# Patient Record
Sex: Female | Born: 1960 | Hispanic: Yes | Marital: Married | Smoking: Never smoker
Health system: Southern US, Community
[De-identification: ages and names within clinical notes are randomized; demographics above are authoritative.]

## PROBLEM LIST (undated history)

## (undated) DIAGNOSIS — K219 Gastro-esophageal reflux disease without esophagitis: Secondary | ICD-10-CM

## (undated) DIAGNOSIS — F32A Depression, unspecified: Secondary | ICD-10-CM

## (undated) DIAGNOSIS — E119 Type 2 diabetes mellitus without complications: Secondary | ICD-10-CM

## (undated) DIAGNOSIS — I10 Essential (primary) hypertension: Secondary | ICD-10-CM

## (undated) DIAGNOSIS — F329 Major depressive disorder, single episode, unspecified: Secondary | ICD-10-CM

## (undated) DIAGNOSIS — F419 Anxiety disorder, unspecified: Secondary | ICD-10-CM

## (undated) DIAGNOSIS — G8929 Other chronic pain: Secondary | ICD-10-CM

## (undated) HISTORY — PX: SHOULDER SURGERY: SHX246

## (undated) HISTORY — PX: WRIST SURGERY: SHX841

---

## 1990-02-08 HISTORY — PX: CHOLECYSTECTOMY: SHX55

## 2004-07-10 ENCOUNTER — Emergency Department: Payer: Self-pay | Admitting: Emergency Medicine

## 2005-03-25 ENCOUNTER — Emergency Department: Payer: Self-pay | Admitting: Emergency Medicine

## 2006-05-24 ENCOUNTER — Ambulatory Visit: Payer: Self-pay

## 2008-05-09 ENCOUNTER — Encounter: Admission: RE | Admit: 2008-05-09 | Discharge: 2008-05-09 | Payer: Self-pay | Admitting: Orthopedic Surgery

## 2010-01-27 ENCOUNTER — Emergency Department: Payer: Self-pay | Admitting: Emergency Medicine

## 2010-04-01 ENCOUNTER — Ambulatory Visit: Payer: Self-pay | Admitting: Family Medicine

## 2010-05-20 ENCOUNTER — Ambulatory Visit: Payer: Self-pay | Admitting: Pain Medicine

## 2010-06-02 ENCOUNTER — Ambulatory Visit: Payer: Self-pay | Admitting: Pain Medicine

## 2010-06-10 ENCOUNTER — Ambulatory Visit: Payer: Self-pay | Admitting: Pain Medicine

## 2010-06-17 ENCOUNTER — Ambulatory Visit: Payer: Self-pay | Admitting: Pain Medicine

## 2010-06-18 ENCOUNTER — Ambulatory Visit: Payer: Self-pay | Admitting: Pain Medicine

## 2010-06-23 ENCOUNTER — Ambulatory Visit: Payer: Self-pay | Admitting: Pain Medicine

## 2010-07-08 ENCOUNTER — Ambulatory Visit: Payer: Self-pay | Admitting: Pain Medicine

## 2010-07-24 ENCOUNTER — Ambulatory Visit: Payer: Self-pay | Admitting: Pain Medicine

## 2010-08-05 ENCOUNTER — Ambulatory Visit: Payer: Self-pay | Admitting: Pain Medicine

## 2010-08-17 ENCOUNTER — Ambulatory Visit: Payer: Self-pay | Admitting: Pain Medicine

## 2010-09-07 ENCOUNTER — Ambulatory Visit: Payer: Self-pay | Admitting: Pain Medicine

## 2010-09-28 ENCOUNTER — Ambulatory Visit: Payer: Self-pay | Admitting: Pain Medicine

## 2010-10-19 ENCOUNTER — Ambulatory Visit: Payer: Self-pay | Admitting: Pain Medicine

## 2010-12-07 ENCOUNTER — Ambulatory Visit: Payer: Self-pay | Admitting: Pain Medicine

## 2011-01-07 ENCOUNTER — Ambulatory Visit: Payer: Self-pay | Admitting: Pain Medicine

## 2011-01-14 ENCOUNTER — Ambulatory Visit: Payer: Self-pay | Admitting: Pain Medicine

## 2011-02-09 HISTORY — PX: SPINAL CORD STIMULATOR IMPLANT: SHX2422

## 2011-02-22 ENCOUNTER — Ambulatory Visit: Payer: Self-pay | Admitting: Pain Medicine

## 2011-02-23 ENCOUNTER — Ambulatory Visit: Payer: Self-pay | Admitting: Pain Medicine

## 2011-03-04 ENCOUNTER — Ambulatory Visit: Payer: Self-pay | Admitting: Pain Medicine

## 2011-03-24 ENCOUNTER — Ambulatory Visit: Payer: Self-pay | Admitting: Pain Medicine

## 2011-04-06 ENCOUNTER — Ambulatory Visit: Payer: Self-pay | Admitting: Family Medicine

## 2011-04-08 ENCOUNTER — Ambulatory Visit: Payer: Self-pay | Admitting: Pain Medicine

## 2011-04-15 ENCOUNTER — Ambulatory Visit: Payer: Self-pay | Admitting: Pain Medicine

## 2011-05-05 ENCOUNTER — Ambulatory Visit: Payer: Self-pay | Admitting: Pain Medicine

## 2011-06-23 ENCOUNTER — Ambulatory Visit: Payer: Self-pay | Admitting: Pain Medicine

## 2011-09-20 ENCOUNTER — Ambulatory Visit: Payer: Self-pay | Admitting: Pain Medicine

## 2011-12-01 ENCOUNTER — Ambulatory Visit: Payer: Self-pay | Admitting: Pain Medicine

## 2011-12-31 ENCOUNTER — Ambulatory Visit: Payer: Self-pay | Admitting: Pain Medicine

## 2012-01-03 ENCOUNTER — Ambulatory Visit: Payer: Self-pay | Admitting: Pain Medicine

## 2012-06-12 ENCOUNTER — Ambulatory Visit: Payer: Self-pay | Admitting: Pain Medicine

## 2012-06-27 ENCOUNTER — Other Ambulatory Visit: Payer: Self-pay | Admitting: Pain Medicine

## 2012-06-27 LAB — MAGNESIUM: Magnesium: 2.2 mg/dL

## 2012-06-27 LAB — BASIC METABOLIC PANEL
Chloride: 106 mmol/L (ref 98–107)
Creatinine: 0.54 mg/dL — ABNORMAL LOW (ref 0.60–1.30)
EGFR (Non-African Amer.): >60
Osmolality: 278 (ref 275–301)

## 2012-11-10 ENCOUNTER — Ambulatory Visit: Payer: Self-pay | Admitting: Pain Medicine

## 2012-11-13 ENCOUNTER — Other Ambulatory Visit: Payer: Self-pay | Admitting: Pain Medicine

## 2012-11-13 LAB — BASIC METABOLIC PANEL
BUN: 11 mg/dL (ref 7–18)
Chloride: 104 mmol/L (ref 98–107)
Creatinine: 0.73 mg/dL (ref 0.60–1.30)
EGFR (Non-African Amer.): >60
Potassium: 4.6 mmol/L (ref 3.5–5.1)
Sodium: 137 mmol/L (ref 136–145)

## 2012-11-13 LAB — MAGNESIUM: Magnesium: 2.2 mg/dL

## 2012-11-16 ENCOUNTER — Ambulatory Visit: Payer: Self-pay | Admitting: Pain Medicine

## 2012-11-20 ENCOUNTER — Ambulatory Visit: Payer: Self-pay

## 2012-12-11 ENCOUNTER — Ambulatory Visit: Payer: Self-pay | Admitting: Pain Medicine

## 2013-01-16 ENCOUNTER — Ambulatory Visit: Payer: Self-pay | Admitting: Pain Medicine

## 2013-03-12 ENCOUNTER — Ambulatory Visit: Payer: Self-pay | Admitting: Pain Medicine

## 2013-03-20 ENCOUNTER — Ambulatory Visit: Payer: Self-pay | Admitting: Pain Medicine

## 2013-03-22 ENCOUNTER — Ambulatory Visit: Payer: Self-pay | Admitting: Pain Medicine

## 2013-04-09 ENCOUNTER — Ambulatory Visit: Payer: Self-pay | Admitting: Pain Medicine

## 2013-04-17 ENCOUNTER — Ambulatory Visit: Payer: Self-pay | Admitting: Pain Medicine

## 2013-05-04 ENCOUNTER — Ambulatory Visit: Payer: Self-pay | Admitting: Pain Medicine

## 2013-05-22 ENCOUNTER — Ambulatory Visit: Payer: Self-pay | Admitting: Pain Medicine

## 2013-06-04 ENCOUNTER — Ambulatory Visit: Payer: Self-pay | Admitting: Pain Medicine

## 2013-06-27 ENCOUNTER — Ambulatory Visit: Payer: Self-pay | Admitting: Pain Medicine

## 2013-08-31 ENCOUNTER — Ambulatory Visit: Payer: Self-pay | Admitting: Pain Medicine

## 2013-12-21 ENCOUNTER — Ambulatory Visit: Payer: Self-pay | Admitting: Pain Medicine

## 2014-01-14 ENCOUNTER — Ambulatory Visit: Payer: Self-pay

## 2014-06-02 NOTE — Op Note (Signed)
PATIENT NAME:  Alyssa Wood, Alyssa Wood MR#:  [id number] DATE OF BIRTH:  Jul 05, 1960  ACCOUNT NUMBER: [id number]   DATE OF PROCEDURE:  02/23/2011  Location: Operating Room Referring Physician: Maia Breslow, MD  Primary Care Physician: Baltazar Apo, MD  Procedure by: Kathlen Brunswick Dossie Arbour, MD   Caseworker: Lucius Conn, RN, Fax [phone]  Note: This is the case of a 54 year old Hispanic female patient with a diagnosis of a right upper extremity complex regional pain syndrome who comes into the hospital today for placement of a permanent right-sided cervical spinal cord stimulator after having had a successful trial.    Procedure(s):  1. Permanent implantation of Cervical Epidural, Single Percutaneous Neurostimulator Lead (8 electrode array). 2. Implantation of Epidural Neurostimulator Generator. 3. Fluoroscopic Needle Guidance 4. Intraoperative Analysis and Programming. 5. Postoperative Analysis and Programming. 6. Moderate Conscious Sedation by the [facility] Anesthesia Team.  Surgeon: Kathlen Brunswick. Dossie Arbour, M.D. Side of implant: Right side Top electrode tip level: C2  Diagnostic Indications: Right upper extremity secondary to a complex regional pain syndrome  Position: Prone.  Prepping solution: DuraPrep Area prepped: The Cervical, thoracic, and lumbosacral areas, were prepped with a broad-spectrum topical antiseptic microbicide. Target area: Cervical Epidural Space, around the C2/C3 vertebral body, for the electrode tip. Insertion site is the T1-3 intervertebral space. Level entered: T2-3 Number of attempts: one  Infection Control: Standard Universal Precautions taken (Respiratory Hygiene/Cough Etiquette; Mouth, nose, eye protection; Hand Hygiene; Personal protective equipment (PPE); safe injection practices; and use of masks and disposable sterile surgical gloves) as recommended by the Department of [place] for Disease Control and Prevention (CDC).  Safety Measures:  Allergies were reviewed. Appropriate site, procedure, and patient were confirmed by following the Joint Commission's Universal Protocol (UP.01.01.01). The patient was asked to confirm marked site and procedure, before commencing. The patient was asked about blood thinners, or active infections, both of which were denied. No attempt was made at seeking any paresthesias. Aspiration looking for blood return was conducted prior to injecting. At no point did we inject any substances, as a needle was being advanced.  Pre-procedure Assessment:  A medical history and physical exam were obtained. Relevant documentation was reviewed and verified. Prior to the procedure, the patient was provided with an Audio CD, as well as written information on the procedure, including side-effects, and possible complications. Under the influence of no sedatives, a verbal, as well as a written informed consent were obtained, after having provided information on the risks and possible complications. To fulfill our ethical and legal obligations, as recommended by the American Medical Association's Code of Ethics, we have provided information to the patient about our clinical impression; the nature and purpose of an available treatment or procedure; the risks and benefits of an available treatment or procedure; alternatives; the risk and benefits of the alternative treatment or procedure; and the risks and benefits of not receiving or undergoing a treatment or procedure. The patient was provided information about the risks and possible complications associated with the procedure. These include, but not limited to, failure to achieve desired goals, infection, bleeding, organ or nerve damage, allergic reactions, paralysis, and death. In addition, the patient was informed that Medicine is not an exact science; therefore, there is also the possibility of unforeseen risks and possible complications that may result in a catastrophic outcome. The  patient indicated having understood very clearly.  We have given the patient no guarantees and we have made no promises. Ample time was given to the patient to ask  questions, all of which were answered, to the patient's satisfaction, before proceeding. The patient understands that by signing our informed consent form, they understand and accept the risks and the fact that it is impossible to predict all possible complications. Baseline vital signs were taken and the medical assessment was completed. Verification of the correct person, correct site (including marking of site), and correct procedure were performed and confirmed by the patient. Baseline vital signs were taken and the initial assessment was completed. Verification of the correct person, correct site (including marking of site), and correct procedure were performed and confirmed by the patient, in the form of a "Time Out".  Monitoring: The patient was monitored in the usual manner, using NIBPM, ECG, and pulse oximetry.  IV Access:  An IV access was obtained and secured.  Analgesia:  Moderate (Conscious) Intravenous sedation: Consent was obtained before administering any sedation. Availability of a responsible, adult driver, and NPO status confirmed. Meaningful verbal contact was maintained, with the patient at all times during the procedure. ASA Sedation Guidelines followed.  Prophylactic Antibiotics: Cefazolin (1st generation cephalosporin) 1 gm IVPB.  Local Anesthesia: Lidocaine 1%. The skin over the procedure site were infiltrated using a 3 ml Luer-Lok syringe with a 0.5 inch, 25-G needle. Deeper tissues were infiltrated using a 3.0 inch, 22-G spinal needle, under fluoroscopic guidance.  Fluoroscopy: The patient was taken to the operative suite, where the patient was placed in position for the procedure, over the fluoroscopy compatible table. Fluoroscopy was manipulated, using "Tunnel Vision Technique", to obtain the best possible view  of the target area, on the affected side. Parallax error was corrected before commencing the procedure. Gabor Racz's "Direction-Depth-Direction" technique was used to introduce the procedural needle under continuous pulsed fluoroscopic guidance. Once the target was reached, antero-posterior and lateral fluoroscopic views were taken to confirm needle placement in two planes. Fluoroscopy time: Please see the patient's chart for details.  Description of the procedure: The procedure site was prepped using a broad-spectrum topical antiseptic. The area was then draped in the usual and standard manner. "Time-out" was performed as per JC Universal Protocol (UP.01.01.01).   A midline incision was made over the spinous processes, and hemostasis attained. The procedural needle was then introduced through the incision using Gabor Racz's "Direction-Depth-Direction" technique, under pulsed fluoroscopic guidance. No attempt was made at seeking a paresthesia. The paramidline approach was used to enter the posterior Cervical epidural space at a 30 degree angle, using "Loss-of-resistance Technique" with 3 ml of PF-NaCl (0.9% NSS) + 0.5 ml of air, in a 5 ml glass syringe, using a "loss-of-bounce technique", at the desired level. Correct needle placement was confirmed in the  antero-posterior and lateral fluoroscopic views. The epidural lead was gently introduced under real-time fluoroscopy, constantly assessing for pain or paresthesias, until the tip was observed to be at the target level, on the side ipsilateral to the pain. Once the target was thought to have been reached, antero-posterior and lateral fluoroscopic views were taken to confirm electrode placement in two planes. Placement was tested until a comfortable stimulation pattern was observed over the usual painful area. Once the patient had assured [place] that the stimulation was in the correct pattern, and distribution, we proceeded to remove the 15-G "Tuohy" epidural needle.  This was done while observing the electrode tip under real-time fluoroscopy to prevent movement. The patient was sedated again, and 1% lidocaine was infiltrated into the buttocks area, in order to create the generator pocket. The extension was tunneled and connected  to the generator. An impedance check was conducted after connecting all sections of the system. Once hemostasis was confirmed, both wounds were closed with Vicryl 2-0 after cleaning them with a solution containing 50:50 hydrogen peroxide and Betadine. Surgical staples were used to close the skin. The wounds were covered with sterile transparent bio-occlusive dressings, to easily assess any evidence of infection in the future.  The patient tolerated the entire procedure well. A repeat set of vitals were taken after the procedure and the patient was kept under observation until discharge criteria was met. The patient was provided with discharge instructions, including a section on how to identify potential problems. Should any problems arise concerning this procedure, the patient was given instructions to immediately contact us, without hesitation. The neurostimulator representative and I, both provided the patient with our Business cards containing our contact telephone numbers, and instructed the patient to contact either one of [place], at any time, should there be any problems or questions. In any case, we plan to contact the patient by telephone for a follow-up status report regarding this interventional procedure.  EBL: 20 mL   Complications: No heme; no paresthesias.  Disposition: Return to clinics in 10-11 days for removal of staples and postoperative evaluation.  Additional Comments/Plan: None.  Equipment used:   1. [employer] Model [phone] (Lead Kit), Lot #[id number]  2. [employer] Accessory Kit Model [id number], Lot [id number]  3. [employer], [place] #97791, Lot #[id number]  1. [employer] Accessory Kit Model [id number], Lot  #[id number]  1. [place] [id number], Serial [id number]  6. [employer] Patient [place] [phone], Serial [id number]   Disclaimer: Medicine is not an Chief Strategy Officer. The only guarantee in medicine is that nothing is guaranteed. It is important to note that the decision to proceed with this intervention was based on the information collected from the patient. The Data and conclusions were drawn from the patient's questionnaire, the interview, and the physical examination. Because the information was provided in large part by the patient, it cannot be guaranteed that it has not been purposely or unconsciously manipulated. Every effort has been made to obtain as much relevant data as possible for this evaluation. It is important to note that the conclusions that lead to this procedure are derived in large part from the available data. Always take into account that the treatment will also be dependent on availability of resources and existing treatment guidelines, considered by other Pain Management Practitioners as being common knowledge and practice, at this time. For Medico-Legal purposes, it is also important to point out that variations in procedural techniques and pharmacological choices are the acceptable norm. The indications, contraindications, technique, and results of the above procedure should only be interpreted and judged by a Board-Certified Interventional Pain Specialist with extensive familiarity and expertise in the same exact procedure and technique, doing otherwise would be inappropriate and unethical.   ____________________________ Kathlen Brunswick. Dossie Arbour, MD fan:drc D: 02/24/2011 16:10:53 ET T: 02/24/2011 16:29:35 ET JOB#: [id number]  cc: Karren Newland A. Dossie Arbour, MD, <Dictator> Vernell Leep, MD Caseworker, Lucius Conn, RN, Fax [phone] Gaspar Cola MD ELECTRONICALLY SIGNED 02/25/2011 6:46

## 2015-01-29 ENCOUNTER — Ambulatory Visit: Payer: Self-pay | Admitting: Pain Medicine

## 2015-07-29 ENCOUNTER — Telehealth: Payer: Self-pay | Admitting: Gastroenterology

## 2015-07-29 NOTE — Telephone Encounter (Signed)
colonoscopy

## 2015-08-19 ENCOUNTER — Other Ambulatory Visit: Payer: Self-pay

## 2015-08-19 ENCOUNTER — Telehealth: Payer: Self-pay

## 2015-08-19 MED ORDER — NA SULFATE-K SULFATE-MG SULF 17.5-3.13-1.6 GM/177ML PO SOLN: 1.0000 | Freq: Once | ORAL | Status: DC

## 2015-08-19 NOTE — Telephone Encounter (Signed)
Gastroenterology Pre-Procedure Review  Request Date: 08/28/2015 Requesting Physician: Dr. Allen Norris    PATIENT REVIEW QUESTIONS: The patient responded to the following health history questions as indicated:    1. Are you having any GI issues? no 2. Do you have a personal history of Polyps? yes (unkown type) 3. Do you have a family history of Colon Cancer or Polyps? no 4. Diabetes Mellitus? no 5. Joint replacements in the past 12 months?no 6. Major health problems in the past 3 months?no 7. Any artificial heart valves, MVP, or defibrillator?no    MEDICATIONS & ALLERGIES:    Patient reports the following regarding taking any anticoagulation/antiplatelet therapy:   Plavix, Coumadin, Eliquis, Xarelto, Lovenox, Pradaxa, Brilinta, or Effient? no Aspirin? no  Patient confirms/reports the following medications:  Current Outpatient Prescriptions  Medication Sig Dispense Refill  . calcium-vitamin D (OSCAL WITH D) 500-200 MG-UNIT tablet Take 1 tablet by mouth.    [name] lisinopril-hydrochlorothiazide (PRINZIDE,ZESTORETIC) 20-12.5 MG tablet Take 1 tablet by mouth daily.    . methylphenidate (RITALIN) 5 MG tablet Take 5 mg by mouth 2 (two) times daily.    . nortriptyline (PAMELOR) 10 MG capsule Take 10 mg by mouth at bedtime.    [name] omeprazole (PRILOSEC) 20 MG capsule Take 20 mg by mouth daily.    . ondansetron (ZOFRAN) 4 MG tablet Take 4 mg by mouth every 8 (eight) hours as needed for nausea or vomiting.     No current facility-administered medications for this visit.    Patient confirms/reports the following allergies:  Allergies  Allergen Reactions  . Trazodone And Nefazodone Anaphylaxis  . Hydrocodone Nausea And Vomiting    No orders of the defined types were placed in this encounter.    AUTHORIZATION INFORMATION Primary Insurance: 1D#: Group #:  Secondary Insurance: 1D#: Group #:  SCHEDULE INFORMATION: Date: 08/28/2015 Time: Location: [facility]

## 2015-08-19 NOTE — Telephone Encounter (Signed)
Scheduled Colonoscopy at [facility] 08/28/2015. Personal history of colonic polyps Z86.010. Please pre-cert.

## 2015-08-21 ENCOUNTER — Encounter: Payer: Self-pay | Admitting: *Deleted

## 2015-08-27 NOTE — Discharge Instructions (Signed)
Anestesia general, adultos, cuidados posteriores °(General Anesthesia, Adult, Care After) °Siga estas instrucciones durante las próximas semanas. Estas indicaciones le proporcionan información acerca de cómo deberá cuidarse después del procedimiento. El médico también podrá darle instrucciones más específicas. El tratamiento se ha planificado de acuerdo a las prácticas médicas actuales, pero a veces se producen problemas. Comuníquese con el médico si tiene algún problema o tiene dudas después del procedimiento. °QUÉ ESPERAR DESPUÉS DEL PROCEDIMIENTO °Después del procedimiento es habitual experimentar: °· Somnolencia. °· Náuseas y vómitos. °INSTRUCCIONES PARA EL CUIDADO EN EL HOGAR °· Durante las primeras 24 horas luego de la anestesia general: °¨ Haga que una persona responsable se quede con usted. °¨ No conduzca un automóvil. Si está solo, no viaje en transporte público. °¨ No beba alcohol. °¨ No tome medicamentos que no le haya recetado su médico. °¨ No firme documentos importantes ni tome decisiones trascendentes. °¨ Puede reanudar su dieta y sus actividades normales según le haya indicado el médico. °· Cambie los vendajes (apósitos) tal como se le indicó. °· Si tiene preguntas o se le presenta algún problema relacionado con la anestesia general, comuníquese con el hospital y pida por el anestesista o anestesiólogo de guardia. °SOLICITE ATENCIÓN MÉDICA SI: °· Tiene náuseas y vómitos durante el día posterior a la anestesia. °· Le aparece una erupción cutánea. °SOLICITE ATENCIÓN MÉDICA DE INMEDIATO SI:  °· Tiene dificultad para respirar. °· Siente dolor en el pecho. °· Tiene algún problema alérgico. °  °Esta información no tiene como fin reemplazar el consejo del médico. Asegúrese de hacerle al médico cualquier pregunta que tenga. °  °Document Released: 01/25/2005 Document Revised: 02/15/2014 °Elsevier Interactive Patient Education ©2016 Elsevier Inc. ° °

## 2015-08-28 ENCOUNTER — Ambulatory Visit
Admission: RE | Admit: 2015-08-28 | Discharge: 2015-08-28 | Disposition: A | Discharge status: Home / Self Care | Payer: Medicare Other | Source: Ambulatory Visit | Attending: Gastroenterology | Admitting: Gastroenterology

## 2015-08-28 ENCOUNTER — Encounter: Payer: Self-pay | Admitting: *Deleted

## 2015-08-28 ENCOUNTER — Ambulatory Visit: Payer: Medicare Other | Admitting: Student in an Organized Health Care Education/Training Program

## 2015-08-28 ENCOUNTER — Encounter
Admission: RE | Disposition: A | Discharge status: Home / Self Care | Payer: Self-pay | Source: Ambulatory Visit | Attending: Gastroenterology

## 2015-08-28 DIAGNOSIS — K219 Gastro-esophageal reflux disease without esophagitis: Secondary | ICD-10-CM | POA: Insufficient documentation

## 2015-08-28 DIAGNOSIS — Z885 Allergy status to narcotic agent status: Secondary | ICD-10-CM | POA: Insufficient documentation

## 2015-08-28 DIAGNOSIS — Z860100 Personal history of colon polyps, unspecified: Secondary | ICD-10-CM | POA: Insufficient documentation

## 2015-08-28 DIAGNOSIS — Z79899 Other long term (current) drug therapy: Secondary | ICD-10-CM | POA: Insufficient documentation

## 2015-08-28 DIAGNOSIS — Z1211 Encounter for screening for malignant neoplasm of colon: Secondary | ICD-10-CM | POA: Insufficient documentation

## 2015-08-28 DIAGNOSIS — K641 Second degree hemorrhoids: Secondary | ICD-10-CM | POA: Diagnosis not present

## 2015-08-28 DIAGNOSIS — F419 Anxiety disorder, unspecified: Secondary | ICD-10-CM | POA: Insufficient documentation

## 2015-08-28 DIAGNOSIS — D123 Benign neoplasm of transverse colon: Secondary | ICD-10-CM | POA: Diagnosis not present

## 2015-08-28 DIAGNOSIS — D125 Benign neoplasm of sigmoid colon: Secondary | ICD-10-CM | POA: Insufficient documentation

## 2015-08-28 DIAGNOSIS — I10 Essential (primary) hypertension: Secondary | ICD-10-CM | POA: Diagnosis not present

## 2015-08-28 HISTORY — DX: Anxiety disorder, unspecified: F41.9

## 2015-08-28 HISTORY — DX: Gastro-esophageal reflux disease without esophagitis: K21.9

## 2015-08-28 HISTORY — DX: Other chronic pain: G89.29

## 2015-08-28 HISTORY — PX: COLONOSCOPY WITH PROPOFOL: SHX5780

## 2015-08-28 HISTORY — DX: Essential (primary) hypertension: I10

## 2015-08-28 HISTORY — PX: POLYPECTOMY: SHX5525

## 2015-08-28 SURGERY — COLONOSCOPY WITH PROPOFOL
Anesthesia: Monitor Anesthesia Care | Wound class: Contaminated

## 2015-08-28 MED ORDER — LIDOCAINE HCL (CARDIAC) 20 MG/ML IV SOLN
INTRAVENOUS | Status: DC | PRN
  Administered 2015-08-28: 50 mg via INTRAVENOUS

## 2015-08-28 MED ORDER — ONDANSETRON HCL 4 MG/2ML IJ SOLN: 4.0000 mg | Freq: Once | INTRAMUSCULAR | Status: DC | PRN

## 2015-08-28 MED ORDER — PROPOFOL 10 MG/ML IV BOLUS
INTRAVENOUS | Status: DC | PRN
  Administered 2015-08-28: 60 mg via INTRAVENOUS
  Administered 2015-08-28 (×3): 20 mg via INTRAVENOUS
  Administered 2015-08-28: 30 mg via INTRAVENOUS
  Administered 2015-08-28: 20 mg via INTRAVENOUS

## 2015-08-28 MED ORDER — ACETAMINOPHEN 160 MG/5ML PO SOLN: 325.0000 mg | ORAL | Status: DC | PRN

## 2015-08-28 MED ORDER — ACETAMINOPHEN 325 MG PO TABS: 325.0000 mg | ORAL_TABLET | ORAL | Status: DC | PRN

## 2015-08-28 MED ORDER — LACTATED RINGERS IV SOLN
INTRAVENOUS | Status: DC
  Administered 2015-08-28: 09:00:00 via INTRAVENOUS

## 2015-08-28 MED ORDER — STERILE WATER FOR IRRIGATION IR SOLN
Status: DC | PRN
  Administered 2015-08-28: 09:00:00

## 2015-08-28 SURGICAL SUPPLY — 23 items
CANISTER SUCT 1200ML W/VALVE (MISCELLANEOUS) ×4 IMPLANT
CLIP HMST 235XBRD CATH ROT (MISCELLANEOUS) IMPLANT
CLIP RESOLUTION 360 11X235 (MISCELLANEOUS)
FCP ESCP3.2XJMB 240X2.8X (MISCELLANEOUS)
FORCEPS BIOP RAD 4 LRG CAP 4 (CUTTING FORCEPS) ×4 IMPLANT
FORCEPS BIOP RJ4 240 W/NDL (MISCELLANEOUS)
FORCEPS ESCP3.2XJMB 240X2.8X (MISCELLANEOUS) IMPLANT
GOWN CVR UNV OPN BCK APRN NK (MISCELLANEOUS) ×4 IMPLANT
GOWN ISOL THUMB LOOP REG UNIV (MISCELLANEOUS) ×4
INJECTOR VARIJECT VIN23 (MISCELLANEOUS) IMPLANT
KIT DEFENDO VALVE AND CONN (KITS) IMPLANT
KIT ENDO PROCEDURE OLY (KITS) ×4 IMPLANT
MARKER SPOT ENDO TATTOO 5ML (MISCELLANEOUS) IMPLANT
PAD GROUND ADULT SPLIT (MISCELLANEOUS) IMPLANT
PROBE APC STR FIRE (PROBE) IMPLANT
RETRIEVER NET ROTH 2.5X230 LF (MISCELLANEOUS) ×4 IMPLANT
SNARE SHORT THROW 13M SML OVAL (MISCELLANEOUS) IMPLANT
SNARE SHORT THROW 30M LRG OVAL (MISCELLANEOUS) IMPLANT
SNARE SNG USE RND 15MM (INSTRUMENTS) IMPLANT
SPOT EX ENDOSCOPIC TATTOO (MISCELLANEOUS)
TRAP ETRAP POLY (MISCELLANEOUS) IMPLANT
VARIJECT INJECTOR VIN23 (MISCELLANEOUS)
WATER STERILE IRR 250ML POUR (IV SOLUTION) ×4 IMPLANT

## 2015-08-28 NOTE — Transfer of Care (Signed)
Immediate Anesthesia Transfer of Care Note  Patient: Alyssa Wood  Procedure(s) Performed: Procedure(s) with comments: COLONOSCOPY WITH PROPOFOL (N/A) - PER PT PLEASE LEAVE LAST NEEDS INTERPRETER POLYPECTOMY  Patient Location: PACU  Anesthesia Type: MAC  Level of Consciousness: awake, alert  and patient cooperative  Airway and Oxygen Therapy: Patient Spontanous Breathing and Patient connected to supplemental oxygen  Post-op Assessment: Post-op Vital signs reviewed, Patient's Cardiovascular Status Stable, Respiratory Function Stable, Patent Airway and No signs of Nausea or vomiting  Post-op Vital Signs: Reviewed and stable  Complications: No apparent anesthesia complications

## 2015-08-28 NOTE — Anesthesia Preprocedure Evaluation (Signed)
Anesthesia Evaluation  Patient identified by MRN, date of birth, ID band Patient awake    Reviewed: Allergy & Precautions, NPO status , Patient's Chart, lab work & pertinent test results  Airway Mallampati: II  TM Distance: >3 FB Neck ROM: Full    Dental no notable dental hx.    Pulmonary neg pulmonary ROS,    Pulmonary exam normal        Cardiovascular hypertension, Pt. on medications Normal cardiovascular exam     Neuro/Psych Anxiety negative neurological ROS     GI/Hepatic Neg liver ROS, GERD  Medicated and Controlled,  Endo/Other  negative endocrine ROS  Renal/GU negative Renal ROS     Musculoskeletal negative musculoskeletal ROS (+)   Abdominal   Peds  Hematology negative hematology ROS (+)   Anesthesia Other Findings   Reproductive/Obstetrics                             Anesthesia Physical Anesthesia Plan  ASA: II  Anesthesia Plan: MAC   Post-op Pain Management:    Induction: Intravenous  Airway Management Planned:   Additional Equipment:   Intra-op Plan:   Post-operative Plan:   Informed Consent: I have reviewed the patients History and Physical, chart, labs and discussed the procedure including the risks, benefits and alternatives for the proposed anesthesia with the patient or authorized representative who has indicated his/her understanding and acceptance.     Plan Discussed with: CRNA  Anesthesia Plan Comments:         Anesthesia Quick Evaluation

## 2015-08-28 NOTE — H&P (Signed)
Alyssa Lame, MD [address] [place], [place] [address] Phone: [phone] Fax : [phone]  Primary Care Physician:  [facility] Primary Gastroenterologist:  Dr. Allen Norris  Pre-Procedure History & Physical: HPI:  Alyssa Wood is a 55 y.o. female is here for an colonoscopy.   Past Medical History  Diagnosis Date  . Hypertension   . GERD (gastroesophageal reflux disease)   . Chronic pain   . Anxiety     Past Surgical History  Procedure Laterality Date  . Cholecystectomy  1992  . Spinal cord stimulator implant Right Jan 2013  . Shoulder surgery Right   . Wrist surgery Right     right hand and wrist reconstruction    Prior to Admission medications   Medication Sig Start Date End Date Taking? Authorizing Provider  calcium-vitamin D (OSCAL WITH D) 500-200 MG-UNIT tablet Take 1 tablet by mouth.   Yes Historical Provider, MD  Cyanocobalamin (VITAMIN B 12 PO) Take 1 tablet by mouth daily.   Yes Historical Provider, MD  lisinopril-hydrochlorothiazide (PRINZIDE,ZESTORETIC) 20-12.5 MG tablet Take 1 tablet by mouth daily.    Yes Historical Provider, MD  Na Sulfate-K Sulfate-Mg Sulf (SUPREP BOWEL PREP KIT) 17.5-3.13-1.6 GM/180ML SOLN Take 1 Dose by mouth once. Please use bowel prep instructions provided 08/19/15  Yes Alyssa Lame, MD  nortriptyline (PAMELOR) 10 MG capsule Take 10 mg by mouth at bedtime.   Yes Historical Provider, MD  omeprazole (PRILOSEC) 20 MG capsule Take 20 mg by mouth 2 (two) times daily.    Yes Historical Provider, MD  ondansetron (ZOFRAN) 4 MG tablet Take 4 mg by mouth every 8 (eight) hours as needed for nausea or vomiting.   Yes Historical Provider, MD  methylphenidate (RITALIN) 5 MG tablet Take 5 mg by mouth 2 (two) times daily. Reported on 08/21/2015    Historical Provider, MD    Allergies as of 08/19/2015 - never reviewed  Allergen Reaction Noted  . Trazodone and nefazodone Anaphylaxis 08/19/2015  . Hydrocodone Nausea And  Vomiting 08/19/2015    History reviewed. No pertinent family history.  Social History   Social History  . Marital Status: Married    Spouse Name: N/A  . Number of Children: N/A  . Years of Education: N/A   Occupational History  . Not on file.   Social History Main Topics  . Smoking status: Never Smoker   . Smokeless tobacco: Never Used  . Alcohol Use: No  . Drug Use: No  . Sexual Activity: Not on file   Other Topics Concern  . Not on file   Social History Narrative    Review of Systems: See HPI, otherwise negative ROS  Physical Exam: BP 125/79 mmHg  Pulse 71  Temp(Src) 97.7 F (36.5 C)  Resp 16  Ht 5' 0.06" (1.526 m)  Wt 146 lb (66.225 kg)  BMI 28.44 kg/m2  SpO2 97% General:   Alert,  pleasant and cooperative in NAD Head:  Normocephalic and atraumatic. Neck:  Supple; no masses or thyromegaly. Lungs:  Clear throughout to auscultation.    Heart:  Regular rate and rhythm. Abdomen:  Soft, nontender and nondistended. Normal bowel sounds, without guarding, and without rebound.   Neurologic:  Alert and  oriented x4;  grossly normal neurologically.  Impression/Plan: [name] is here for an colonoscopy to be performed for history of polyps  Risks, benefits, limitations, and alternatives regarding  colonoscopy have been reviewed with the patient.  Questions have been answered.  All parties agreeable.  Alyssa Lame, MD  08/28/2015, 9:07 AM

## 2015-08-28 NOTE — Anesthesia Procedure Notes (Signed)
Procedure Name: MAC Performed by: Kelin Borum Pre-anesthesia Checklist: Patient identified, Emergency Drugs available, Suction available, Timeout performed and Patient being monitored Patient Re-evaluated:Patient Re-evaluated prior to inductionOxygen Delivery Method: Nasal cannula Placement Confirmation: positive ETCO2       

## 2015-08-28 NOTE — Op Note (Signed)
[facility] Gastroenterology Patient Name: Alyssa Wood Procedure Date: 08/28/2015 9:05 AM MRN: [id number] Account #: [id number] Date of Birth: 1960-03-19 Admit Type: Outpatient Age: 55 Room: [facility] OR ROOM 01 Gender: Female Note Status: Finalized Procedure:            Colonoscopy Indications:          High risk colon cancer surveillance: Personal history                        of colonic polyps Providers:            [name] MD, MD Referring MD:         [facility] (Referring MD) Medicines:            Propofol per Anesthesia Complications:        No immediate complications. Procedure:            Pre-Anesthesia Assessment:                       - Prior to the procedure, a History and Physical was                        performed, and patient medications and allergies were                        reviewed. The patient's tolerance of previous                        anesthesia was also reviewed. The risks and benefits of                        the procedure and the sedation options and risks were                        discussed with the patient. All questions were                        answered, and informed consent was obtained. Prior                        Anticoagulants: The patient has taken no previous                        anticoagulant or antiplatelet agents. ASA Grade                        Assessment: II - A patient with mild systemic disease.                        After reviewing the risks and benefits, the patient was                        deemed in satisfactory condition to undergo the                        procedure.                       After obtaining informed consent, the colonoscope was  passed under direct vision. Throughout the procedure,                        the patient's blood pressure, pulse, and oxygen                        saturations were monitored continuously. The Olympus CF                         H180AL colonoscope (S#: [id number]) was introduced through                        the anus and advanced to the the cecum, identified by                        appendiceal orifice and ileocecal valve. The                        colonoscopy was performed without difficulty. The                        patient tolerated the procedure well. The quality of                        the bowel preparation was poor. Findings:      The perianal and digital rectal examinations were normal.      A 3 mm polyp was found in the transverse colon. The polyp was sessile.       The polyp was removed with a cold biopsy forceps. Resection and       retrieval were complete.      Two sessile polyps were found in the sigmoid colon. The polyps were 4 to       5 mm in size. These polyps were removed with a cold biopsy forceps.       Resection and retrieval were complete.      Non-bleeding internal hemorrhoids were found during retroflexion. The       hemorrhoids were Grade II (internal hemorrhoids that prolapse but reduce       spontaneously). Impression:           - Preparation of the colon was poor.                       - One 3 mm polyp in the transverse colon, removed with                        a cold biopsy forceps. Resected and retrieved.                       - Two 4 to 5 mm polyps in the sigmoid colon, removed                        with a cold biopsy forceps. Resected and retrieved.                       - Non-bleeding internal hemorrhoids. Recommendation:       - Repeat colonoscopy in 5 years for surveillance. Procedure Code(s):    --- Professional ---  45380, Colonoscopy, flexible; with biopsy, single or                        multiple Diagnosis Code(s):    --- Professional ---                       Z86.010, Personal history of colonic polyps                       D12.3, Benign neoplasm of transverse colon (hepatic                        flexure or splenic  flexure)                       D12.5, Benign neoplasm of sigmoid colon CPT copyright 2016 [employer]. All rights reserved. The codes documented in this report are preliminary and upon coder review may  be revised to meet current compliance requirements. [name] MD, MD 08/28/2015 9:36:15 AM This report has been signed electronically. Number of Addenda: 0 Note Initiated On: 08/28/2015 9:05 AM Scope Withdrawal Time: 0 hours 6 minutes 21 seconds  Total Procedure Duration: 0 hours 9 minutes 32 seconds       [facility]

## 2015-08-28 NOTE — Anesthesia Postprocedure Evaluation (Signed)
Anesthesia Post Note  Patient: Alyssa Wood  Procedure(s) Performed: Procedure(s) (LRB): COLONOSCOPY WITH PROPOFOL (N/A) POLYPECTOMY  Patient location during evaluation: PACU Anesthesia Type: MAC Level of consciousness: awake and alert and oriented Pain management: pain level controlled Vital Signs Assessment: post-procedure vital signs reviewed and stable Respiratory status: spontaneous breathing and nonlabored ventilation Cardiovascular status: stable Postop Assessment: no signs of nausea or vomiting and adequate PO intake Anesthetic complications: no    Estill Batten

## 2015-08-29 ENCOUNTER — Encounter: Payer: Self-pay | Admitting: Gastroenterology

## 2015-09-02 ENCOUNTER — Encounter: Payer: Self-pay | Admitting: Gastroenterology

## 2015-09-03 ENCOUNTER — Encounter: Payer: Self-pay | Admitting: Gastroenterology

## 2015-12-15 ENCOUNTER — Other Ambulatory Visit: Payer: Self-pay | Admitting: Family Medicine

## 2015-12-15 DIAGNOSIS — Z0000 Encounter for general adult medical examination without abnormal findings: Secondary | ICD-10-CM

## 2016-01-16 ENCOUNTER — Ambulatory Visit
Admission: RE | Admit: 2016-01-16 | Discharge: 2016-01-16 | Disposition: A | Discharge status: Home / Self Care | Payer: Medicare Other | Source: Ambulatory Visit | Attending: Family Medicine | Admitting: Family Medicine

## 2016-01-16 DIAGNOSIS — Z1231 Encounter for screening mammogram for malignant neoplasm of breast: Secondary | ICD-10-CM | POA: Insufficient documentation

## 2016-01-16 DIAGNOSIS — Z0000 Encounter for general adult medical examination without abnormal findings: Secondary | ICD-10-CM

## 2016-12-11 ENCOUNTER — Emergency Department
Admission: EM | Admit: 2016-12-11 | Discharge: 2016-12-12 | Disposition: A | Discharge status: Home / Self Care | Payer: Medicare Other | Attending: Emergency Medicine | Admitting: Emergency Medicine

## 2016-12-11 DIAGNOSIS — I10 Essential (primary) hypertension: Secondary | ICD-10-CM | POA: Diagnosis not present

## 2016-12-11 DIAGNOSIS — G8929 Other chronic pain: Secondary | ICD-10-CM | POA: Diagnosis not present

## 2016-12-11 DIAGNOSIS — R519 Headache, unspecified: Secondary | ICD-10-CM

## 2016-12-11 DIAGNOSIS — Z79899 Other long term (current) drug therapy: Secondary | ICD-10-CM | POA: Diagnosis not present

## 2016-12-11 DIAGNOSIS — R51 Headache: Secondary | ICD-10-CM | POA: Diagnosis not present

## 2016-12-11 NOTE — ED Notes (Signed)
Per Dr Burlene Arnt no labs or imagining at this time.

## 2016-12-11 NOTE — ED Triage Notes (Addendum)
Pt states that she has severe migraine and nausea that started at 5pm - pt fell asleep during assessment but is stating 10/10 pain - she talks about argument with her husband repeatedly as the reason for the headache

## 2016-12-12 ENCOUNTER — Emergency Department: Payer: Medicare Other

## 2016-12-12 LAB — CBC
HCT: 40 % (ref 35.0–47.0)
HEMOGLOBIN: 13.4 g/dL (ref 12.0–16.0)
MCH: 27.8 pg (ref 26.0–34.0)
MCHC: 33.4 g/dL (ref 32.0–36.0)
MCV: 83.5 fL (ref 80.0–100.0)
Platelets: 242 10*3/uL (ref 150–440)
RBC: 4.8 MIL/uL (ref 3.80–5.20)
RDW: 13.4 % (ref 11.5–14.5)
WBC: 7.9 10*3/uL (ref 3.6–11.0)

## 2016-12-12 LAB — COMPREHENSIVE METABOLIC PANEL
ALT: 40 U/L (ref 14–54)
AST: 38 U/L (ref 15–41)
Albumin: 4.2 g/dL (ref 3.5–5.0)
Alkaline Phosphatase: 103 U/L (ref 38–126)
Anion gap: 9 (ref 5–15)
BUN: 13 mg/dL (ref 6–20)
CHLORIDE: 104 mmol/L (ref 101–111)
CO2: 24 mmol/L (ref 22–32)
CREATININE: 0.57 mg/dL (ref 0.44–1.00)
Calcium: 9.2 mg/dL (ref 8.9–10.3)
GFR calc non Af Amer: >60 mL/min (ref >60)
Glucose, Bld: 125 mg/dL — ABNORMAL HIGH (ref 65–99)
Potassium: 3.5 mmol/L (ref 3.5–5.1)
SODIUM: 137 mmol/L (ref 135–145)
Total Bilirubin: 0.7 mg/dL (ref 0.3–1.2)
Total Protein: 8.6 g/dL — ABNORMAL HIGH (ref 6.5–8.1)

## 2016-12-12 MED ORDER — DIPHENHYDRAMINE HCL 50 MG/ML IJ SOLN
25.0000 mg | Freq: Once | INTRAMUSCULAR | Status: AC
  Administered 2016-12-12: 25 mg via INTRAVENOUS
  Filled 2016-12-12: qty 1

## 2016-12-12 MED ORDER — METOCLOPRAMIDE HCL 5 MG/ML IJ SOLN
10.0000 mg | Freq: Once | INTRAMUSCULAR | Status: AC
  Administered 2016-12-12: 10 mg via INTRAVENOUS
  Filled 2016-12-12: qty 2

## 2016-12-12 MED ORDER — SODIUM CHLORIDE 0.9 % IV BOLUS (SEPSIS)
1000.0000 mL | Freq: Once | INTRAVENOUS | Status: AC
  Administered 2016-12-12: 1000 mL via INTRAVENOUS

## 2016-12-12 MED ORDER — KETOROLAC TROMETHAMINE 30 MG/ML IJ SOLN
30.0000 mg | Freq: Once | INTRAMUSCULAR | Status: AC
Start: 2016-12-12 — End: 2016-12-12
  Administered 2016-12-12: 30 mg via INTRAVENOUS
  Filled 2016-12-12: qty 1

## 2016-12-12 NOTE — ED Notes (Signed)
Patient transported to CT 

## 2016-12-12 NOTE — ED Notes (Signed)
Patient discharged during downtime. See paper charting. Patient's last stated pain score 2 out of 10.

## 2016-12-12 NOTE — ED Provider Notes (Signed)
[facility] Emergency Department Provider Note   ____________________________________________   First MD Initiated Contact with Patient 12/12/16 0009     (approximate)  I have reviewed the triage vital signs and the nursing notes.   HISTORY  Chief Complaint Migraine    HPI Alyssa Wood is a 56 y.o. female who comes into the hospital today with a headache. The patient reports that her headache started around 5 PM. It was mild. She took 2 ibuprofen around 6 but the medication did not help. She reports that the headache got worse and worse during the evening. She felt as though she couldn't breathe and felt weak. The patient felt nauseous but didn't vomit. She reports that the headache is both sides of her temple and goes into her neck. The patient has a cervical spinal cord stimulator for complex regional pain syndrome in her arms. The patient also has a pain management doctor. She reports that she doesn't get headaches frequently and hasn't had them since about 10 years ago. The patient states that she is sensitive to light in her vision is blurred. The patient rates her pain a 10 out of 10 in intensity. She is also complaining of pain in her arms. The patient came into the hospital today as she was unsure what was going on and couldn't tolerate the pain. She is here for evaluation. Denies any chest pain and reports that her shortness of breath is resolved.   Past Medical History:  Diagnosis Date  . Anxiety   . Chronic pain   . GERD (gastroesophageal reflux disease)   . Hypertension     Patient Active Problem List   Diagnosis Date Noted  . Personal history of colonic polyps   . Benign neoplasm of transverse colon   . Benign neoplasm of sigmoid colon     Past Surgical History:  Procedure Laterality Date  . CHOLECYSTECTOMY  1992  . SHOULDER SURGERY Right   . SPINAL CORD STIMULATOR IMPLANT Right Jan 2013  . WRIST SURGERY Right    right hand  and wrist reconstruction    Prior to Admission medications   Medication Sig Start Date End Date Taking? Authorizing Provider  calcium-vitamin D (OSCAL WITH D) 500-200 MG-UNIT tablet Take 1 tablet by mouth.    [provider]  Cyanocobalamin (VITAMIN B 12 PO) Take 1 tablet by mouth daily.    [provider]  lisinopril-hydrochlorothiazide (PRINZIDE,ZESTORETIC) 20-12.5 MG tablet Take 1 tablet by mouth daily.     [provider]  methylphenidate (RITALIN) 5 MG tablet Take 5 mg by mouth 2 (two) times daily. Reported on 08/21/2015    [provider]  Na Sulfate-K Sulfate-Mg Sulf (SUPREP BOWEL PREP KIT) 17.5-3.13-1.6 GM/180ML SOLN Take 1 Dose by mouth once. Please use bowel prep instructions provided 08/19/15   [name], MD  nortriptyline (PAMELOR) 10 MG capsule Take 10 mg by mouth at bedtime.    [provider]  omeprazole (PRILOSEC) 20 MG capsule Take 20 mg by mouth 2 (two) times daily.     [provider]  ondansetron (ZOFRAN) 4 MG tablet Take 4 mg by mouth every 8 (eight) hours as needed for nausea or vomiting.    [provider]    Allergies Trazodone and nefazodone and Hydrocodone  No family history on file.  Social History Social History   Tobacco Use  . Smoking status: Never Smoker  . Smokeless tobacco: Never Used  Substance Use Topics  . Alcohol use: No  .  Drug use: No    Review of Systems  Constitutional: No fever/chills Eyes: No visual changes. ENT: No sore throat. Cardiovascular: Denies chest pain. Respiratory:  shortness of breath. Gastrointestinal: No abdominal pain.  No nausea, no vomiting.  No diarrhea.  No constipation. Genitourinary: Negative for dysuria. Musculoskeletal: Negative for back pain. Skin: Negative for rash. Neurological: Generalized weakness and headache   ____________________________________________   PHYSICAL EXAM:  VITAL SIGNS: ED Triage Vitals  Enc Vitals Group     BP  12/11/16 2357 (!) 168/86     Pulse Rate 12/11/16 2357 87     Resp 12/11/16 2357 20     Temp --      Temp src --      SpO2 12/11/16 2357 97 %     Weight 12/11/16 2118 155 lb (70.3 kg)     Height 12/11/16 2118 5' 3"  (1.6 m)     Head Circumference --      Peak Flow --      Pain Score 12/11/16 2116 10     Pain Loc --      Pain Edu? --      Excl. in [place]? --     Constitutional: Alert and oriented. Well appearing and in mild distress. Eyes: Conjunctivae are normal. PERRL. EOMI. Head: Atraumatic. Nose: No congestion/rhinnorhea. Mouth/Throat: Mucous membranes are moist.  Oropharynx non-erythematous. Neck: Supple with no meningeal signs Cardiovascular: Normal rate, regular rhythm. Grossly normal heart sounds.  Good peripheral circulation. Respiratory: Normal respiratory effort.  No retractions. Lungs CTAB. Gastrointestinal: Soft and nontender. No distention. Positive bowel sounds Musculoskeletal: No lower extremity tenderness nor edema.   Neurologic:  Normal speech and language. Cranial nerves II through XII are grossly intact with no focal motor or neuro deficits. Patient is generally weak and has poor effort when trying to do strength exam. Skin:  Skin is warm, dry and intact.  Psychiatric: Mood and affect are normal.   ____________________________________________   LABS (all labs ordered are listed, but only abnormal results are displayed)  Labs Reviewed  COMPREHENSIVE METABOLIC PANEL - Abnormal; Notable for the following components:      Result Value   Glucose, Bld 125 (*)    Total Protein 8.6 (*)    All other components within normal limits  CBC   ____________________________________________  EKG  none ____________________________________________  RADIOLOGY  Ct Head Wo Contrast  Result Date: 12/12/2016 CLINICAL DATA:  56 year old female with headache. EXAM: CT HEAD WITHOUT CONTRAST TECHNIQUE: Contiguous axial images were obtained from the base of the skull through the  vertex without intravenous contrast. COMPARISON:  Head CT dated 01/27/2010 FINDINGS: Brain: There is mild cortical volume loss. The gray-white matter discrimination is preserved. There is no acute intracranial hemorrhage. No mass effect or midline shift noted. No extra-axial fluid collection. Vascular: No hyperdense vessel or unexpected calcification. Skull: Normal. Negative for fracture or focal lesion. Sinuses/Orbits: No acute finding. Other: None IMPRESSION: 1. No acute intracranial hemorrhage. 2. Mild diffuse cortical atrophy. If symptoms persist, and there are no contraindications, MRI may provide better evaluation if clinically indicated. Electronically Signed   By: Anner Crete M.D.   On: 12/12/2016 00:57    ____________________________________________   PROCEDURES  Procedure(s) performed: None  Procedures  Critical Care performed: No  ____________________________________________   INITIAL IMPRESSION / ASSESSMENT AND PLAN / ED COURSE  As part of my medical decision making, I reviewed the following data within the electronic MEDICAL RECORD NUMBER Notes from prior ED visits and [place] Controlled  Substance Database   This is a 56 year old female who comes into the hospital today with headache. 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 I did give the patient a dose of Reglan, Benadryl, Toradol and a liter of normal saline. I sent the patient for CT scan of her head which was unremarkable. After receiving the medication I evaluated the patient and her pain was much improved. That she does not feel weak anymore and her headache is almost gone. I explained the results of her blood work to the patient as well as the imaging studies. I am unsure if this is related to her pain syndrome or previous headaches but at this time her workup is unremarkable. The patient's blood pressures also unremarkable. I will discharge the patient  home to have her follow-up with her primary care physician. The patient has no further complaints or concerns. I instructed the patient to return if she has any worsening condition or any other concerns. She'll be discharged home.      ____________________________________________   FINAL CLINICAL IMPRESSION(S) / ED DIAGNOSES  Final diagnoses:  Acute nonintractable headache, unspecified headache type      NEW MEDICATIONS STARTED DURING THIS VISIT:  This SmartLink is deprecated. Use AVSMEDLIST instead to display the medication list for a patient.   Note:  This document was prepared using Dragon voice recognition software and may include unintentional dictation errors.    Loney Hering, MD 12/12/16 [phone]

## 2017-01-11 ENCOUNTER — Encounter: Payer: Self-pay | Admitting: Nurse Practitioner

## 2017-01-11 ENCOUNTER — Ambulatory Visit
Admission: RE | Admit: 2017-01-11 | Discharge: 2017-01-11 | Disposition: A | Discharge status: Home / Self Care | Payer: Medicare Other | Source: Ambulatory Visit | Attending: Nurse Practitioner | Admitting: Nurse Practitioner

## 2017-01-11 ENCOUNTER — Other Ambulatory Visit: Admission: RE | Admit: 2017-01-11 | Payer: Self-pay | Source: Ambulatory Visit | Admitting: *Deleted

## 2017-01-11 ENCOUNTER — Ambulatory Visit: Payer: Medicare Other | Attending: Nurse Practitioner | Admitting: Nurse Practitioner

## 2017-01-11 DIAGNOSIS — Z79899 Other long term (current) drug therapy: Secondary | ICD-10-CM | POA: Diagnosis not present

## 2017-01-11 DIAGNOSIS — M545 Low back pain, unspecified: Secondary | ICD-10-CM | POA: Insufficient documentation

## 2017-01-11 DIAGNOSIS — G8929 Other chronic pain: Secondary | ICD-10-CM | POA: Insufficient documentation

## 2017-01-11 DIAGNOSIS — M542 Cervicalgia: Secondary | ICD-10-CM | POA: Insufficient documentation

## 2017-01-11 DIAGNOSIS — I10 Essential (primary) hypertension: Secondary | ICD-10-CM | POA: Insufficient documentation

## 2017-01-11 DIAGNOSIS — M5136 Other intervertebral disc degeneration, lumbar region: Secondary | ICD-10-CM | POA: Diagnosis not present

## 2017-01-11 DIAGNOSIS — M2550 Pain in unspecified joint: Secondary | ICD-10-CM | POA: Diagnosis not present

## 2017-01-11 DIAGNOSIS — M899 Disorder of bone, unspecified: Secondary | ICD-10-CM | POA: Diagnosis not present

## 2017-01-11 DIAGNOSIS — Z789 Other specified health status: Secondary | ICD-10-CM | POA: Diagnosis not present

## 2017-01-11 DIAGNOSIS — M79601 Pain in right arm: Secondary | ICD-10-CM | POA: Insufficient documentation

## 2017-01-11 DIAGNOSIS — F419 Anxiety disorder, unspecified: Secondary | ICD-10-CM | POA: Insufficient documentation

## 2017-01-11 DIAGNOSIS — K219 Gastro-esophageal reflux disease without esophagitis: Secondary | ICD-10-CM | POA: Insufficient documentation

## 2017-01-11 DIAGNOSIS — M79602 Pain in left arm: Secondary | ICD-10-CM | POA: Diagnosis not present

## 2017-01-11 DIAGNOSIS — Z9049 Acquired absence of other specified parts of digestive tract: Secondary | ICD-10-CM | POA: Insufficient documentation

## 2017-01-11 DIAGNOSIS — Z86010 Personal history of colonic polyps: Secondary | ICD-10-CM | POA: Insufficient documentation

## 2017-01-11 NOTE — Progress Notes (Signed)
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 rules and the need to arrive early. 4. Other appointments and responsibilities:  Avoid scheduling any other appointments before or after your pain clinic appointments.  5. Be prepared:  Write down everything that you need to discuss with your healthcare provider and give this information to the admitting nurse. Write down the medications that you will need refilled. Bring your pills and bottles (even the empty ones), to all of your appointments, except for those where a procedure is scheduled. 6. No children or pets:  Find someone to take care of them. It is not appropriate to bring them in. 7. Scheduling changes:  We request "advanced notification" of any changes or cancellations. 8. Advanced notification:  Defined as a time period of more than 24 hours prior to the originally scheduled appointment. This allows for the appointment to be  offered to other patients. 9. Rescheduling:  When a visit is rescheduled, it will require the cancellation of the original appointment. For this reason they both fall within the category of "Cancellations".  10. Cancellations:  They require advanced notification. Any cancellation less than 24 hours before the  appointment will be recorded as a "No Show". 11. No Show:  Defined as an unkept appointment where the patient failed to notify or declare to the practice their intention or inability to keep the appointment.  Corrective process for repeat offenders:  1. Tardiness: Three (3) episodes of rescheduling due to late arrivals will be recorded as one (1) "No Show". 2. Cancellation or reschedule: Three (3) cancellations or rescheduling will be recorded as one (1) "No Show". 3. "No Shows": Three (3) "No Shows" within a 12 month period will result in discharge from the practice.  ____________________________________________________________________________________________   ____________________________________________________________________________________________  Pain Scale  Introduction: The pain score used by this practice is the Verbal Numerical Rating Scale (VNRS-11). This is an 11-point scale. It is for adults and children 10 years or older. There are significant differences in how the pain score is reported, used, and applied. Forget everything you learned in the past and learn this scoring system.  General Information: The scale should reflect your current level of pain. Unless you are specifically asked for the level of your worst pain, or your average pain. If you are asked for one of these two, then it should be understood that it is over the past 24 hours.  Basic Activities of Daily Living (ADL): Personal hygiene, dressing, eating, transferring, and using restroom.  Instructions: Most patients tend to report their level of pain as a combination of two factors, their physical pain  and their psychosocial pain. This last one is also known as "suffering" and it is reflection of how physical pain affects you socially and psychologically. From now on, report them separately. From this point on, when asked to report your pain level, report only your physical pain. Use the following table for reference.  Pain Clinic Pain Levels (0-5/10)  Pain Level Score  Description  No Pain 0   Mild pain 1 Nagging, annoying, but does not interfere with basic activities of daily living (ADL). Patients are able to eat, bathe, get dressed, toileting (being able to get on and off the toilet and perform personal hygiene functions), transfer (move in and out of bed or a chair without assistance), and maintain continence (able to control bladder and bowel functions). Blood pressure and heart rate are unaffected. A normal heart rate for a healthy adult ranges from 60 to 100 bpm (beats per minute).   Mild to moderate pain 2 Noticeable and distracting. Impossible to hide from other people. More frequent flare-ups. Still possible to adapt and function close to normal. It can be very annoying and may have occasional stronger flare-ups. With discipline, patients may get used to it and adapt.   Moderate pain 3 Interferes significantly with activities of daily living (ADL). It becomes difficult to feed, bathe, get dressed, get on and off the toilet or to perform personal hygiene functions. Difficult to get in and out of bed or a chair without assistance. Very distracting. With effort, it can be ignored when deeply involved in activities.   Moderately severe pain 4 Impossible to ignore for more than a few minutes. With effort, patients may still be able to manage work or participate in some social activities. Very difficult to concentrate. Signs of autonomic nervous system discharge are evident: dilated pupils (mydriasis); mild sweating (diaphoresis); sleep interference. Heart rate becomes elevated (>115 bpm).  Diastolic blood pressure (lower number) rises above 100 mmHg. Patients find relief in laying down and not moving.   Severe pain 5 Intense and extremely unpleasant. Associated with frowning face and frequent crying. Pain overwhelms the senses.  Ability to  do any activity or maintain social relationships becomes significantly limited. Conversation becomes difficult. Pacing back and forth is common, as getting into a comfortable position is nearly impossible. Pain wakes you up from deep sleep. Physical signs will be obvious: pupillary dilation; increased sweating; goosebumps; brisk reflexes; cold, clammy hands and feet; nausea, vomiting or dry heaves; loss of appetite; significant sleep disturbance with inability to fall asleep or to remain asleep. When persistent, significant weight loss is observed due to the complete loss of appetite and sleep deprivation.  Blood pressure and heart rate becomes significantly elevated. Caution: If elevated blood pressure triggers a pounding headache associated with blurred vision, then the patient should immediately seek attention at an urgent or emergency care unit, as these may be signs of an impending stroke.    Emergency Department Pain Levels (6-10/10)  Emergency Room Pain 6 Severely limiting. Requires emergency care and should not be seen or managed at an outpatient pain management facility. Communication becomes difficult and requires great effort. Assistance to reach the emergency department may be required. Facial flushing and profuse sweating along with potentially dangerous increases in heart rate and blood pressure will be evident.   Distressing pain 7 Self-care is very difficult. Assistance is required to transport, or use restroom. Assistance to reach the emergency department will be required. Tasks requiring coordination, such as bathing and getting dressed become very difficult.   Disabling pain 8 Self-care is no longer possible. At this level, pain is  disabling. The individual is unable to do even the most "basic" activities such as walking, eating, bathing, dressing, transferring to a bed, or toileting. Fine motor skills are lost. It is difficult to think clearly.   Incapacitating pain 9 Pain becomes incapacitating. Thought processing is no longer possible. Difficult to remember your own name. Control of movement and coordination are lost.   The worst pain imaginable 10 At this level, most patients pass out from pain. When this level is reached, collapse of the autonomic nervous system occurs, leading to a sudden drop in blood pressure and heart rate. This in turn results in a temporary and dramatic drop in blood flow to the brain, leading to a loss of consciousness. Fainting is one of the body's self defense mechanisms. Passing out puts the brain in a calmed state and causes it to shut down for a while, in order to begin the healing process.    Summary: 1. Refer to this scale when providing [place] with your pain level. 2. Be accurate and careful when reporting your pain level. This will help with your care. 3. Over-reporting your pain level will lead to loss of credibility. 4. Even a level of 1/10 means that there is pain and will be treated at our facility. 5. High, inaccurate reporting will be documented as "Symptom Exaggeration", leading to loss of credibility and suspicions of possible secondary gains such as obtaining more narcotics, or wanting to appear disabled, for fraudulent reasons. 6. Only pain levels of 5 or below will be seen at our facility. 7. Pain levels of 6 and above will be sent to the Emergency Department and the appointment cancelled. ____________________________________________________________________________________________

## 2017-01-11 NOTE — Patient Instructions (Addendum)

## 2017-01-11 NOTE — Progress Notes (Signed)
Safety precautions to be maintained throughout the outpatient stay will include: orient to surroundings, keep bed in low position, maintain call bell within reach at all times, provide assistance with transfer out of bed and ambulation.  

## 2017-01-12 LAB — COMP. METABOLIC PANEL (12)
AST: 43 IU/L — AB (ref 0–40)
Albumin/Globulin Ratio: 1.2 (ref 1.2–2.2)
Albumin: 4.5 g/dL (ref 3.5–5.5)
Alkaline Phosphatase: 121 IU/L — ABNORMAL HIGH (ref 39–117)
BUN / CREAT RATIO: 19 (ref 9–23)
BUN: 12 mg/dL (ref 6–24)
Bilirubin Total: 0.3 mg/dL (ref 0.0–1.2)
CALCIUM: 10 mg/dL (ref 8.7–10.2)
Chloride: 100 mmol/L (ref 96–106)
Creatinine, Ser: 0.62 mg/dL (ref 0.57–1.00)
GFR, EST AFRICAN AMERICAN: 115 mL/min/{1.73_m2} (ref >59)
GFR, EST NON AFRICAN AMERICAN: 100 mL/min/{1.73_m2} (ref >59)
GLOBULIN, TOTAL: 3.7 g/dL (ref 1.5–4.5)
GLUCOSE: 97 mg/dL (ref 65–99)
Potassium: 4.5 mmol/L (ref 3.5–5.2)
Sodium: 140 mmol/L (ref 134–144)
Total Protein: 8.2 g/dL (ref 6.0–8.5)

## 2017-01-12 LAB — C-REACTIVE PROTEIN: CRP: 2.9 mg/L (ref 0.0–4.9)

## 2017-01-12 LAB — MAGNESIUM: Magnesium: 2.3 mg/dL (ref 1.6–2.3)

## 2017-01-12 LAB — VITAMIN B12: Vitamin B-12: 529 pg/mL (ref 232–1245)

## 2017-01-12 LAB — SEDIMENTATION RATE: Sed Rate: 47 mm/hr — ABNORMAL HIGH (ref 0–40)

## 2017-01-12 NOTE — Progress Notes (Signed)
Results were reviewed and found to be: mildly abnormal  No acute injury or pathology identified  Review would suggest interventional pain management techniques may be of benefit 

## 2017-01-15 LAB — 25-HYDROXY VITAMIN D LCMS D2+D3: 25-Hydroxy, Vitamin D-2: 2.5 ng/mL

## 2017-01-15 LAB — 25-HYDROXYVITAMIN D LCMS D2+D3
25-HYDROXY, VITAMIN D-3: 19 ng/mL
25-HYDROXY, VITAMIN D: 22 ng/mL — AB

## 2017-01-15 LAB — COMPLIANCE DRUG ANALYSIS, UR

## 2017-01-17 ENCOUNTER — Ambulatory Visit: Payer: Medicare Other | Admitting: Pain Medicine

## 2017-01-19 ENCOUNTER — Ambulatory Visit: Payer: Medicare Other | Attending: Pain Medicine | Admitting: Pain Medicine

## 2017-01-19 ENCOUNTER — Encounter: Payer: Self-pay | Admitting: Pain Medicine

## 2017-01-19 VITALS — BP 129/70 | HR 81 | Temp 98.0°F | Resp 16 | Ht 66.0 in | Wt 155.0 lb

## 2017-01-19 DIAGNOSIS — R51 Headache: Secondary | ICD-10-CM | POA: Insufficient documentation

## 2017-01-19 DIAGNOSIS — M1990 Unspecified osteoarthritis, unspecified site: Secondary | ICD-10-CM | POA: Insufficient documentation

## 2017-01-19 DIAGNOSIS — M545 Low back pain, unspecified: Secondary | ICD-10-CM

## 2017-01-19 DIAGNOSIS — F418 Other specified anxiety disorders: Secondary | ICD-10-CM | POA: Diagnosis not present

## 2017-01-19 DIAGNOSIS — E559 Vitamin D deficiency, unspecified: Secondary | ICD-10-CM | POA: Diagnosis not present

## 2017-01-19 DIAGNOSIS — D125 Benign neoplasm of sigmoid colon: Secondary | ICD-10-CM | POA: Insufficient documentation

## 2017-01-19 DIAGNOSIS — R32 Unspecified urinary incontinence: Secondary | ICD-10-CM | POA: Diagnosis not present

## 2017-01-19 DIAGNOSIS — M25512 Pain in left shoulder: Secondary | ICD-10-CM | POA: Diagnosis not present

## 2017-01-19 DIAGNOSIS — I10 Essential (primary) hypertension: Secondary | ICD-10-CM | POA: Insufficient documentation

## 2017-01-19 DIAGNOSIS — G894 Chronic pain syndrome: Secondary | ICD-10-CM

## 2017-01-19 DIAGNOSIS — Z79899 Other long term (current) drug therapy: Secondary | ICD-10-CM | POA: Diagnosis not present

## 2017-01-19 DIAGNOSIS — M25511 Pain in right shoulder: Secondary | ICD-10-CM | POA: Diagnosis not present

## 2017-01-19 DIAGNOSIS — G4486 Cervicogenic headache: Secondary | ICD-10-CM

## 2017-01-19 DIAGNOSIS — M79601 Pain in right arm: Secondary | ICD-10-CM | POA: Diagnosis not present

## 2017-01-19 DIAGNOSIS — M7918 Myalgia, other site: Secondary | ICD-10-CM | POA: Diagnosis not present

## 2017-01-19 DIAGNOSIS — M79602 Pain in left arm: Secondary | ICD-10-CM | POA: Diagnosis not present

## 2017-01-19 DIAGNOSIS — T7411XA Adult physical abuse, confirmed, initial encounter: Secondary | ICD-10-CM

## 2017-01-19 DIAGNOSIS — Z86010 Personal history of colonic polyps: Secondary | ICD-10-CM | POA: Insufficient documentation

## 2017-01-19 DIAGNOSIS — M542 Cervicalgia: Secondary | ICD-10-CM

## 2017-01-19 DIAGNOSIS — Z9689 Presence of other specified functional implants: Secondary | ICD-10-CM

## 2017-01-19 DIAGNOSIS — K219 Gastro-esophageal reflux disease without esophagitis: Secondary | ICD-10-CM | POA: Insufficient documentation

## 2017-01-19 DIAGNOSIS — M159 Polyosteoarthritis, unspecified: Secondary | ICD-10-CM | POA: Insufficient documentation

## 2017-01-19 DIAGNOSIS — G8929 Other chronic pain: Secondary | ICD-10-CM

## 2017-01-19 DIAGNOSIS — M150 Primary generalized (osteo)arthritis: Secondary | ICD-10-CM

## 2017-01-19 MED ORDER — MELOXICAM 15 MG PO TABS: 15.0000 mg | ORAL_TABLET | Freq: Every day | ORAL | 5 refills | Status: DC

## 2017-01-19 MED ORDER — TRAMADOL HCL 50 MG PO TABS: 50.0000 mg | ORAL_TABLET | Freq: Four times a day (QID) | ORAL | 0 refills | Status: DC | PRN

## 2017-01-19 MED ORDER — CHOLECALCIFEROL 125 MCG (5000 UT) PO CAPS: 5000.0000 [IU] | ORAL_CAPSULE | Freq: Every day | ORAL | 0 refills | Status: DC

## 2017-01-19 MED ORDER — CALCIUM PLUS D3 ABSORBABLE 600-2500 MG-UNIT PO CAPS: 1.0000 | ORAL_CAPSULE | Freq: Every day | ORAL | 0 refills | Status: DC

## 2017-01-19 MED ORDER — PREDNISONE 20 MG PO TABS: ORAL_TABLET | ORAL | 0 refills | Status: AC

## 2017-01-19 MED ORDER — ERGOCALCIFEROL 1.25 MG (50000 UT) PO CAPS: 50000.0000 [IU] | ORAL_CAPSULE | ORAL | 0 refills | Status: AC

## 2017-01-19 MED ORDER — CYCLOBENZAPRINE HCL 10 MG PO TABS: 10.0000 mg | ORAL_TABLET | Freq: Three times a day (TID) | ORAL | 5 refills | Status: DC | PRN

## 2017-01-19 NOTE — Patient Instructions (Signed)
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
## 2017-01-27 ENCOUNTER — Ambulatory Visit (HOSPITAL_BASED_OUTPATIENT_CLINIC_OR_DEPARTMENT_OTHER): Payer: Medicare Other | Admitting: Pain Medicine

## 2017-01-27 ENCOUNTER — Ambulatory Visit
Admission: RE | Admit: 2017-01-27 | Discharge: 2017-01-27 | Disposition: A | Discharge status: Home / Self Care | Payer: Medicare Other | Source: Ambulatory Visit | Attending: Pain Medicine | Admitting: Pain Medicine

## 2017-01-27 ENCOUNTER — Other Ambulatory Visit: Payer: Self-pay

## 2017-01-27 ENCOUNTER — Encounter: Payer: Self-pay | Admitting: Pain Medicine

## 2017-01-27 ENCOUNTER — Ambulatory Visit: Admission: RE | Admit: 2017-01-27 | Payer: Medicare Other | Source: Ambulatory Visit

## 2017-01-27 VITALS — BP 138/63 | HR 85 | Temp 97.8°F | Resp 16 | Ht 65.0 in | Wt 152.0 lb

## 2017-01-27 DIAGNOSIS — M5412 Radiculopathy, cervical region: Secondary | ICD-10-CM | POA: Insufficient documentation

## 2017-01-27 DIAGNOSIS — G8929 Other chronic pain: Secondary | ICD-10-CM | POA: Diagnosis not present

## 2017-01-27 DIAGNOSIS — Z9689 Presence of other specified functional implants: Secondary | ICD-10-CM | POA: Diagnosis not present

## 2017-01-27 DIAGNOSIS — M79622 Pain in left upper arm: Secondary | ICD-10-CM | POA: Diagnosis not present

## 2017-01-27 DIAGNOSIS — Z9049 Acquired absence of other specified parts of digestive tract: Secondary | ICD-10-CM | POA: Insufficient documentation

## 2017-01-27 DIAGNOSIS — M25511 Pain in right shoulder: Secondary | ICD-10-CM | POA: Diagnosis not present

## 2017-01-27 DIAGNOSIS — M25512 Pain in left shoulder: Secondary | ICD-10-CM | POA: Insufficient documentation

## 2017-01-27 DIAGNOSIS — M79621 Pain in right upper arm: Secondary | ICD-10-CM | POA: Diagnosis not present

## 2017-01-27 DIAGNOSIS — M4722 Other spondylosis with radiculopathy, cervical region: Secondary | ICD-10-CM | POA: Insufficient documentation

## 2017-01-27 DIAGNOSIS — M542 Cervicalgia: Secondary | ICD-10-CM | POA: Diagnosis present

## 2017-01-27 DIAGNOSIS — M79601 Pain in right arm: Secondary | ICD-10-CM

## 2017-01-27 DIAGNOSIS — M79602 Pain in left arm: Secondary | ICD-10-CM

## 2017-01-27 MED ORDER — ROPIVACAINE HCL 2 MG/ML IJ SOLN
4.0000 mL | Freq: Once | INTRAMUSCULAR | Status: DC
  Administered 2017-01-27: 1 mL via PERINEURAL

## 2017-01-27 MED ORDER — DEXAMETHASONE SODIUM PHOSPHATE 10 MG/ML IJ SOLN
INTRAMUSCULAR | Status: AC
  Filled 2017-01-27: qty 1

## 2017-01-27 MED ORDER — FENTANYL CITRATE (PF) 100 MCG/2ML IJ SOLN
25.0000 ug | INTRAMUSCULAR | Status: DC | PRN
Start: 2017-01-27 — End: 2017-01-27
  Administered 2017-01-27: 50 ug via INTRAVENOUS
  Filled 2017-01-27: qty 2

## 2017-01-27 MED ORDER — LIDOCAINE HCL 2 % IJ SOLN
10.0000 mL | Freq: Once | INTRAMUSCULAR | Status: AC
  Administered 2017-01-27: 400 mg

## 2017-01-27 MED ORDER — LIDOCAINE HCL (PF) 1 % IJ SOLN
INTRAMUSCULAR | Status: AC
Start: 2017-01-27 — End: 2017-01-27
  Filled 2017-01-27: qty 5

## 2017-01-27 MED ORDER — ROPIVACAINE HCL 2 MG/ML IJ SOLN
1.0000 mL | Freq: Once | INTRAMUSCULAR | Status: AC
  Administered 2017-01-27: 10 mL via EPIDURAL

## 2017-01-27 MED ORDER — SODIUM CHLORIDE 0.9% FLUSH
1.0000 mL | Freq: Once | INTRAVENOUS | Status: AC
  Administered 2017-01-27: 1 mL

## 2017-01-27 MED ORDER — IOPAMIDOL (ISOVUE-M 200) INJECTION 41%
10.0000 mL | Freq: Once | INTRAMUSCULAR | Status: AC
  Administered 2017-01-27: 20 mL via EPIDURAL
  Filled 2017-01-27: qty 10

## 2017-01-27 MED ORDER — DEXAMETHASONE SODIUM PHOSPHATE 10 MG/ML IJ SOLN
10.0000 mg | Freq: Once | INTRAMUSCULAR | Status: AC
  Administered 2017-01-27: 10 mg

## 2017-01-27 MED ORDER — METHYLPREDNISOLONE ACETATE 80 MG/ML IJ SUSP: 80.0000 mg | Freq: Once | INTRAMUSCULAR | Status: DC

## 2017-01-27 MED ORDER — LIDOCAINE HCL 2 % IJ SOLN
10.0000 mL | Freq: Once | INTRAMUSCULAR | Status: DC
  Filled 2017-01-27: qty 100

## 2017-01-27 MED ORDER — MIDAZOLAM HCL 5 MG/5ML IJ SOLN
1.0000 mg | INTRAMUSCULAR | Status: DC | PRN
  Administered 2017-01-27: 2 mg via INTRAVENOUS
  Filled 2017-01-27: qty 5

## 2017-01-27 MED ORDER — LACTATED RINGERS IV SOLN
1000.0000 mL | Freq: Once | INTRAVENOUS | Status: AC
  Administered 2017-01-27: 1000 mL via INTRAVENOUS

## 2017-01-27 MED ORDER — SODIUM CHLORIDE 0.9 % IJ SOLN
INTRAMUSCULAR | Status: AC
  Filled 2017-01-27: qty 10

## 2017-01-27 NOTE — Patient Instructions (Signed)

## 2017-01-27 NOTE — Progress Notes (Signed)
Patient's Name: Alyssa Wood  MRN: [id number]  Referring Provider: Center, [facility]  DOB: Jul 07, 1960  PCP: [facility], [place]  DOS: 01/27/2017  Note by: Gaspar Cola, MD  Service setting: Ambulatory outpatient  Specialty: Interventional Pain Management  Patient type: Established  Location: [facility] (AMB) Pain Management Facility  Visit type: Interventional Procedure   Primary Reason for Visit: Interventional Pain Management Treatment. CC: Neck Pain; Shoulder Pain (both sides, mainly on right side); and Back Pain (mid)  Procedure:  Anesthesia, Analgesia, Anxiolysis:  Type: Diagnostic, Inter-Laminar, Epidural Steroid Injection Region: Posterior Cervico-thoracic Region Level: C7-T1 Laterality: Right-Sided Paramedial  Type: Local Anesthesia with Moderate (Conscious) Sedation Local Anesthetic: Lidocaine 1% Route: Intravenous (IV) IV Access: Secured Sedation: Meaningful verbal contact was maintained at all times during the procedure  Indication(s): Analgesia and Anxiety   Indications: 1. Chronic cervical radiculitis (Bilateral) (R>L)   2. Cervical spondylosis with radiculopathy (Bilateral)   3. Chronic shoulder radicular pain (Bilateral)   4. Chronic upper extremity pain (Bilateral) (R>L)   5. Radicular pain of shoulder    Pain Score: Pre-procedure: 9 /10 Post-procedure: 0-No pain/10  Pre-op Assessment:  Alyssa Wood is a 56 y.o. (year old), female patient, seen today for interventional treatment. She  has a past surgical history that includes Cholecystectomy (1992); Spinal cord stimulator implant (Right, Jan 2013); Shoulder surgery (Right); Wrist surgery (Right); Colonoscopy with propofol (N/A, 08/28/2015); and polypectomy (08/28/2015). Alyssa Wood has a current medication list which includes the following prescription(s): calcium plus d3 absorbable, calcium-vitamin d, cholecalciferol, cyanocobalamin, cyclobenzaprine, ergocalciferol,  lisinopril-hydrochlorothiazide, meloxicam, omeprazole, prednisone, and tramadol, and the following Facility-Administered Medications: fentanyl, lidocaine, and midazolam. Her primarily concern today is the Neck Pain; Shoulder Pain (both sides, mainly on right side); and Back Pain (mid)  The patient came in today initially scheduled to have a left suprascapular nerve block. Even though we both speak Spanish, I think that she has a communication problem. She tends to agree with people to quickly without really thinking or listening to what they're saying. On her last visit, I went over her pain and it was very clear to me that she was complaining of pain in the left shoulder. However, now she indicates that she has been having pain in both shoulders, but the right one is still worse. She also points at the area between the shoulder blades and because the symptoms that she has going down the arms I believe that we may be dealing with a radiculopathy instead of a problem with her shoulder. Because of this, I have agreed to change her schedule procedure from a left suprascapular nerve block to a cervical epidural steroid injection. She has had these in the past and she indicated that they do seem to help. She also indicates that the medications that I put her on has helped significantly with her pain and she feels are making some progress. However, she is quick to point out that were still not at a point where she is comfortable.  Initial Vital Signs: Blood pressure 136/70, pulse 85, temperature 99.4 F (37.4 C), temperature source Oral, resp. rate 16, height 5\' 5"  (1.651 m), weight 152 lb (68.9 kg), SpO2 99 %. BMI: Estimated body mass index is 25.29 kg/m as calculated from the following:   Height as of this encounter: 5\' 5"  (1.651 m).   Weight as of this encounter: 152 lb (68.9 kg).  Risk Assessment: Allergies: Reviewed. She is allergic to trazodone and nefazodone and hydrocodone.  Allergy Precautions: None  required Coagulopathies: Reviewed. None identified.  Blood-thinner therapy: None at this time Active Infection(s): Reviewed. None identified. Ms. Silvey is afebrile  Site Confirmation: Alyssa Wood was asked to confirm the procedure and laterality before marking the site Procedure checklist: Completed Consent: Before the procedure and under the influence of no sedative(s), amnesic(s), or anxiolytics, the patient was informed of the treatment options, risks and possible complications. To fulfill our ethical and legal obligations, as recommended by the American Medical Association's Code of Ethics, I have informed the patient of my clinical impression; the nature and purpose of the treatment or procedure; the risks, benefits, and possible complications of the intervention; the alternatives, including doing nothing; the risk(s) and benefit(s) of the alternative treatment(s) or procedure(s); and the risk(s) and benefit(s) of doing nothing. The patient was provided information about the general risks and possible complications associated with the procedure. These may include, but are not limited to: failure to achieve desired goals, infection, bleeding, organ or nerve damage, allergic reactions, paralysis, and death. In addition, the patient was informed of those risks and complications associated to Spine-related procedures, such as failure to decrease pain; infection (i.e.: Meningitis, epidural or intraspinal abscess); bleeding (i.e.: epidural hematoma, subarachnoid hemorrhage, or any other type of intraspinal or peri-dural bleeding); organ or nerve damage (i.e.: Any type of peripheral nerve, nerve root, or spinal cord injury) with subsequent damage to sensory, motor, and/or autonomic systems, resulting in permanent pain, numbness, and/or weakness of one or several areas of the body; allergic reactions; (i.e.: anaphylactic reaction); and/or death. Furthermore, the patient was informed of  those risks and complications associated with the medications. These include, but are not limited to: allergic reactions (i.e.: anaphylactic or anaphylactoid reaction(s)); adrenal axis suppression; blood sugar elevation that in diabetics may result in ketoacidosis or comma; water retention that in patients with history of congestive heart failure may result in shortness of breath, pulmonary edema, and decompensation with resultant heart failure; weight gain; swelling or edema; medication-induced neural toxicity; particulate matter embolism and blood vessel occlusion with resultant organ, and/or nervous system infarction; and/or aseptic necrosis of one or more joints. Finally, the patient was informed that Medicine is not an exact science; therefore, there is also the possibility of unforeseen or unpredictable risks and/or possible complications that may result in a catastrophic outcome. The patient indicated having understood very clearly. We have given the patient no guarantees and we have made no promises. Enough time was given to the patient to ask questions, all of which were answered to the patient's satisfaction. Ms. Ephraim has indicated that she wanted to continue with the procedure. Attestation: I, the ordering provider, attest that I have discussed with the patient the benefits, risks, side-effects, alternatives, likelihood of achieving goals, and potential problems during recovery for the procedure that I have provided informed consent. Date: 01/27/2017; Time: 2:39 PM  Pre-Procedure Preparation:  Monitoring: As per clinic protocol. Respiration, ETCO2, SpO2, BP, heart rate and rhythm monitor placed and checked for adequate function Safety Precautions: Patient was assessed for positional comfort and pressure points before starting the procedure. Time-out: I initiated and conducted the "Time-out" before starting the procedure, as per protocol. The patient was asked to participate by  confirming the accuracy of the "Time Out" information. Verification of the correct person, site, and procedure were performed and confirmed by me, the nursing staff, and the patient. "Time-out" conducted as per Joint Commission's Universal Protocol (UP.01.01.01). "Time-out" Date & Time: 01/27/2017; 1511 hrs.  Description of Procedure Process:  Position: Prone with head of the table was raised to facilitate breathing. Target Area: For Epidural Steroid injections the target is the interlaminar space, initially targeting the lower border of the superior vertebral body lamina. Approach: Paramedial approach. Area Prepped: Entire PosteriorCervical Region Prepping solution: ChloraPrep (2% chlorhexidine gluconate and 70% isopropyl alcohol) Safety Precautions: Aspiration looking for blood return was conducted prior to all injections. At no point did we inject any substances, as a needle was being advanced. No attempts were made at seeking any paresthesias. Safe injection practices and needle disposal techniques used. Medications properly checked for expiration dates. SDV (single dose vial) medications used. Description of the Procedure: Protocol guidelines were followed. The procedure needle was introduced through the skin, ipsilateral to the reported pain, and advanced to the target area. Bone was contacted and the needle walked caudad, until the lamina was cleared. The epidural space was identified using "loss-of-resistance technique" with 2-3 ml of PF-NaCl (0.9% NSS), in a 5cc LOR glass syringe. Vitals:   01/27/17 1521 01/27/17 1528 01/27/17 1537 01/27/17 1548  BP: 121/70 (!) 115/54 (!) 100/53 138/63  Pulse:      Resp: 19 11 14 16   Temp:  97.8 F (36.6 C)    TempSrc:  Temporal    SpO2: 95% 90% 92% 94%  Weight:      Height:        Start Time: 1511 hrs. End Time: 1521 hrs. Materials:  Needle(s) Type: Epidural needle Gauge: 17G Length: 3.5-in Medication(s): We administered lactated ringers,  midazolam, fentaNYL, ropivacaine (PF) 2 mg/mL (0.2%), iopamidol, dexamethasone, ropivacaine (PF) 2 mg/mL (0.2%), sodium chloride flush, and lidocaine. Please see chart orders for dosing details.  Imaging Guidance (Spinal):  Type of Imaging Technique: Fluoroscopy Guidance (Spinal) Indication(s): Assistance in needle guidance and placement for procedures requiring needle placement in or near specific anatomical locations not easily accessible without such assistance. Exposure Time: Please see nurses notes. Contrast: Before injecting any contrast, we confirmed that the patient did not have an allergy to iodine, shellfish, or radiological contrast. Once satisfactory needle placement was completed at the desired level, radiological contrast was injected. Contrast injected under live fluoroscopy. No contrast complications. See chart for type and volume of contrast used. Fluoroscopic Guidance: I was personally present during the use of fluoroscopy. "Tunnel Vision Technique" used to obtain the best possible view of the target area. Parallax error corrected before commencing the procedure. "Direction-depth-direction" technique used to introduce the needle under continuous pulsed fluoroscopy. Once target was reached, antero-posterior, oblique, and lateral fluoroscopic projection used confirm needle placement in all planes. Images permanently stored in EMR. Interpretation: I personally interpreted the imaging intraoperatively. Adequate needle placement confirmed in multiple planes. Appropriate spread of contrast into desired area was observed. No evidence of afferent or efferent intravascular uptake. No intrathecal or subarachnoid spread observed. Permanent images saved into the patient's record.  Antibiotic Prophylaxis:  Indication(s): None identified Antibiotic given: None  Post-operative Assessment:  EBL: None Complications: No immediate post-treatment complications observed by team, or reported by  patient. Note: The patient tolerated the entire procedure well. A repeat set of vitals were taken after the procedure and the patient was kept under observation following institutional policy, for this type of procedure. Post-procedural neurological assessment was performed, showing return to baseline, prior to discharge. The patient was provided with post-procedure discharge instructions, including a section on how to identify potential problems. Should any problems arise concerning this procedure, the patient was given instructions to immediately contact us, at any time, without  hesitation. In any case, we plan to contact the patient by telephone for a follow-up status report regarding this interventional procedure. Comments:  No additional relevant information.  Plan of Care   Imaging Orders     DG C-Arm 1-60 Min-No Report  Procedure Orders     Cervical Epidural Injection  Medications ordered for procedure: Meds ordered this encounter  Medications  . lactated ringers infusion 1,000 mL  . midazolam (VERSED) 5 MG/5ML injection 1-2 mg    Make sure Flumazenil is available in the pyxis when using this medication. If oversedation occurs, administer 0.2 mg IV over 15 sec. If after 45 sec no response, administer 0.2 mg again over 1 min; may repeat at 1 min intervals; not to exceed 4 doses (1 mg)  . fentaNYL (SUBLIMAZE) injection 25-50 mcg    Make sure Narcan is available in the pyxis when using this medication. In the event of respiratory depression (RR< 8/min): Titrate NARCAN (naloxone) in increments of 0.1 to 0.2 mg IV at 2-3 minute intervals, until desired degree of reversal.  . DISCONTD: methylPREDNISolone acetate (DEPO-MEDROL) injection 80 mg  . lidocaine (XYLOCAINE) 2 % (with pres) injection 200 mg  . DISCONTD: ropivacaine (PF) 2 mg/mL (0.2%) (NAROPIN) injection 4 mL  . iopamidol (ISOVUE-M) 41 % intrathecal injection 10 mL  . dexamethasone (DECADRON) injection 10 mg  . ropivacaine (PF) 2  mg/mL (0.2%) (NAROPIN) injection 1 mL  . sodium chloride flush (NS) 0.9 % injection 1 mL  . lidocaine (XYLOCAINE) 2 % (with pres) injection 200 mg   Medications administered: We administered lactated ringers, midazolam, fentaNYL, ropivacaine (PF) 2 mg/mL (0.2%), iopamidol, dexamethasone, ropivacaine (PF) 2 mg/mL (0.2%), sodium chloride flush, and lidocaine.  See the medical record for exact dosing, route, and time of administration.  This SmartLink is deprecated. Use AVSMEDLIST instead to display the medication list for a patient. Disposition: Discharge home  Discharge Date & Time: 01/27/2017; 1553 hrs.   Physician-requested Follow-up: Return for post-procedure eval (2 wks). Future Appointments  Date Time Provider [place]  02/04/2017  1:00 PM [facility] None  02/15/2017  9:15 AM Vevelyn Francois, NP [facility] None   Primary Care Physician: [facility], [place] Location: [facility] Facility Note by: Gaspar Cola, MD Date: 01/27/2017; Time: 5:17 PM  Disclaimer:  Medicine is not an exact science. The only guarantee in medicine is that nothing is guaranteed. It is important to note that the decision to proceed with this intervention was based on the information collected from the patient. The Data and conclusions were drawn from the patient's questionnaire, the interview, and the physical examination. Because the information was provided in large part by the patient, it cannot be guaranteed that it has not been purposely or unconsciously manipulated. Every effort has been made to obtain as much relevant data as possible for this evaluation. It is important to note that the conclusions that lead to this procedure are derived in large part from the available data. Always take into account that the treatment will also be dependent on availability of resources and existing treatment guidelines, considered by other Pain  Management Practitioners as being common knowledge and practice, at the time of the intervention. For Medico-Legal purposes, it is also important to point out that variation in procedural techniques and pharmacological choices are the acceptable norm. The indications, contraindications, technique, and results of the above procedure should only be interpreted and judged by a Board-Certified Interventional Pain Specialist with  extensive familiarity and expertise in the same exact procedure and technique.

## 2017-01-28 ENCOUNTER — Telehealth: Payer: Self-pay

## 2017-01-28 NOTE — Telephone Encounter (Signed)
Post procedure phone call.  LM 

## 2017-02-04 ENCOUNTER — Ambulatory Visit (INDEPENDENT_AMBULATORY_CARE_PROVIDER_SITE_OTHER): Payer: Medicare Other | Admitting: Urology

## 2017-02-04 VITALS — BP 131/80 | HR 80 | Ht 65.0 in | Wt 163.1 lb

## 2017-02-04 DIAGNOSIS — R32 Unspecified urinary incontinence: Secondary | ICD-10-CM | POA: Diagnosis not present

## 2017-02-04 LAB — URINALYSIS, COMPLETE
Bilirubin, UA: NEGATIVE
Ketones, UA: NEGATIVE
NITRITE UA: NEGATIVE
PH UA: 7 (ref 5.0–7.5)
Protein, UA: NEGATIVE
RBC, UA: NEGATIVE
Specific Gravity, UA: 1.015 (ref 1.005–1.030)
Urobilinogen, Ur: 0.2 mg/dL (ref 0.2–1.0)

## 2017-02-04 LAB — MICROSCOPIC EXAMINATION: RBC MICROSCOPIC, UA: NONE SEEN /HPF (ref 0–?)

## 2017-02-04 MED ORDER — MIRABEGRON ER 25 MG PO TB24: 25.0000 mg | ORAL_TABLET | Freq: Every day | ORAL | 11 refills | Status: DC

## 2017-02-04 NOTE — Progress Notes (Signed)
02/04/2017 1:30 PM   Tuesday Terlecki 27-Jan-1961 [id number]  Referring provider: [facility] [address] [place], [place] [address]  Chief Complaint  Patient presents with  . Urinary Incontinence    HPI: The patient is a 56 year old female with past medical history of chronic pain syndrome on multiple medications and implantable devices as well as anxiety with depression who presents today for evaluation of urinary incontinence.  Up until April 2018, she had very minor drips with coughing, sneezing, laughing.  This is only small amounts and was not bothersome.  Around that time she had family issues relating to her husband a significant emotional distress in her life.  This led to significant worsening of her chronic pain syndrome as well as significant worsening of her incontinence.  Since that time she is leave a large volumes of urine both with coughing, laughing, and sneezing as well as with urge.  She notes a lot of time she stands and has a quick urge and then has significant leakage.  She states her emotional state makes it worse.  Nothing makes it better.  She has no other urological complaints at this time.  Of note, the patient had a very labile mood throughout the encounter.  She cried on multiple occasions.  PVR: 0 cc   PMH: Past Medical History:  Diagnosis Date  . Anxiety   . Chronic pain   . GERD (gastroesophageal reflux disease)   . Hypertension     Surgical History: Past Surgical History:  Procedure Laterality Date  . CHOLECYSTECTOMY  1992  . COLONOSCOPY WITH PROPOFOL N/A 08/28/2015   Procedure: COLONOSCOPY WITH PROPOFOL;  Surgeon: [name], MD;  Location: [facility];  Service: Endoscopy;  Laterality: N/A;  PER PT PLEASE LEAVE LAST NEEDS INTERPRETER  . POLYPECTOMY  08/28/2015   Procedure: POLYPECTOMY;  Surgeon: [name], MD;  Location: [facility];  Service: Endoscopy;;  . SHOULDER SURGERY  Right   . SPINAL CORD STIMULATOR IMPLANT Right Jan 2013  . WRIST SURGERY Right    right hand and wrist reconstruction    Home Medications:  Allergies as of 02/04/2017      Reactions   Trazodone And Nefazodone Anaphylaxis   Hydrocodone Nausea And Vomiting      Medication List        Accurate as of 02/04/17  1:30 PM. Always use your most recent med list.          Calcium Plus D3 Absorbable [id number] MG-UNIT Caps Take 1 capsule by mouth daily with breakfast.   calcium-vitamin D 500-200 MG-UNIT tablet Commonly known as:  OSCAL WITH D Take 1 tablet by mouth.   Cholecalciferol 5000 units capsule Take 1 capsule (5,000 Units total) by mouth daily.   cyclobenzaprine 10 MG tablet Commonly known as:  FLEXERIL Take 1 tablet (10 mg total) by mouth 3 (three) times daily as needed.   ergocalciferol 50000 units capsule Commonly known as:  VITAMIN D2 Take 1 capsule (50,000 Units total) by mouth 2 (two) times a week. X 6 weeks.   lisinopril-hydrochlorothiazide 20-12.5 MG tablet Commonly known as:  PRINZIDE,ZESTORETIC Take 1 tablet by mouth daily.   meloxicam 15 MG tablet Commonly known as:  MOBIC Take 1 tablet (15 mg total) by mouth daily.   mirabegron ER 25 MG Tb24 tablet Commonly known as:  MYRBETRIQ Take 1 tablet (25 mg total) by mouth daily.   omeprazole 20 MG capsule Commonly known as:  PRILOSEC Take 20 mg  by mouth 2 (two) times daily.   sertraline 50 MG tablet Commonly known as:  ZOLOFT Take 50 mg by mouth daily.   traMADol 50 MG tablet Commonly known as:  ULTRAM Take 1 tablet (50 mg total) by mouth every 6 (six) hours as needed for severe pain.   VITAMIN B 12 PO Take 1 tablet by mouth daily.       Allergies:  Allergies  Allergen Reactions  . Trazodone And Nefazodone Anaphylaxis  . Hydrocodone Nausea And Vomiting    Family History: Family History  Problem Relation Age of Onset  . Cancer Father   . Heart disease Father     Social History:  reports  that  has never smoked. she has never used smokeless tobacco. She reports that she does not drink alcohol or use drugs.  ROS: UROLOGY Frequent Urination?: Yes Hard to postpone urination?: Yes Burning/pain with urination?: Yes Get up at night to urinate?: Yes Leakage of urine?: Yes Urine stream starts and stops?: No Trouble starting stream?: No Do you have to strain to urinate?: No Blood in urine?: No Urinary tract infection?: No Sexually transmitted disease?: No Injury to kidneys or bladder?: No Painful intercourse?: No Weak stream?: No Currently pregnant?: No Vaginal bleeding?: No Last menstrual period?: n  Gastrointestinal Nausea?: No Vomiting?: No Indigestion/heartburn?: No Diarrhea?: No Constipation?: No  Constitutional Fever: No Night sweats?: No Weight loss?: No Fatigue?: No  Skin Skin rash/lesions?: No Itching?: No  Eyes Blurred vision?: No Double vision?: No  Ears/Nose/Throat Sore throat?: No Sinus problems?: No  Hematologic/Lymphatic Swollen glands?: No Easy bruising?: No  Cardiovascular Leg swelling?: No Chest pain?: No  Respiratory Cough?: No Shortness of breath?: No  Endocrine Excessive thirst?: No  Musculoskeletal Back pain?: No Joint pain?: No  Neurological Headaches?: No Dizziness?: No  Psychologic Depression?: No Anxiety?: No  Physical Exam: BP 131/80   Pulse 80   Ht 5\' 5"  (1.651 m)   Wt 163 lb 1.6 oz (74 kg)   BMI 27.14 kg/m   Constitutional:  Alert and oriented, No acute distress. HEENT: [place] AT, moist mucus membranes.  Trachea midline, no masses. Cardiovascular: No clubbing, cyanosis, or edema. Respiratory: Normal respiratory effort, no increased work of breathing. GI: Abdomen is soft, nontender, nondistended, no abdominal masses GU: No CVA tenderness.  Skin: No rashes, bruises or suspicious lesions. Lymph: No cervical or inguinal adenopathy. Neurologic: Grossly intact, no focal deficits, moving all 4  extremities. Psychiatric: Normal mood and affect.  Laboratory Data: Lab Results  Component Value Date   WBC 7.9 12/12/2016   HGB 13.4 12/12/2016   HCT 40.0 12/12/2016   MCV 83.5 12/12/2016   PLT 242 12/12/2016    Lab Results  Component Value Date   CREATININE 0.62 01/11/2017    No results found for: PSA  No results found for: TESTOSTERONE  No results found for: HGBA1C  Urinalysis No results found for: COLORURINE, APPEARANCEUR, LABSPEC, PHURINE, GLUCOSEU, HGBUR, BILIRUBINUR, KETONESUR, PROTEINUR, UROBILINOGEN, NITRITE, LEUKOCYTESUR  Assessment & Plan:    1.  Mixed urinary incontinence We will start the patient on Myrbetriq for the urge component of her mixed urinary incontinence.  If this is cost prohibitive, we can try an anticholinergic. She will follow-up in 3 months to assess her progress on this. I did discuss with her that targeting her stress incontinence is much more difficult given her medical comorbidities.  We discussed that people with significant emotional stress and uncontrolled chronic pain tend to have much more significant complications from  stress incontinence procedures.  At this time, she is not a very good candidate for any of these procedures.  Hopefully treating her urge component will dramatically improve her urinary quality of life.  I have strongly encouraged her to make her pain management and emotional counseling a priority over her incontinence. She is agreeable with this.    Return in about 3 months (around 05/05/2017).  Nickie Retort, MD  [facility] [address] [place], [place] [address] [phone]

## 2017-02-15 ENCOUNTER — Other Ambulatory Visit: Payer: Self-pay

## 2017-02-15 ENCOUNTER — Ambulatory Visit: Payer: Medicare Other | Attending: Nurse Practitioner | Admitting: Nurse Practitioner

## 2017-02-15 ENCOUNTER — Encounter: Payer: Self-pay | Admitting: Nurse Practitioner

## 2017-02-15 VITALS — BP 126/79 | HR 92 | Temp 98.0°F | Resp 16 | Ht 65.0 in | Wt 149.0 lb

## 2017-02-15 DIAGNOSIS — M150 Primary generalized (osteo)arthritis: Secondary | ICD-10-CM

## 2017-02-15 DIAGNOSIS — M545 Low back pain: Secondary | ICD-10-CM

## 2017-02-15 DIAGNOSIS — M25511 Pain in right shoulder: Secondary | ICD-10-CM | POA: Diagnosis not present

## 2017-02-15 DIAGNOSIS — D125 Benign neoplasm of sigmoid colon: Secondary | ICD-10-CM | POA: Diagnosis not present

## 2017-02-15 DIAGNOSIS — M1990 Unspecified osteoarthritis, unspecified site: Secondary | ICD-10-CM | POA: Insufficient documentation

## 2017-02-15 DIAGNOSIS — Z86010 Personal history of colonic polyps: Secondary | ICD-10-CM | POA: Insufficient documentation

## 2017-02-15 DIAGNOSIS — R32 Unspecified urinary incontinence: Secondary | ICD-10-CM | POA: Diagnosis not present

## 2017-02-15 DIAGNOSIS — M159 Polyosteoarthritis, unspecified: Secondary | ICD-10-CM

## 2017-02-15 DIAGNOSIS — K219 Gastro-esophageal reflux disease without esophagitis: Secondary | ICD-10-CM | POA: Insufficient documentation

## 2017-02-15 DIAGNOSIS — M25512 Pain in left shoulder: Secondary | ICD-10-CM | POA: Diagnosis not present

## 2017-02-15 DIAGNOSIS — F418 Other specified anxiety disorders: Secondary | ICD-10-CM | POA: Insufficient documentation

## 2017-02-15 DIAGNOSIS — M79601 Pain in right arm: Secondary | ICD-10-CM | POA: Insufficient documentation

## 2017-02-15 DIAGNOSIS — I10 Essential (primary) hypertension: Secondary | ICD-10-CM | POA: Insufficient documentation

## 2017-02-15 DIAGNOSIS — G894 Chronic pain syndrome: Secondary | ICD-10-CM | POA: Insufficient documentation

## 2017-02-15 DIAGNOSIS — R51 Headache: Secondary | ICD-10-CM | POA: Diagnosis not present

## 2017-02-15 DIAGNOSIS — E559 Vitamin D deficiency, unspecified: Secondary | ICD-10-CM | POA: Insufficient documentation

## 2017-02-15 DIAGNOSIS — M79602 Pain in left arm: Secondary | ICD-10-CM | POA: Diagnosis not present

## 2017-02-15 DIAGNOSIS — M542 Cervicalgia: Secondary | ICD-10-CM | POA: Insufficient documentation

## 2017-02-15 DIAGNOSIS — M4722 Other spondylosis with radiculopathy, cervical region: Secondary | ICD-10-CM | POA: Diagnosis not present

## 2017-02-15 DIAGNOSIS — M7918 Myalgia, other site: Secondary | ICD-10-CM | POA: Diagnosis not present

## 2017-02-15 DIAGNOSIS — G8929 Other chronic pain: Secondary | ICD-10-CM | POA: Diagnosis not present

## 2017-02-15 DIAGNOSIS — Z79899 Other long term (current) drug therapy: Secondary | ICD-10-CM | POA: Insufficient documentation

## 2017-02-15 MED ORDER — TRAMADOL HCL 50 MG PO TABS: 50.0000 mg | ORAL_TABLET | Freq: Four times a day (QID) | ORAL | 0 refills | Status: DC | PRN

## 2017-02-15 NOTE — Progress Notes (Signed)
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 record: Pharmacy where electronic prescriptions will be sent. If written prescriptions are taken to a different pharmacy, please inform the nursing staff. The pharmacy listed in the electronic medical record should be the one where you would like electronic prescriptions to be sent.  Prescription refills: Only during scheduled appointments. Applies to both, written and electronic prescriptions.  NOTE: The following applies primarily to controlled substances (Opioid* Pain Medications).   Patient's responsibilities: 1. Pain Pills: Bring all pain pills to every appointment (except for procedure appointments). 2. Pill Bottles: Bring pills in original pharmacy bottle. Always bring newest bottle. Bring bottle, even if empty. 3. Medication  refills: You are responsible for knowing and keeping track of what medications you need refilled. The day before your appointment, write a list of all prescriptions that need to be refilled. Bring that list to your appointment and give it to the admitting nurse. Prescriptions will be written only during appointments. If you forget a medication, it will not be "Called in", "Faxed", or "electronically sent". You will need to get another appointment to get these prescribed. 4. Prescription Accuracy: You are responsible for carefully inspecting your prescriptions before leaving our office. Have the discharge nurse carefully go over each prescription with you, before taking them home. Make sure that your name is accurately spelled, that your address is correct. Check the name and dose of your medication to make sure it is accurate. Check the number of pills, and the written instructions to make sure they are clear and accurate. Make sure that you are given enough medication to last until your next medication refill appointment. 5. Taking Medication: Take medication as prescribed. Never take more pills than instructed. Never take medication more frequently than prescribed. Taking less pills or less frequently is permitted and encouraged, when it comes to controlled substances (written prescriptions).  6. Inform other Doctors: Always inform, all of your healthcare providers, of all the medications you take. 7. Pain Medication from other Providers: You are not allowed to accept any additional pain medication from any other Doctor or Healthcare provider. There are two exceptions to this rule. (see below) In the event that you require additional pain medication, you are responsible for notifying us, as stated below. 8. Medication Agreement: You are responsible for carefully reading and following our Medication Agreement. This must be signed before receiving any prescriptions from our practice. Safely store a copy of your  signed Agreement. Violations to the Agreement will result in no further prescriptions. (Additional copies of our Medication Agreement are available upon request.) 9. Laws, Rules, & Regulations: All patients are expected to follow all Federal and [employer], [employer], Rules, [employer]. Ignorance of the Laws does not constitute a valid excuse. The use of any illegal substances is prohibited. 10. Adopted CDC guidelines & recommendations: Target dosing levels will be at or below 60 MME/day.  Use of benzodiazepines** is not recommended.  Exceptions: There are only two exceptions to the rule of not receiving pain medications from other Healthcare Providers. 1. Exception #1 (Emergencies): In the event of an emergency (i.e.: accident requiring emergency care), you are allowed to receive additional pain medication. However, you are responsible for: As soon as you are able, call our office [phone], at any time of the day or night, and leave a message stating your name, the date and nature of the emergency, and the name and dose of the medication prescribed. In the event that your call is answered by a member of our staff, make sure to document and save the date, time, and the name of the person that took your information.  2. Exception #2 (Planned Surgery): In the event that you are scheduled by another doctor or dentist to have any type of surgery or procedure, you are allowed (for a period no longer than 30 days), to receive additional pain medication, for the acute post-op pain. However, in this case, you are responsible for picking up a copy of our "Post-op Pain Management for Surgeons" handout, and giving it to your surgeon or dentist. This document is available at our office, and does not require an appointment to obtain it. Simply go to our office during business hours (Monday-Thursday from 8:00 AM to 4:00 PM) (Friday 8:00 AM to 12:00 Noon) or if you have a scheduled appointment with [place], prior to your  surgery, and ask for it by name. In addition, you will need to provide [place] with your name, name of your surgeon, type of surgery, and date of procedure or surgery.  *Opioid medications include: morphine, codeine, oxycodone, oxymorphone, hydrocodone, hydromorphone, meperidine, tramadol, tapentadol, buprenorphine, fentanyl, methadone. **Benzodiazepine medications include: diazepam (Valium), alprazolam (Xanax), clonazepam (Klonopine), lorazepam (Ativan), clorazepate (Tranxene), chlordiazepoxide (Librium), estazolam (Prosom), oxazepam (Serax), temazepam (Restoril), triazolam (Halcion)  ____________________________________________________________________________________________ For constipation try one of these Benefiber Stool softner (Colace or Peri colace)

## 2017-02-15 NOTE — Patient Instructions (Addendum)
____________________________________________________________________________________________  Medication Rules  Applies to: All patients receiving prescriptions (written or electronic).  Pharmacy of record: Pharmacy where electronic prescriptions will be sent. If written prescriptions are taken to a different pharmacy, please inform the nursing staff. The pharmacy listed in the electronic medical record should be the one where you would like electronic prescriptions to be sent.  Prescription refills: Only during scheduled appointments. Applies to both, written and electronic prescriptions.  NOTE: The following applies primarily to controlled substances (Opioid* Pain Medications).   Patient's responsibilities: 1. Pain Pills: Bring all pain pills to every appointment (except for procedure appointments). 2. Pill Bottles: Bring pills in original pharmacy bottle. Always bring newest bottle. Bring bottle, even if empty. 3. Medication refills: You are responsible for knowing and keeping track of what medications you need refilled. The day before your appointment, write a list of all prescriptions that need to be refilled. Bring that list to your appointment and give it to the admitting nurse. Prescriptions will be written only during appointments. If you forget a medication, it will not be "Called in", "Faxed", or "electronically sent". You will need to get another appointment to get these prescribed. 4. Prescription Accuracy: You are responsible for carefully inspecting your prescriptions before leaving our office. Have the discharge nurse carefully go over each prescription with you, before taking them home. Make sure that your name is accurately spelled, that your address is correct. Check the name and dose of your medication to make sure it is accurate. Check the number of pills, and the written instructions to make sure they are clear and accurate. Make sure that you are given enough medication to  last until your next medication refill appointment. 5. Taking Medication: Take medication as prescribed. Never take more pills than instructed. Never take medication more frequently than prescribed. Taking less pills or less frequently is permitted and encouraged, when it comes to controlled substances (written prescriptions).  6. Inform other Doctors: Always inform, all of your healthcare providers, of all the medications you take. 7. Pain Medication from other Providers: You are not allowed to accept any additional pain medication from any other Doctor or Healthcare provider. There are two exceptions to this rule. (see below) In the event that you require additional pain medication, you are responsible for notifying us, as stated below. 8. Medication Agreement: You are responsible for carefully reading and following our Medication Agreement. This must be signed before receiving any prescriptions from our practice. Safely store a copy of your signed Agreement. Violations to the Agreement will result in no further prescriptions. (Additional copies of our Medication Agreement are available upon request.) 9. Laws, Rules, & Regulations: All patients are expected to follow all Federal and State Laws, Statutes, Rules, & Regulations. Ignorance of the Laws does not constitute a valid excuse. The use of any illegal substances is prohibited. 10. Adopted CDC guidelines & recommendations: Target dosing levels will be at or below 60 MME/day. Use of benzodiazepines** is not recommended.  Exceptions: There are only two exceptions to the rule of not receiving pain medications from other Healthcare Providers. 1. Exception #1 (Emergencies): In the event of an emergency (i.e.: accident requiring emergency care), you are allowed to receive additional pain medication. However, you are responsible for: As soon as you are able, call our office [phone], at any time of the day or night, and leave a message stating your  name, the date and nature of the emergency, and the name and dose of the medication   prescribed. In the event that your call is answered by a member of our staff, make sure to document and save the date, time, and the name of the person that took your information.  2. Exception #2 (Planned Surgery): In the event that you are scheduled by another doctor or dentist to have any type of surgery or procedure, you are allowed (for a period no longer than 30 days), to receive additional pain medication, for the acute post-op pain. However, in this case, you are responsible for picking up a copy of our "Post-op Pain Management for Surgeons" handout, and giving it to your surgeon or dentist. This document is available at our office, and does not require an appointment to obtain it. Simply go to our office during business hours (Monday-Thursday from 8:00 AM to 4:00 PM) (Friday 8:00 AM to 12:00 Noon) or if you have a scheduled appointment with [place], prior to your surgery, and ask for it by name. In addition, you will need to provide [place] with your name, name of your surgeon, type of surgery, and date of procedure or surgery.  *Opioid medications include: morphine, codeine, oxycodone, oxymorphone, hydrocodone, hydromorphone, meperidine, tramadol, tapentadol, buprenorphine, fentanyl, methadone. **Benzodiazepine medications include: diazepam (Valium), alprazolam (Xanax), clonazepam (Klonopine), lorazepam (Ativan), clorazepate (Tranxene), chlordiazepoxide (Librium), estazolam (Prosom), oxazepam (Serax), temazepam (Restoril), triazolam (Halcion)  ____________________________________________________________________________________________ For constipation try one of these Benefiber Stool softner (Colace or Peri colace)

## 2017-02-15 NOTE — Progress Notes (Signed)
Nursing Pain Medication Assessment:  Safety precautions to be maintained throughout the outpatient stay will include: orient to surroundings, keep bed in low position, maintain call bell within reach at all times, provide assistance with transfer out of bed and ambulation.  Medication Inspection Compliance: Pill count conducted under aseptic conditions, in front of the patient. Neither the pills nor the bottle was removed from the patient's sight at any time. Once count was completed pills were immediately returned to the patient in their original bottle.  Medication: See above Pill/Patch Count: 54 of 90 pills remain Pill/Patch Appearance: Markings consistent with prescribed medication Bottle Appearance: Standard pharmacy container. Clearly labeled. Filled Date: 18 / 12 / 2018 Last Medication intake:  Yesterday

## 2017-02-21 ENCOUNTER — Ambulatory Visit: Payer: Medicare Other | Admitting: Pain Medicine

## 2017-04-11 ENCOUNTER — Other Ambulatory Visit: Payer: Self-pay

## 2017-04-11 ENCOUNTER — Ambulatory Visit: Payer: Medicare Other | Attending: Nurse Practitioner | Admitting: Nurse Practitioner

## 2017-04-11 ENCOUNTER — Encounter: Payer: Self-pay | Admitting: Nurse Practitioner

## 2017-04-11 VITALS — BP 146/74 | HR 90 | Temp 98.5°F | Resp 16 | Ht 65.0 in | Wt 152.0 lb

## 2017-04-11 DIAGNOSIS — M159 Polyosteoarthritis, unspecified: Secondary | ICD-10-CM

## 2017-04-11 DIAGNOSIS — M1990 Unspecified osteoarthritis, unspecified site: Secondary | ICD-10-CM | POA: Insufficient documentation

## 2017-04-11 DIAGNOSIS — M4722 Other spondylosis with radiculopathy, cervical region: Secondary | ICD-10-CM | POA: Diagnosis not present

## 2017-04-11 DIAGNOSIS — D125 Benign neoplasm of sigmoid colon: Secondary | ICD-10-CM | POA: Insufficient documentation

## 2017-04-11 DIAGNOSIS — I10 Essential (primary) hypertension: Secondary | ICD-10-CM | POA: Diagnosis not present

## 2017-04-11 DIAGNOSIS — G8929 Other chronic pain: Secondary | ICD-10-CM | POA: Diagnosis not present

## 2017-04-11 DIAGNOSIS — R32 Unspecified urinary incontinence: Secondary | ICD-10-CM | POA: Insufficient documentation

## 2017-04-11 DIAGNOSIS — M150 Primary generalized (osteo)arthritis: Secondary | ICD-10-CM

## 2017-04-11 DIAGNOSIS — K219 Gastro-esophageal reflux disease without esophagitis: Secondary | ICD-10-CM | POA: Insufficient documentation

## 2017-04-11 DIAGNOSIS — M25511 Pain in right shoulder: Secondary | ICD-10-CM | POA: Diagnosis present

## 2017-04-11 DIAGNOSIS — M7918 Myalgia, other site: Secondary | ICD-10-CM | POA: Insufficient documentation

## 2017-04-11 DIAGNOSIS — Z79899 Other long term (current) drug therapy: Secondary | ICD-10-CM | POA: Insufficient documentation

## 2017-04-11 DIAGNOSIS — F418 Other specified anxiety disorders: Secondary | ICD-10-CM | POA: Insufficient documentation

## 2017-04-11 DIAGNOSIS — M25512 Pain in left shoulder: Secondary | ICD-10-CM | POA: Insufficient documentation

## 2017-04-11 DIAGNOSIS — G894 Chronic pain syndrome: Secondary | ICD-10-CM | POA: Insufficient documentation

## 2017-04-11 DIAGNOSIS — Z86010 Personal history of colonic polyps: Secondary | ICD-10-CM | POA: Insufficient documentation

## 2017-04-11 DIAGNOSIS — E559 Vitamin D deficiency, unspecified: Secondary | ICD-10-CM | POA: Diagnosis not present

## 2017-04-11 DIAGNOSIS — Z9689 Presence of other specified functional implants: Secondary | ICD-10-CM | POA: Diagnosis not present

## 2017-04-11 DIAGNOSIS — M542 Cervicalgia: Secondary | ICD-10-CM | POA: Diagnosis present

## 2017-04-11 NOTE — Progress Notes (Signed)
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 day of the procedure. If it persists, apply moist heat in the day(s) following.  The side effects listed above can be normal. They are not dangerous and will resolve on their own. If, however, you experience any of the following, a complication may have occurred and you should either contact your doctor. If he is not readily available, then you should proceed to the closest urgent care center for evaluation: . Severe or progressive pain at the injection site(s) . Arm or leg weakness that progressively worsens or persists for longer than 8 hours . Severe or progressive redness, swelling, or discharge from the injections site(s) . Fevers, chills, nausea, or vomiting . Bowel or bladder dysfunction (i.e.  inability to urinate or pass stool or difficulty controlling either)  How long does it take for the procedure to work? You should feel relief from your usual pain within the first hour. Again, this is only expected to last for several hours, at the most. Remember, you may be sore in the middle part of your back from the needles, and you must distinguish this from your usual pain. ____________________________________________________________________________________________  Beverly Sessions sobre violncia domstica (Domestic Violence Information) [place]  VIOLNCIA DOMSTICA? A violncia domstica, tambm chamada "violncia de parceiro ntimo", pode envolver abuso fsico, emocional, psicolgico, sexual e econmico de parte de um parceiro ntimo atual ou antigo. A perseguio (stalking) tambm  considerada um tipo de violncia domstica. A violncia domstica pode acontecer entre pessoas que:  Foram casadas.  Namoraram.  Moraram juntas. Os autores do abuso agem repetidamente de maneira a Catering manager o controle e o poder sobre o(a) parceiro(a). O abuso fsico pode incluir:  Tapas.  Golpes.  Chutes.  Socos.  Estrangulamento.  [place].  Danificar propriedade da vtima.  Ameaar ferir ou ferir a vtima com armas.  Confinar a pessoa na prpria casa.  Forar a vtima a Transport planner. O abuso emocional e psicolgico pode incluir:  Ameaas.  Insultos.  Isolamento.  [place]il[place].  Cime e possessividade.  Acusaes.  Negao de afeto.  Intimidao.  Manipulao.  Limitao do contato com amigos e parentes. O abuso sexual pode incluir:  Sexo forado.  Forar carcias sexuais.  Ferir o(a) parceiro(a) durante o ato sexual.  Dannielle Karvonen a vtima a fazer sexo com outras pessoas.  Transmitir deliberadamente  vtima uma doena sexualmente transmissvel (DST). O abuso econmico pode incluir:  Controlar recursos, tais como dinheiro, alimentos, meios de  transporte, telefones ou computadores.  Furtar dinheiro da vtima ou de parentes ou amigos dela.  Impedir a vtima de trabalhar.  Recusar-se a trabalhar ou contribuir no lar. A perseguio (stalking) pode incluir:  Fazer repetidos telefonemas ou enviar e-mails ou mensagens de texto indesejadas repetidamente.  Deixar cartes, cartas, flores ou outros itens que a vitima no deseja.  Observar ou seguir a vtima  distncia.  Ir a lugares nos quais a vtima no quer o(a) autor(a) do abuso.  Entrar na casa ou no carro da vtima.  Danificar a propriedade pessoal da vtima. QUAIS SO  [place]? Sinais fsicos  Hematomas.  Ossos quebrados.  Queimaduras ou cortes.  Dor fsica.  Leso na cabea. Sinais emocionais e psicolgicos  Choro.  Depresso.  Desesperana.  Desespero.  Dificuldade para dormir.  Medo do(a) parceiro(a).  Ansiedade.  Comportamento suicida.  Comportamento antissocial.  Sharyn Blitz.  Medo de intimidade.  Flashbacks. Sinais sexuais  Hematomas, inchao ou sangramento na rea genital ou retal.  Sinais de doenas sexualmente transmissveis, tais como feridas, verrugas ou secrees na rea genital.  Dor na rea genital.  Gravidez indesejada.  Problemas na gravidez, incluindo retardo do tratamento pr-natal e prematuridade. Sinais econmicos  Ter pouco dinheiro ou alimento.  Falta de moradia.  Pedir dinheiro ou pedir dinheiro emprestado. QUAIS SO COMPORTAMENTOS COMUNS DE PESSOAS AFETADAS [place]? As pessoas afetadas pela violncia domstica podem:  Chegar tarde no trabalho ou outros eventos.  No aparecer em lugares conforme prometido.  Ter que informar ao parceiro onde esto e com quem esto.  Ficar isoladas ou ser impedidas de ver amigos e parentes.  Fazer comentrios sobre o temperamento ou comportamento do(a) parceiro(a).  Oferecer desculpas pelo (a)  parceiro(a).  Demonstrar comportamento sexual de alto risco.  Usar drogas ou lcool.  Apresentar comportamentos no saudveis relacionados  dieta. QUAIS SO SENTIMENTOS COMUNS DE PESSOAS AFETADAS [place]? As pessoas afetadas pela violncia domstica podem achar que:  Precisam ter cuidado para no dizer ou fazer coisas que possam provocar a raiva do(a) parceiro(a).  No conseguem fazer nada direito.  Merecem ser [employer].  Esto reagindo de maneira exagerada ao comportamento ou temperamento do(a) parceiro(a).  No podem confiar nos prprios sentimentos.  No podem confiar em outras pessoas.  Esto em uma Janene Harvey) parceiro(a) levaria seus filhos embora.  Esto emocionalmente exauridas ou apticas.  Sua vida est em perigo.  Precisariam matar o(a) parceiro(a) para sobreviver. [place]? Linhas diretas e websites sobre violncia domstica Caso voc no se sinta seguro(a) para buscar ajuda online de casa, use um computador de uma biblioteca pblica para acessar a internet. Ligue para 911 caso esteja em perigo imediato ou precise de assistncia mdica.  A National Domestic Violence Hotline ([place] para [place]). ? Os nmeros de atendimento [address] horas [place] so [phone] e [phone] (TTY). ? O videofone est disponvel de segunda a sexta, de 9:00 s 17:00. Ligue para [phone]. ? O website  TerrificApps.com.br.  A National Sexual Assault Hotline ([place] para Agresso Sexual). ? O telefone de atendimento 24 horas da linha direta  [phone]. ? Voc pode acessar a linha direta online em http://www.dixon.info/ Abrigos para vtimas de violncia domstica Caso voc seja vtima de violncia domstica, h recursos para ajudar voc a Geographical information systems officer temporrio para voc e seus filhos viverem (abrigo). O endereo especfico desses abrigos com frequncia no   disponibilizado para o pblico. Polcia Comunique agresses, ameaas e perseguies (stalking)  polcia. Conselheiros e centros de aconselhamento Pessoas que foram vtimas de violncia domstica podem se beneficiar de aconselhamento. Aconselhamento pode ajudar voc a lidar com emoes difceis e lhe dar fora para planejar com segurana o seu futuro. Os assuntos que voc discutir com o(a) conselheiro(a) so privados e confidenciais. Filhos de vtimas de violncia domstica tambm podem precisar de aconselhamento para lidar com o estresse e a ansiedade. O sistema judicirio Voc pode trabalhar com um(a) advogado(a) para obter proteo legal contra o(a) autor(a) do abuso. Essa proteo inclui aes de afastamento (restraining orders) e  manuteno de endereos sob sigilo. Crimes contra voc, tais como agresso, tambm podem ser [employer]. As leis variam de [place] para [place]. Estas informaes no se destinam a substituir as recomendaes de seu mdico. No deixe de discutir quaisquer dvidas com seu mdico. Document Released: 06/11/2014 Document Revised: 06/11/2014 Document Reviewed: 10/12/2013 Elsevier Interactive Patient Education  2018 [employer].

## 2017-04-11 NOTE — Patient Instructions (Addendum)
____________________________________________________________________________________________ ° °Genicular Nerve Block ° °What is a genicular nerve block? °A genicular nerve block is the injection of a local anesthetic to block the nerves that transmits pain from the knee. ° °What is the purpose of a facet nerve block? °A genicular nerve block is a diagnostic procedure to determine if the pathologic changes (i.e. arthritis, meniscal tears, etc) and inflammation within the knee joint is the source of your knee pain. It also confirms that the knee pain will respond well to the actual treatment procedure. If a genicular nerve block works, it will give you relief for several hours. After that, the pain is expected to return to normal. This test is always performed twice (usually a week or two apart) because two successful tests are required to move onto treatment. If both diagnostic tests are positive, then we schedule a treatment called radiofrequency (RF) ablation. In this procedure, the same nerves are cauterized, which typically leads to pain relief for 4 -18 months. If this process works well for one knee, it can be performed on the other knee if needed. ° °How is the procedure performed? °You will be placed on the procedure table. The injection site is sterilized with either iodine or chlorhexadine. The site to be injected is numbed with a local anesthetic, and a needle is directed to the target area. X-ray guidance is used to ensure proper placement and positioning of the needle. When the needle is properly positioned near the genicular nerve, local anesthetic is injected to numb that nerve. This will be repeated at multiple sites around the knee to block all genicular nerves. ° °Will the procedure be painful? °The injection can be painful and we therefore provide the option of receiving IV sedation. IV sedation, combined with local anesthetic, can make the injection nearly pain free. It allows you to remain very  still during the procedure, which can also make the injection easier, faster, and more successful. If you decide to have IV sedation, you must have a driver to get you home safely afterwards. In addition, you cannot have anything to eat or drink within 8 hours of your appointment (clear liquids are allowed until 3 hours before the procedure). If you take medications for diabetes, these medications may need to be adjusted the morning of the procedure. Your primary care physician can help you with this adjustment. ° °What are the discharge instructions? °If you received IV sedation do not drive or operate machinery for at least 24 hours after the procedure. You may return to work the next day following your procedure. You may resume your normal diet immediately. Do not engage in any strenuous activity for 24 hours. You should, however, engage in moderate activity that typically causes your ususal pain. If the block works, those activities should not be painful for several hours after the injection. Do not take a bath, swim, or use a hot tub for 24 hours (you may take a shower). Call the office if you have any of the following: severe pain afterwards (different than your usual symptoms), redness/swelling/discharge at the injection site(s), fevers/chills, difficulty with bowel or bladder functions. ° °What are the risks and side effects? °The complication rate for this procedure is very low. Whenever a needle enters the skin, bleeding or infection can occur. Some other serious but extremely rare risks include paralysis and death. °You may have an allergic reaction to any of the medications used. If you have a known allergy to any medications, especially local anesthetics, notify   our staff before the procedure takes place. You may experience any of the following side effects up to 4 - 6 hours after the procedure:  Leg muscle weakness or numbness may occur due to the local anesthetic affecting the nerves that control  your legs (this is a temporary affect and it is not paralysis). If you have any leg weakness or numbness, walk only with assistance in order to prevent falls and injury. Your leg strength will return slowly and completely.  Dizziness may occur due to a decrease in your blood pressure. If this occurs, remain in a seated or lying position. Gradually sit up, and then stand after at least 10 minutes of sitting.  Mild headaches may occur. Drink fluids and take pain medications if needed. If the headaches persist or become severe, call the office.  Mild discomfort at the injection site can occur. This typically lasts for a few hours but can persist for a couple days. If this occurs, take anti-inflammatories or pain medications, apply ice to the area the day of the procedure. If it persists, apply moist heat in the day(s) following.  The side effects listed above can be normal. They are not dangerous and will resolve on their own. If, however, you experience any of the following, a complication may have occurred and you should either contact your doctor. If he is not readily available, then you should proceed to the closest urgent care center for evaluation:  Severe or progressive pain at the injection site(s)  Arm or leg weakness that progressively worsens or persists for longer than 8 hours  Severe or progressive redness, swelling, or discharge from the injections site(s)  Fevers, chills, nausea, or vomiting  Bowel or bladder dysfunction (i.e. inability to urinate or pass stool or difficulty controlling either)  How long does it take for the procedure to work? You should feel relief from your usual pain within the first hour. Again, this is only expected to last for several hours, at the most. Remember, you may be sore in the middle part of your back from the needles, and you must distinguish this from your usual  pain. ____________________________________________________________________________________________  Beverly Sessions sobre violncia domstica (Domestic Violence Information) [place]  VIOLNCIA DOMSTICA? A violncia domstica, tambm chamada "violncia de parceiro ntimo", pode envolver abuso fsico, emocional, psicolgico, sexual e econmico de parte de um parceiro ntimo atual ou antigo. A perseguio (stalking) tambm  considerada um tipo de violncia domstica. A violncia domstica pode acontecer entre pessoas que:  Foram casadas.  Namoraram.  Moraram juntas. Os autores do abuso agem repetidamente de maneira a Catering manager o controle e o poder sobre o(a) parceiro(a). O abuso fsico pode incluir:  Tapas.  Golpes.  Chutes.  Socos.  Estrangulamento.  [place].  Danificar propriedade da vtima.  Ameaar ferir ou ferir a vtima com armas.  Confinar a pessoa na prpria casa.  Forar a vtima a Transport planner. O abuso emocional e psicolgico pode incluir:  Ameaas.  Insultos.  Isolamento.  [place].  Cime e possessividade.  Acusaes.  Negao de afeto.  Intimidao.  Manipulao.  Limitao do contato com amigos e parentes. O abuso sexual pode incluir:  Sexo forado.  Forar carcias sexuais.  Ferir o(a) parceiro(a) durante o ato sexual.  Dannielle Karvonen a vtima a fazer sexo com outras pessoas.  Transmitir deliberadamente  vtima uma doena sexualmente transmissvel (DST). O abuso econmico pode incluir:  Controlar recursos, tais como dinheiro, alimentos, meios de transporte, telefones ou computadores.  Furtar dinheiro da vtima ou de parentes ou amigos dela.  Impedir a vtima de trabalhar.  Recusar-se a trabalhar ou contribuir no lar. A perseguio (stalking) pode incluir:  Fazer repetidos telefonemas ou enviar e-mails ou mensagens de texto indesejadas repetidamente.  Deixar cartes, cartas, flores ou outros itens que a vitima  no deseja.  Observar ou seguir a vtima  distncia.  Ir a lugares nos quais a vtima no quer o(a) autor(a) do abuso.  Entrar na casa ou no carro da vtima.  Danificar a propriedade pessoal da vtima. QUAIS SO ALGUNS SINAIS DE Kathalene Frames DE [place]? Sinais fsicos  Hematomas.  Ossos quebrados.  Queimaduras ou cortes.  Dor fsica.  Leso na cabea. Sinais emocionais e psicolgicos  Choro.  Depresso.  Desesperana.  Desespero.  Dificuldade para dormir.  Medo do(a) parceiro(a).  Ansiedade.  Comportamento suicida.  Comportamento antissocial.  Sharyn Blitz.  Medo de intimidade.  Flashbacks. Sinais sexuais  Hematomas, inchao ou sangramento na rea genital ou retal.  Sinais de doenas sexualmente transmissveis, tais como feridas, verrugas ou secrees na rea genital.  Dor na rea genital.  Gravidez indesejada.  Problemas na gravidez, incluindo retardo do tratamento pr-natal e prematuridade. Sinais econmicos  Ter pouco dinheiro ou alimento.  Falta de moradia.  Pedir dinheiro ou pedir dinheiro emprestado. QUAIS SO COMPORTAMENTOS COMUNS DE PESSOAS AFETADAS [place]? As pessoas afetadas pela violncia domstica podem:  Chegar tarde no trabalho ou outros eventos.  No aparecer em lugares conforme prometido.  Ter que informar ao parceiro onde esto e com quem esto.  Ficar isoladas ou ser impedidas de ver amigos e parentes.  Fazer comentrios sobre o temperamento ou comportamento do(a) parceiro(a).  Oferecer desculpas pelo (a) parceiro(a).  Demonstrar comportamento sexual de alto risco.  Usar drogas ou lcool.  Apresentar comportamentos no saudveis relacionados  dieta. QUAIS SO SENTIMENTOS COMUNS DE PESSOAS AFETADAS [place]? As pessoas afetadas pela violncia domstica podem achar que:  Precisam ter cuidado para no dizer ou fazer coisas que possam provocar a raiva do(a)  parceiro(a).  No conseguem fazer nada direito.  Merecem ser [employer].  Esto reagindo de maneira exagerada ao comportamento ou temperamento do(a) parceiro(a).  No podem confiar nos prprios sentimentos.  No podem confiar em outras pessoas.  Esto em uma Janene Harvey) parceiro(a) levaria seus filhos embora.  Esto emocionalmente exauridas ou apticas.  Sua vida est em perigo.  Precisariam matar o(a) parceiro(a) para sobreviver. [place]? Linhas diretas e websites sobre violncia domstica Caso voc no se sinta seguro(a) para buscar ajuda online de casa, use um computador de uma biblioteca pblica para acessar a internet. Ligue para 911 caso esteja em perigo imediato ou precise de assistncia mdica.  A National Domestic Violence Hotline ([place] para [place]). ? Os nmeros de atendimento [address] horas [place] so [phone] e [phone] (TTY). ? O videofone est disponvel de segunda a sexta, de 9:00 s 17:00. Ligue para [phone]. ? O website  TerrificApps.com.br.  A National Sexual Assault Hotline ([place] para Agresso Sexual). ? O telefone de atendimento 24 horas da linha direta  [phone]. ? Voc pode acessar a linha direta online em http://www.dixon.info/ Abrigos para vtimas de violncia domstica Caso voc seja vtima de violncia domstica, h recursos para ajudar voc a Geographical information systems officer temporrio para voc e seus filhos viverem (abrigo). O endereo especfico desses abrigos com frequncia no  disponibilizado para o pblico. Polcia Comunique agresses, ameaas e perseguies (stalking)  polcia.  Conselheiros e centros de aconselhamento Pessoas que foram vtimas de violncia domstica podem se beneficiar de aconselhamento. Aconselhamento pode ajudar voc a lidar com emoes difceis e lhe dar fora para planejar com segurana o seu futuro. Os assuntos que voc  discutir com o(a) conselheiro(a) so privados e confidenciais. Filhos de vtimas de violncia domstica tambm podem precisar de aconselhamento para lidar com o estresse e a ansiedade. O sistema judicirio Voc pode trabalhar com um(a) advogado(a) para obter proteo legal contra o(a) autor(a) do abuso. Essa proteo inclui aes de afastamento (restraining orders) e manuteno de endereos sob sigilo. Crimes contra voc, tais como agresso, tambm podem ser [employer]. As leis variam de [place] para [place]. Estas informaes no se destinam a substituir as recomendaes de seu mdico. No deixe de discutir quaisquer dvidas com seu mdico. Document Released: 06/11/2014 Document Revised: 06/11/2014 Document Reviewed: 10/12/2013 Elsevier Interactive Patient Education  2018 [employer].

## 2017-04-11 NOTE — Progress Notes (Signed)
Nursing Pain Medication Assessment:  Safety precautions to be maintained throughout the outpatient stay will include: orient to surroundings, keep bed in low position, maintain call bell within reach at all times, provide assistance with transfer out of bed and ambulation.  Medication Inspection Compliance: Pill count conducted under aseptic conditions, in front of the patient. Neither the pills nor the bottle was removed from the patient's sight at any time. Once count was completed pills were immediately returned to the patient in their original bottle.  Medication #1: Tramadol (Ultram) Pill/Patch Count: 28 of 90 pills remain Pill/Patch Appearance: Markings consistent with prescribed medication Bottle Appearance: Standard pharmacy container. Clearly labeled. Filled Date: 22 / 12 / 2018 Last Medication intake:  Yesterday  Medication #2: Tramadol (Ultram) Pill/Patch Count: 87 of 90 pills remain Pill/Patch Appearance: Markings consistent with prescribed medication Bottle Appearance: Standard pharmacy container. Clearly labeled. Filled Date: 1 / 17 / 2019 Last Medication intake:  pt doesn"t remember

## 2017-05-06 ENCOUNTER — Ambulatory Visit: Payer: Medicare Other

## 2017-06-02 ENCOUNTER — Encounter: Payer: Self-pay | Admitting: Urology

## 2017-06-02 ENCOUNTER — Ambulatory Visit (INDEPENDENT_AMBULATORY_CARE_PROVIDER_SITE_OTHER): Payer: Medicare Other | Admitting: Urology

## 2017-06-02 VITALS — BP 144/75 | HR 80 | Ht 66.0 in | Wt 152.0 lb

## 2017-06-02 DIAGNOSIS — N3946 Mixed incontinence: Secondary | ICD-10-CM

## 2017-06-02 MED ORDER — OXYBUTYNIN CHLORIDE 5 MG PO TABS: 5.0000 mg | ORAL_TABLET | Freq: Three times a day (TID) | ORAL | 2 refills | Status: DC

## 2017-06-02 NOTE — Progress Notes (Signed)
06/02/2017 8:14 AM   Alyssa Wood 1960/03/19 [id number]  Referring provider: [facility] [address] [place], [place] [address]  Chief Complaint  Patient presents with  . Urinary Incontinence    69month    HPI: 57 yo F who returns today for routine 3 month follow up for mixed urinary incontinence.    Since last visit, she was started on mybetriq 25 mg for her overactivity symptoms.   She has no medical insurance coverage for prescription medications.  She was able to take the medication for 1 month with samples and noted no significant change in her symptoms while on the medication.  Was otherwise well-tolerated.  She continues to be very bothered by her urinary symptoms and is wearing several pads a day along with liners.  She describes ongoing both urge and stress incontinence.  She had 4 vaginal deliveries.  She denies any significant pain with intercourse other than burning.  No vaginal bulging.  She also has a history of chronic pain, anxiety, and stressors which are exacerbating her symptoms.     PMH: Past Medical History:  Diagnosis Date  . Anxiety   . Chronic pain   . GERD (gastroesophageal reflux disease)   . Hypertension     Surgical History: Past Surgical History:  Procedure Laterality Date  . CHOLECYSTECTOMY  1992  . COLONOSCOPY WITH PROPOFOL N/A 08/28/2015   Procedure: COLONOSCOPY WITH PROPOFOL;  Surgeon: [name], MD;  Location: [facility];  Service: Endoscopy;  Laterality: N/A;  PER PT PLEASE LEAVE LAST NEEDS INTERPRETER  . POLYPECTOMY  08/28/2015   Procedure: POLYPECTOMY;  Surgeon: [name], MD;  Location: [facility];  Service: Endoscopy;;  . SHOULDER SURGERY Right   . SPINAL CORD STIMULATOR IMPLANT Right Jan 2013  . WRIST SURGERY Right    right hand and wrist reconstruction    Home Medications:  Allergies as of 06/02/2017      Reactions   Trazodone And Nefazodone Anaphylaxis    Hydrocodone Nausea And Vomiting      Medication List        Accurate as of 06/02/17 11:59 PM. Always use your most recent med list.          calcium-vitamin D 500-200 MG-UNIT tablet Commonly known as:  OSCAL WITH D Take 1 tablet by mouth.   docusate sodium 100 MG capsule Commonly known as:  COLACE Take 100 mg by mouth daily.   lisinopril-hydrochlorothiazide 20-12.5 MG tablet Commonly known as:  PRINZIDE,ZESTORETIC Take 1 tablet by mouth daily.   meloxicam 15 MG tablet Commonly known as:  MOBIC Take 1 tablet (15 mg total) by mouth daily.   mirabegron ER 25 MG Tb24 tablet Commonly known as:  MYRBETRIQ Take 1 tablet (25 mg total) by mouth daily.   omeprazole 20 MG capsule Commonly known as:  PRILOSEC Take 20 mg by mouth 2 (two) times daily.   oxybutynin 5 MG tablet Commonly known as:  DITROPAN Take 1 tablet (5 mg total) by mouth 3 (three) times daily.   sertraline 50 MG tablet Commonly known as:  ZOLOFT Take 50 mg by mouth daily.   traMADol 50 MG tablet Commonly known as:  ULTRAM Take 1 tablet (50 mg total) by mouth every 6 (six) hours as needed for severe pain.   VITAMIN B 12 PO Take 1 tablet by mouth daily.       Allergies:  Allergies  Allergen Reactions  . Trazodone And Nefazodone Anaphylaxis  .  Hydrocodone Nausea And Vomiting    Family History: Family History  Problem Relation Age of Onset  . Cancer Father   . Heart disease Father     Social History:  reports that she has never smoked. She has never used smokeless tobacco. She reports that she does not drink alcohol or use drugs.  ROS: UROLOGY Frequent Urination?: Yes Hard to postpone urination?: Yes Burning/pain with urination?: Yes Get up at night to urinate?: Yes Leakage of urine?: Yes Urine stream starts and stops?: No Trouble starting stream?: Yes Do you have to strain to urinate?: Yes Blood in urine?: No Urinary tract infection?: No Sexually transmitted disease?: No Injury to  kidneys or bladder?: No Painful intercourse?: Yes Weak stream?: No Currently pregnant?: No Vaginal bleeding?: No Last menstrual period?: n  Gastrointestinal Nausea?: Yes Vomiting?: No Indigestion/heartburn?: Yes Diarrhea?: No Constipation?: Yes  Constitutional Fever: No Night sweats?: No Weight loss?: Yes Fatigue?: Yes  Skin Skin rash/lesions?: No Itching?: No  Eyes Blurred vision?: Yes Double vision?: No  Ears/Nose/Throat Sore throat?: Yes Sinus problems?: No  Hematologic/Lymphatic Swollen glands?: Yes Easy bruising?: No  Cardiovascular Leg swelling?: No Chest pain?: No  Respiratory Cough?: No Shortness of breath?: Yes  Endocrine Excessive thirst?: Yes  Musculoskeletal Back pain?: Yes Joint pain?: Yes  Neurological Headaches?: No Dizziness?: No  Psychologic Depression?: Yes Anxiety?: Yes  Physical Exam: BP (!) 144/75   Pulse 80   Ht 5\' 6"  (1.676 m)   Wt 152 lb (68.9 kg)   BMI 24.53 kg/m   Constitutional:  Alert and oriented, No acute distress.  Accompanied today by [place] translator. HEENT: [place] AT, moist mucus membranes.  Trachea midline, no masses. Cardiovascular: No clubbing, cyanosis, or edema. Respiratory: Normal respiratory effort, no increased work of breathing. Pelvic: Normal external genitalia.  Normal urethral meatus with some hypermobility noted with Valsalva but no demonstrable stress incontinence.  She does have a grade 1/2 cystocele with Valsalva as well as a small rectocele.  There is also some apical descent with Valsalva. Skin: No rashes, bruises or suspicious lesions. Neurologic: Grossly intact, no focal deficits, moving all 4 extremities. Psychiatric: Normal mood and affect.  Laboratory Data: Lab Results  Component Value Date   WBC 7.9 12/12/2016   HGB 13.4 12/12/2016   HCT 40.0 12/12/2016   MCV 83.5 12/12/2016   PLT 242 12/12/2016    Lab Results  Component Value Date   CREATININE 0.62 01/11/2017     Urinalysis    Component Value Date/Time   APPEARANCEUR Clear 06/02/2017 1337   GLUCOSEU Negative 06/02/2017 1337   BILIRUBINUR Negative 06/02/2017 1337   PROTEINUR Negative 06/02/2017 1337   NITRITE Negative 06/02/2017 1337   LEUKOCYTESUR Trace (A) 06/02/2017 1337    Lab Results  Component Value Date   LABMICR See below: 06/02/2017   WBCUA 0-5 06/02/2017   RBCUA None seen 06/02/2017   LABEPIT 0-10 06/02/2017   MUCUS Present (A) 06/02/2017   BACTERIA Few (A) 06/02/2017    Pertinent Imaging: NA   Assessment & Plan:    1. Mixed stress and urge urinary incontinence Ongoing bothersome mixed incontinence We discussed the option of physical therapy today for her stress incontinence component to it she was agreeable Behavioral modification was discussed Given lack of benefit of Myrbetriq as well as the cost, will switch to oxybutynin 3 times daily as this is more affordable, advised to start Colace with this medication given the side effect of constipation Small amount of pelvic organ prolapse appreciated today but  is likely not contributing to her above symptoms We will reassess symptoms in 3 months  Return in about 3 months (around 09/01/2017) for Dr. Matilde Sprang.  Hollice Espy, MD  [facility] [address], [place], [place] [address] [phone]  I spent 25 min with this patient of which greater than 50% was spent in counseling and coordination of care with the patient.

## 2017-06-03 ENCOUNTER — Ambulatory Visit: Payer: Medicare Other

## 2017-06-03 LAB — MICROSCOPIC EXAMINATION: RBC, UA: NONE SEEN /hpf (ref 0–2)

## 2017-06-03 LAB — URINALYSIS, COMPLETE
BILIRUBIN UA: NEGATIVE
GLUCOSE, UA: NEGATIVE
Ketones, UA: NEGATIVE
Nitrite, UA: NEGATIVE
PROTEIN UA: NEGATIVE
RBC UA: NEGATIVE
SPEC GRAV UA: 1.01 (ref 1.005–1.030)
Urobilinogen, Ur: 0.2 mg/dL (ref 0.2–1.0)
pH, UA: 7.5 (ref 5.0–7.5)

## 2017-06-16 ENCOUNTER — Ambulatory Visit: Payer: Medicare Other | Attending: Urology | Admitting: Physical Therapy

## 2017-06-16 ENCOUNTER — Other Ambulatory Visit: Payer: Self-pay

## 2017-06-16 ENCOUNTER — Encounter: Payer: Self-pay | Admitting: Physical Therapy

## 2017-06-16 DIAGNOSIS — M6281 Muscle weakness (generalized): Secondary | ICD-10-CM | POA: Insufficient documentation

## 2017-06-16 DIAGNOSIS — R279 Unspecified lack of coordination: Secondary | ICD-10-CM | POA: Diagnosis present
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
## 2017-06-20 NOTE — Addendum Note (Signed)
Addended by: Jerl Mina on: 06/20/2017 01:44 PM   Modules accepted: Orders

## 2017-06-20 NOTE — Patient Instructions (Signed)
Breathing   Deep core level 1    Avoid straining pelvic floor, abdominal muscles , spine  Use log rolling technique instead of getting out of bed with your neck or the sit-up   Log rolling out of .bed  L  arm overhead  Raise hips and scoot hips to R   Drop knees to L,  scooting L shoulder back to get completely on your L side so your shoulders, hips, and knees point to the L    Then breathe as you drop feet off bed and prop onto L elbow and  use both hands to push yourself

## 2017-06-27 ENCOUNTER — Telehealth: Payer: Self-pay | Admitting: Pain Medicine
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
## 2017-06-27 NOTE — Telephone Encounter (Signed)
I am routing this patient call to Dr. Dossie Arbour and I will personally tell him about it so that he can address it.

## 2017-06-28 ENCOUNTER — Ambulatory Visit: Payer: Medicare Other | Admitting: Physical Therapy

## 2017-07-07 ENCOUNTER — Ambulatory Visit: Payer: Medicare Other | Admitting: Physical Therapy

## 2017-07-12 ENCOUNTER — Encounter: Payer: Self-pay | Admitting: Nurse Practitioner

## 2017-07-12 ENCOUNTER — Ambulatory Visit: Payer: Medicare Other | Attending: Nurse Practitioner | Admitting: Nurse Practitioner

## 2017-07-12 ENCOUNTER — Other Ambulatory Visit: Payer: Self-pay

## 2017-07-12 VITALS — BP 137/76 | HR 74 | Temp 98.2°F | Resp 16 | Ht 65.0 in | Wt 152.0 lb

## 2017-07-12 DIAGNOSIS — Z79891 Long term (current) use of opiate analgesic: Secondary | ICD-10-CM

## 2017-07-12 DIAGNOSIS — M5412 Radiculopathy, cervical region: Secondary | ICD-10-CM | POA: Diagnosis not present

## 2017-07-12 DIAGNOSIS — Z9689 Presence of other specified functional implants: Secondary | ICD-10-CM | POA: Insufficient documentation

## 2017-07-12 DIAGNOSIS — I10 Essential (primary) hypertension: Secondary | ICD-10-CM | POA: Insufficient documentation

## 2017-07-12 DIAGNOSIS — R32 Unspecified urinary incontinence: Secondary | ICD-10-CM | POA: Diagnosis not present

## 2017-07-12 DIAGNOSIS — D125 Benign neoplasm of sigmoid colon: Secondary | ICD-10-CM | POA: Insufficient documentation

## 2017-07-12 DIAGNOSIS — G8929 Other chronic pain: Secondary | ICD-10-CM | POA: Diagnosis not present

## 2017-07-12 DIAGNOSIS — M25511 Pain in right shoulder: Secondary | ICD-10-CM | POA: Insufficient documentation

## 2017-07-12 DIAGNOSIS — Z86010 Personal history of colonic polyps: Secondary | ICD-10-CM | POA: Insufficient documentation

## 2017-07-12 DIAGNOSIS — M150 Primary generalized (osteo)arthritis: Secondary | ICD-10-CM

## 2017-07-12 DIAGNOSIS — M159 Polyosteoarthritis, unspecified: Secondary | ICD-10-CM

## 2017-07-12 DIAGNOSIS — M25512 Pain in left shoulder: Secondary | ICD-10-CM | POA: Diagnosis not present

## 2017-07-12 DIAGNOSIS — Z79899 Other long term (current) drug therapy: Secondary | ICD-10-CM | POA: Diagnosis not present

## 2017-07-12 DIAGNOSIS — E559 Vitamin D deficiency, unspecified: Secondary | ICD-10-CM | POA: Insufficient documentation

## 2017-07-12 DIAGNOSIS — M4722 Other spondylosis with radiculopathy, cervical region: Secondary | ICD-10-CM | POA: Diagnosis not present

## 2017-07-12 DIAGNOSIS — G894 Chronic pain syndrome: Secondary | ICD-10-CM | POA: Diagnosis not present

## 2017-07-12 DIAGNOSIS — F418 Other specified anxiety disorders: Secondary | ICD-10-CM | POA: Insufficient documentation

## 2017-07-12 DIAGNOSIS — K219 Gastro-esophageal reflux disease without esophagitis: Secondary | ICD-10-CM | POA: Diagnosis not present

## 2017-07-12 DIAGNOSIS — M1990 Unspecified osteoarthritis, unspecified site: Secondary | ICD-10-CM | POA: Diagnosis not present
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
## 2017-07-12 NOTE — Patient Instructions (Signed)
____________________________________________________________________________________________  Medication Rules  Applies to: All patients receiving prescriptions (written or electronic).  Pharmacy of record: Pharmacy where electronic prescriptions will be sent. If written prescriptions are taken to a different pharmacy, please inform the nursing staff. The pharmacy listed in the electronic medical record should be the one where you would like electronic prescriptions to be sent.  Prescription refills: Only during scheduled appointments. Applies to both, written and electronic prescriptions.  NOTE: The following applies primarily to controlled substances (Opioid* Pain Medications).   Patient's responsibilities: 1. Pain Pills: Bring all pain pills to every appointment (except for procedure appointments). 2. Pill Bottles: Bring pills in original pharmacy bottle. Always bring newest bottle. Bring bottle, even if empty. 3. Medication refills: You are responsible for knowing and keeping track of what medications you need refilled. The day before your appointment, write a list of all prescriptions that need to be refilled. Bring that list to your appointment and give it to the admitting nurse. Prescriptions will be written only during appointments. If you forget a medication, it will not be "Called in", "Faxed", or "electronically sent". You will need to get another appointment to get these prescribed. 4. Prescription Accuracy: You are responsible for carefully inspecting your prescriptions before leaving our office. Have the discharge nurse carefully go over each prescription with you, before taking them home. Make sure that your name is accurately spelled, that your address is correct. Check the name and dose of your medication to make sure it is accurate. Check the number of pills, and the written instructions to make sure they are clear and accurate. Make sure that you are given enough medication to last  until your next medication refill appointment. 5. Taking Medication: Take medication as prescribed. Never take more pills than instructed. Never take medication more frequently than prescribed. Taking less pills or less frequently is permitted and encouraged, when it comes to controlled substances (written prescriptions).  6. Inform other Doctors: Always inform, all of your healthcare providers, of all the medications you take. 7. Pain Medication from other Providers: You are not allowed to accept any additional pain medication from any other Doctor or Healthcare provider. There are two exceptions to this rule. (see below) In the event that you require additional pain medication, you are responsible for notifying us, as stated below. 8. Medication Agreement: You are responsible for carefully reading and following our Medication Agreement. This must be signed before receiving any prescriptions from our practice. Safely store a copy of your signed Agreement. Violations to the Agreement will result in no further prescriptions. (Additional copies of our Medication Agreement are available upon request.) 9. Laws, Rules, & Regulations: All patients are expected to follow all Federal and State Laws, Statutes, Rules, & Regulations. Ignorance of the Laws does not constitute a valid excuse. The use of any illegal substances is prohibited. 10. Adopted CDC guidelines & recommendations: Target dosing levels will be at or below 60 MME/day. Use of benzodiazepines** is not recommended.  Exceptions: There are only two exceptions to the rule of not receiving pain medications from other Healthcare Providers. 1. Exception #1 (Emergencies): In the event of an emergency (i.e.: accident requiring emergency care), you are allowed to receive additional pain medication. However, you are responsible for: As soon as you are able, call our office [phone], at any time of the day or night, and leave a message stating your name, the  date and nature of the emergency, and the name and dose of the medication   prescribed. In the event that your call is answered by a member of our staff, make sure to document and save the date, time, and the name of the person that took your information.  2. Exception #2 (Planned Surgery): In the event that you are scheduled by another doctor or dentist to have any type of surgery or procedure, you are allowed (for a period no longer than 30 days), to receive additional pain medication, for the acute post-op pain. However, in this case, you are responsible for picking up a copy of our "Post-op Pain Management for Surgeons" handout, and giving it to your surgeon or dentist. This document is available at our office, and does not require an appointment to obtain it. Simply go to our office during business hours (Monday-Thursday from 8:00 AM to 4:00 PM) (Friday 8:00 AM to 12:00 Noon) or if you have a scheduled appointment with us, prior to your surgery, and ask for it by name. In addition, you will need to provide us with your name, name of your surgeon, type of surgery, and date of procedure or surgery.  *Opioid medications include: morphine, codeine, oxycodone, oxymorphone, hydrocodone, hydromorphone, meperidine, tramadol, tapentadol, buprenorphine, fentanyl, methadone. **Benzodiazepine medications include: diazepam (Valium), alprazolam (Xanax), clonazepam (Klonopine), lorazepam (Ativan), clorazepate (Tranxene), chlordiazepoxide (Librium), estazolam (Prosom), oxazepam (Serax), temazepam (Restoril), triazolam (Halcion) (Last updated: 04/07/2017) ____________________________________________________________________________________________    

## 2017-07-12 NOTE — Progress Notes (Signed)
Nursing Pain Medication Assessment:  Safety precautions to be maintained throughout the outpatient stay will include: orient to surroundings, keep bed in low position, maintain call bell within reach at all times, provide assistance with transfer out of bed and ambulation.  Medication Inspection Compliance: Pill count conducted under aseptic conditions, in front of the patient. Neither the pills nor the bottle was removed from the patient's sight at any time. Once count was completed pills were immediately returned to the patient in their original bottle.  Medication: Tramadol (Ultram) Pill/Patch Count: 82 of 90 pills remain Pill/Patch Appearance: Markings consistent with prescribed medication Bottle Appearance: Standard pharmacy container. Clearly labeled. Filled Date: 1 / 17 / 2019 Last Medication intake:  Yesterday

## 2017-07-14 ENCOUNTER — Ambulatory Visit: Payer: Medicare Other | Attending: Urology | Admitting: Physical Therapy

## 2017-07-14 DIAGNOSIS — R279 Unspecified lack of coordination: Secondary | ICD-10-CM | POA: Insufficient documentation

## 2017-07-14 DIAGNOSIS — M6281 Muscle weakness (generalized): Secondary | ICD-10-CM | POA: Insufficient documentation
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
## 2017-07-16 LAB — TOXASSURE SELECT 13 (MW), URINE

## 2017-07-21 ENCOUNTER — Ambulatory Visit: Payer: Medicare Other | Admitting: Physical Therapy

## 2017-07-21 DIAGNOSIS — M6281 Muscle weakness (generalized): Secondary | ICD-10-CM | POA: Diagnosis not present

## 2017-07-21 DIAGNOSIS — R279 Unspecified lack of coordination: Secondary | ICD-10-CM

## 2017-07-22 NOTE — Patient Instructions (Signed)
Pelvic floor stretches:   Knee to chest, Cross thigh over opposite thigh,  Happy baby pose with towel behind thighs   Relaxation with music( youtube links)
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
## 2017-07-28 ENCOUNTER — Ambulatory Visit: Payer: Medicare Other | Admitting: Physical Therapy

## 2017-07-28 DIAGNOSIS — M6281 Muscle weakness (generalized): Secondary | ICD-10-CM

## 2017-07-28 DIAGNOSIS — R279 Unspecified lack of coordination: Secondary | ICD-10-CM
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
## 2017-07-29 NOTE — Patient Instructions (Signed)
Figure-4 and thigh crossed with towel under thighs , laying on back  5 breaths    Happy baby pose ( do not push leg straight) keep feet facing ceiling, knees towards armpits.

## 2017-08-04 ENCOUNTER — Other Ambulatory Visit: Payer: Self-pay

## 2017-08-04 ENCOUNTER — Ambulatory Visit: Payer: Medicare Other | Admitting: Physical Therapy

## 2017-08-04 ENCOUNTER — Ambulatory Visit (HOSPITAL_BASED_OUTPATIENT_CLINIC_OR_DEPARTMENT_OTHER): Payer: Medicare Other | Admitting: Pain Medicine

## 2017-08-04 ENCOUNTER — Ambulatory Visit
Admission: RE | Admit: 2017-08-04 | Discharge: 2017-08-04 | Disposition: A | Discharge status: Home / Self Care | Payer: Medicare Other | Source: Ambulatory Visit | Attending: Pain Medicine | Admitting: Pain Medicine

## 2017-08-04 ENCOUNTER — Encounter: Payer: Self-pay | Admitting: Pain Medicine

## 2017-08-04 VITALS — BP 137/66 | HR 71 | Temp 98.5°F | Resp 16 | Ht 65.0 in | Wt 153.0 lb

## 2017-08-04 DIAGNOSIS — M79601 Pain in right arm: Secondary | ICD-10-CM

## 2017-08-04 DIAGNOSIS — M5030 Other cervical disc degeneration, unspecified cervical region: Secondary | ICD-10-CM | POA: Insufficient documentation

## 2017-08-04 DIAGNOSIS — Z885 Allergy status to narcotic agent status: Secondary | ICD-10-CM | POA: Insufficient documentation

## 2017-08-04 DIAGNOSIS — M5412 Radiculopathy, cervical region: Secondary | ICD-10-CM

## 2017-08-04 DIAGNOSIS — Z9889 Other specified postprocedural states: Secondary | ICD-10-CM | POA: Diagnosis not present

## 2017-08-04 DIAGNOSIS — Z888 Allergy status to other drugs, medicaments and biological substances status: Secondary | ICD-10-CM | POA: Insufficient documentation

## 2017-08-04 DIAGNOSIS — G8929 Other chronic pain: Secondary | ICD-10-CM | POA: Insufficient documentation

## 2017-08-04 DIAGNOSIS — M542 Cervicalgia: Secondary | ICD-10-CM

## 2017-08-04 DIAGNOSIS — Z9049 Acquired absence of other specified parts of digestive tract: Secondary | ICD-10-CM | POA: Diagnosis not present

## 2017-08-04 DIAGNOSIS — M4722 Other spondylosis with radiculopathy, cervical region: Secondary | ICD-10-CM | POA: Insufficient documentation

## 2017-08-04 DIAGNOSIS — M159 Polyosteoarthritis, unspecified: Secondary | ICD-10-CM

## 2017-08-04 DIAGNOSIS — M150 Primary generalized (osteo)arthritis: Secondary | ICD-10-CM

## 2017-08-04 DIAGNOSIS — M79602 Pain in left arm: Secondary | ICD-10-CM

## 2017-08-04 DIAGNOSIS — Z9689 Presence of other specified functional implants: Secondary | ICD-10-CM | POA: Insufficient documentation

## 2017-08-04 DIAGNOSIS — G894 Chronic pain syndrome: Secondary | ICD-10-CM

## 2017-08-04 MED ORDER — FENTANYL CITRATE (PF) 100 MCG/2ML IJ SOLN: 25.0000 ug | INTRAMUSCULAR | Status: DC | PRN

## 2017-08-04 MED ORDER — LIDOCAINE HCL 2 % IJ SOLN
20.0000 mL | Freq: Once | INTRAMUSCULAR | Status: AC
  Administered 2017-08-04: 400 mg
  Filled 2017-08-04: qty 20

## 2017-08-04 MED ORDER — SODIUM CHLORIDE 0.9% FLUSH
1.0000 mL | Freq: Once | INTRAVENOUS | Status: AC
  Administered 2017-08-04: 10 mL

## 2017-08-04 MED ORDER — TRAMADOL HCL 50 MG PO TABS: 50.0000 mg | ORAL_TABLET | Freq: Four times a day (QID) | ORAL | 0 refills | Status: DC | PRN

## 2017-08-04 MED ORDER — DEXAMETHASONE SODIUM PHOSPHATE 10 MG/ML IJ SOLN
10.0000 mg | Freq: Once | INTRAMUSCULAR | Status: AC
  Administered 2017-08-04: 10 mg
  Filled 2017-08-04: qty 1

## 2017-08-04 MED ORDER — ROPIVACAINE HCL 2 MG/ML IJ SOLN
1.0000 mL | Freq: Once | INTRAMUSCULAR | Status: AC
  Administered 2017-08-04: 10 mL via EPIDURAL
  Filled 2017-08-04: qty 10

## 2017-08-04 MED ORDER — IOPAMIDOL (ISOVUE-M 200) INJECTION 41%
10.0000 mL | Freq: Once | INTRAMUSCULAR | Status: AC
  Administered 2017-08-04: 10 mL via EPIDURAL
  Filled 2017-08-04: qty 10

## 2017-08-04 MED ORDER — MELOXICAM 15 MG PO TABS: 15.0000 mg | ORAL_TABLET | Freq: Every day | ORAL | 5 refills | Status: DC

## 2017-08-04 MED ORDER — LACTATED RINGERS IV SOLN: 1000.0000 mL | Freq: Once | INTRAVENOUS | Status: DC

## 2017-08-04 MED ORDER — MIDAZOLAM HCL 5 MG/5ML IJ SOLN: 1.0000 mg | INTRAMUSCULAR | Status: DC | PRN

## 2017-08-04 NOTE — Patient Instructions (Addendum)

## 2017-08-04 NOTE — Progress Notes (Signed)
Patient's Name: Alyssa Wood  MRN: [id number]  Referring Provider: [facility], [facility]  DOB: 11/14/1960  PCP: [facility], [place]  DOS: 08/04/2017  Note by: Gaspar Cola, MD  Service setting: Ambulatory outpatient  Specialty: Interventional Pain Management  Patient type: Established  Location: [facility] (AMB) Pain Management Facility  Visit type: Interventional Procedure   Primary Reason for Visit: Interventional Pain Management Treatment. CC: Neck Pain (down the right arm)  Procedure:          Anesthesia, Analgesia, Anxiolysis:  Type: Diagnostic, Inter-Laminar, Epidural Steroid Injection #2  Region: Posterior Cervico-thoracic Region Level: C7-T1 Laterality: Right-Sided Paramedial  Type: Local Anesthesia Indication(s): Analgesia         Route: Infiltration ([place]/IM) IV Access: Declined Sedation: Declined  Local Anesthetic: Lidocaine 1-2%   Indications: 1. DDD (degenerative disc disease), cervical   2. Cervical spondylosis with radiculopathy (Bilateral)   3. Chronic cervical radiculitis (Bilateral) (R>L)   4. Chronic neck pain (Primary Area of Pain) (Bilateral) (R>L)   5. Chronic shoulder radicular pain (Bilateral)   6. Chronic upper extremity pain (Bilateral) (R>L)    Pain Score: Pre-procedure: 9 /10 Post-procedure: 0-No pain/10  Pre-op Assessment:  Alyssa Wood is a 57 y.o. (year old), female patient, seen today for interventional treatment. She  has a past surgical history that includes Cholecystectomy (1992); Spinal cord stimulator implant (Right, Jan 2013); Shoulder surgery (Right); Wrist surgery (Right); Colonoscopy with propofol (N/A, 08/28/2015); and polypectomy (08/28/2015). Ms. Million has a current medication list which includes the following prescription(s): calcium-vitamin d, cyanocobalamin, cyclobenzaprine, docusate sodium, lisinopril-hydrochlorothiazide, omeprazole, oxybutynin, sertraline, meloxicam, and tramadol. Her  primarily concern today is the Neck Pain (down the right arm)  Initial Vital Signs:  Pulse/HCG Rate: 71ECG Heart Rate: 78 Temp: 98.5 F (36.9 C) Resp: 16 BP: 140/77 SpO2: 98 %  BMI: Estimated body mass index is 25.46 kg/m as calculated from the following:   Height as of this encounter: 5\' 5"  (1.651 m).   Weight as of this encounter: 153 lb (69.4 kg).  Risk Assessment: Allergies: Reviewed. She is allergic to trazodone and nefazodone and hydrocodone.  Allergy Precautions: None required Coagulopathies: Reviewed. None identified.  Blood-thinner therapy: None at this time Active Infection(s): Reviewed. None identified. Alyssa Wood is afebrile  Site Confirmation: Alyssa Wood was asked to confirm the procedure and laterality before marking the site Procedure checklist: Completed Consent: Before the procedure and under the influence of no sedative(s), amnesic(s), or anxiolytics, the patient was informed of the treatment options, risks and possible complications. To fulfill our ethical and legal obligations, as recommended by the American Medical Association's Code of Ethics, I have informed the patient of my clinical impression; the nature and purpose of the treatment or procedure; the risks, benefits, and possible complications of the intervention; the alternatives, including doing nothing; the risk(s) and benefit(s) of the alternative treatment(s) or procedure(s); and the risk(s) and benefit(s) of doing nothing. The patient was provided information about the general risks and possible complications associated with the procedure. These may include, but are not limited to: failure to achieve desired goals, infection, bleeding, organ or nerve damage, allergic reactions, paralysis, and death. In addition, the patient was informed of those risks and complications associated to Spine-related procedures, such as failure to decrease pain; infection (i.e.: Meningitis, epidural or  intraspinal abscess); bleeding (i.e.: epidural hematoma, subarachnoid hemorrhage, or any other type of intraspinal or peri-dural bleeding); organ or nerve damage (i.e.: Any type of peripheral nerve, nerve root, or spinal cord  injury) with subsequent damage to sensory, motor, and/or autonomic systems, resulting in permanent pain, numbness, and/or weakness of one or several areas of the body; allergic reactions; (i.e.: anaphylactic reaction); and/or death. Furthermore, the patient was informed of those risks and complications associated with the medications. These include, but are not limited to: allergic reactions (i.e.: anaphylactic or anaphylactoid reaction(s)); adrenal axis suppression; blood sugar elevation that in diabetics may result in ketoacidosis or comma; water retention that in patients with history of congestive heart failure may result in shortness of breath, pulmonary edema, and decompensation with resultant heart failure; weight gain; swelling or edema; medication-induced neural toxicity; particulate matter embolism and blood vessel occlusion with resultant organ, and/or nervous system infarction; and/or aseptic necrosis of one or more joints. Finally, the patient was informed that Medicine is not an exact science; therefore, there is also the possibility of unforeseen or unpredictable risks and/or possible complications that may result in a catastrophic outcome. The patient indicated having understood very clearly. We have given the patient no guarantees and we have made no promises. Enough time was given to the patient to ask questions, all of which were answered to the patient's satisfaction. Ms. Liscano has indicated that she wanted to continue with the procedure. Attestation: I, the ordering provider, attest that I have discussed with the patient the benefits, risks, side-effects, alternatives, likelihood of achieving goals, and potential problems during recovery for the procedure  that I have provided informed consent. Date  Time: 08/04/2017  7:59 AM  Pre-Procedure Preparation:  Monitoring: As per clinic protocol. Respiration, ETCO2, SpO2, BP, heart rate and rhythm monitor placed and checked for adequate function Safety Precautions: Patient was assessed for positional comfort and pressure points before starting the procedure. Time-out: I initiated and conducted the "Time-out" before starting the procedure, as per protocol. The patient was asked to participate by confirming the accuracy of the "Time Out" information. Verification of the correct person, site, and procedure were performed and confirmed by me, the nursing staff, and the patient. "Time-out" conducted as per Joint Commission's Universal Protocol (UP.01.01.01). Time: 0845  Description of Procedure:          Position: Prone with head of the table was raised to facilitate breathing. Target Area: For Epidural Steroid injections the target is the interlaminar space, initially targeting the lower border of the superior vertebral body lamina. Approach: Paramedial approach. Area Prepped: Entire PosteriorCervical Region Prepping solution: ChloraPrep (2% chlorhexidine gluconate and 70% isopropyl alcohol) Safety Precautions: Aspiration looking for blood return was conducted prior to all injections. At no point did we inject any substances, as a needle was being advanced. No attempts were made at seeking any paresthesias. Safe injection practices and needle disposal techniques used. Medications properly checked for expiration dates. SDV (single dose vial) medications used. Description of the Procedure: Protocol guidelines were followed. The procedure needle was introduced through the skin, ipsilateral to the reported pain, and advanced to the target area. Bone was contacted and the needle walked caudad, until the lamina was cleared. The epidural space was identified using "loss-of-resistance technique" with 2-3 ml of PF-NaCl  (0.9% NSS), in a 5cc LOR glass syringe. Vitals:   08/04/17 0848 08/04/17 0853 08/04/17 0859 08/04/17 0906  BP: (!) 146/74 (!) 163/86 (!) 155/82 137/66  Pulse:      Resp: 14 16 14 16   Temp:      TempSrc:      SpO2: 97% 96% 95% 97%  Weight:      Height:  Start Time: 0845 hrs. End Time: 0854 hrs. Materials:  Needle(s) Type: Epidural needle Gauge: 17G Length: 3.5-in Medication(s): Please see orders for medications and dosing details.  Imaging Guidance (Spinal):          Type of Imaging Technique: Fluoroscopy Guidance (Spinal) Indication(s): Assistance in needle guidance and placement for procedures requiring needle placement in or near specific anatomical locations not easily accessible without such assistance. Exposure Time: Please see nurses notes. Contrast: Before injecting any contrast, we confirmed that the patient did not have an allergy to iodine, shellfish, or radiological contrast. Once satisfactory needle placement was completed at the desired level, radiological contrast was injected. Contrast injected under live fluoroscopy. No contrast complications. See chart for type and volume of contrast used. Fluoroscopic Guidance: I was personally present during the use of fluoroscopy. "Tunnel Vision Technique" used to obtain the best possible view of the target area. Parallax error corrected before commencing the procedure. "Direction-depth-direction" technique used to introduce the needle under continuous pulsed fluoroscopy. Once target was reached, antero-posterior, oblique, and lateral fluoroscopic projection used confirm needle placement in all planes. Images permanently stored in EMR. Interpretation: I personally interpreted the imaging intraoperatively. Adequate needle placement confirmed in multiple planes. Appropriate spread of contrast into desired area was observed. No evidence of afferent or efferent intravascular uptake. No intrathecal or subarachnoid spread observed.  Permanent images saved into the patient's record.  Antibiotic Prophylaxis:   Anti-infectives (From admission, onward)   None     Indication(s): None identified  Post-operative Assessment:  Post-procedure Vital Signs:  Pulse/HCG Rate: 7179 Temp: 98.5 F (36.9 C) Resp: 16 BP: 137/66 SpO2: 97 %  EBL: None  Complications: No immediate post-treatment complications observed by team, or reported by patient.  Note: The patient tolerated the entire procedure well. A repeat set of vitals were taken after the procedure and the patient was kept under observation following institutional policy, for this type of procedure. Post-procedural neurological assessment was performed, showing return to baseline, prior to discharge. The patient was provided with post-procedure discharge instructions, including a section on how to identify potential problems. Should any problems arise concerning this procedure, the patient was given instructions to immediately contact us, at any time, without hesitation. In any case, we plan to contact the patient by telephone for a follow-up status report regarding this interventional procedure.  Comments:  No additional relevant information.  Plan of Care    Imaging Orders     DG C-Arm 1-60 Min-No Report  Procedure Orders     Cervical Epidural Injection  Medications ordered for procedure: Meds ordered this encounter  Medications  . iopamidol (ISOVUE-M) 41 % intrathecal injection 10 mL    Must be Myelogram-compatible. If not available, you may substitute with a water-soluble, non-ionic, hypoallergenic, myelogram-compatible radiological contrast medium.  [name] lidocaine (XYLOCAINE) 2 % (with pres) injection 400 mg  . DISCONTD: midazolam (VERSED) 5 MG/5ML injection 1-2 mg    Make sure Flumazenil is available in the pyxis when using this medication. If oversedation occurs, administer 0.2 mg IV over 15 sec. If after 45 sec no response, administer 0.2 mg again over 1 min;  may repeat at 1 min intervals; not to exceed 4 doses (1 mg)  . DISCONTD: fentaNYL (SUBLIMAZE) injection 25-50 mcg    Make sure Narcan is available in the pyxis when using this medication. In the event of respiratory depression (RR< 8/min): Titrate NARCAN (naloxone) in increments of 0.1 to 0.2 mg IV at 2-3 minute intervals, until desired degree  of reversal.  . DISCONTD: lactated ringers infusion 1,000 mL  . sodium chloride flush (NS) 0.9 % injection 1 mL  . ropivacaine (PF) 2 mg/mL (0.2%) (NAROPIN) injection 1 mL  . dexamethasone (DECADRON) injection 10 mg  . meloxicam (MOBIC) 15 MG tablet    Sig: Take 1 tablet (15 mg total) by mouth daily.    Dispense:  30 tablet    Refill:  5    Do not add this medication to the electronic "Automatic Refill" notification system. Patient may have prescription filled one day early if pharmacy is closed on scheduled refill date.  . traMADol (ULTRAM) 50 MG tablet    Sig: Take 1 tablet (50 mg total) by mouth every 6 (six) hours as needed for severe pain.    Dispense:  90 tablet    Refill:  0    Do not place this medication, or any other prescription from our practice, on "Automatic Refill". Patient may have prescription filled one day early if pharmacy is closed on scheduled refill date. Do not fill until: 08/04/17 To last until: 09/03/17   Medications administered: We administered iopamidol, lidocaine, sodium chloride flush, ropivacaine (PF) 2 mg/mL (0.2%), and dexamethasone.  See the medical record for exact dosing, route, and time of administration.  New Prescriptions   No medications on file   Disposition: Discharge home  Discharge Date & Time: 08/04/2017; 0915 hrs.   Physician-requested Follow-up: Return for post-procedure eval (2 wks), w/ Dionisio David, NP.  Future Appointments  Date Time Provider [place]  08/17/2017  9:15 AM Vevelyn Francois, NP [facility] None  08/18/2017 10:00 AM Jerl Mina, PT [facility] None  08/25/2017 10:00 AM  Jerl Mina, PT [facility] None  09/05/2017 10:45 AM [facility] None  10/12/2017 10:30 AM Vevelyn Francois, NP [facility] None   Primary Care Physician: [facility] [place] Location: [facility] Outpatient Pain Management Facility Note by: Gaspar Cola, MD Date: 08/04/2017; Time: 10:55 AM  Disclaimer:  Medicine is not an exact science. The only guarantee in medicine is that nothing is guaranteed. It is important to note that the decision to proceed with this intervention was based on the information collected from the patient. The Data and conclusions were drawn from the patient's questionnaire, the interview, and the physical examination. Because the information was provided in large part by the patient, it cannot be guaranteed that it has not been purposely or unconsciously manipulated. Every effort has been made to obtain as much relevant data as possible for this evaluation. It is important to note that the conclusions that lead to this procedure are derived in large part from the available data. Always take into account that the treatment will also be dependent on availability of resources and existing treatment guidelines, considered by other Pain Management Practitioners as being common knowledge and practice, at the time of the intervention. For Medico-Legal purposes, it is also important to point out that variation in procedural techniques and pharmacological choices are the acceptable norm. The indications, contraindications, technique, and results of the above procedure should only be interpreted and judged by a Board-Certified Interventional Pain Specialist with extensive familiarity and expertise in the same exact procedure and technique.

## 2017-08-04 NOTE — Progress Notes (Signed)
Safety precautions to be maintained throughout the outpatient stay will include: orient to surroundings, keep bed in low position, maintain call bell within reach at all times, provide assistance with transfer out of bed and ambulation.  5056 After completion of the procedure, patient states she feels weak. VS rechecked. Dr. Dossie Arbour notified. 9794 States feeling better. Transferred by wheelchair to recovery room to await driver to arrive.

## 2017-08-05 ENCOUNTER — Telehealth: Payer: Self-pay

## 2017-08-05 ENCOUNTER — Other Ambulatory Visit: Payer: Self-pay

## 2017-08-05 MED ORDER — ONDANSETRON HCL 4 MG PO TABS: 4.0000 mg | ORAL_TABLET | Freq: Three times a day (TID) | ORAL | 0 refills | Status: DC | PRN

## 2017-08-05 NOTE — Telephone Encounter (Signed)
Post procedure phone call.  Patient states she had some vomiting.  States she did this a little last time.  She denies fever.  Instructed patient to get up, try to eat something and I would check back with her before leaving office.

## 2017-08-05 NOTE — Telephone Encounter (Signed)
Returned patient post procedure phone call via interpreter.    Patient still complains of nausea, vomiting and weakness.  States she didn't sleep last night which she thinks is part of her feeling weakness.  Complains of headache which has not been relieved with Advil.  States that headache does not go away when lying down.  States when lying down she can feel her head throbbing.  States she has gotten nauseated in the past after procedures, but it usually goes away.  Started vomiting last night around 7pm.  Has been unable to keep anything down.  Denies fever or chills.  Will call Dr Dossie Arbour and notify him.  Spoke with Dr Dossie Arbour.  Verbal order to call in Zofran 4 mg po or suppository Every 6 hours prn not to exceed 3 per day.

## 2017-08-05 NOTE — Telephone Encounter (Signed)
Spoke with patient via interpreter.  Informed patient that script for Zofran 4 mg po was sent to her pharmacy.  Informed patient to go to ED if continues to have vomiting.

## 2017-08-17 ENCOUNTER — Other Ambulatory Visit: Payer: Self-pay

## 2017-08-17 ENCOUNTER — Encounter: Payer: Self-pay | Admitting: Nurse Practitioner

## 2017-08-17 ENCOUNTER — Ambulatory Visit: Payer: Medicare Other | Attending: Nurse Practitioner | Admitting: Nurse Practitioner

## 2017-08-17 VITALS — BP 136/74 | HR 86 | Temp 98.6°F | Resp 16 | Ht 65.0 in | Wt 154.0 lb

## 2017-08-17 DIAGNOSIS — F418 Other specified anxiety disorders: Secondary | ICD-10-CM | POA: Diagnosis not present

## 2017-08-17 DIAGNOSIS — Z79899 Other long term (current) drug therapy: Secondary | ICD-10-CM | POA: Insufficient documentation

## 2017-08-17 DIAGNOSIS — D125 Benign neoplasm of sigmoid colon: Secondary | ICD-10-CM | POA: Insufficient documentation

## 2017-08-17 DIAGNOSIS — Z9889 Other specified postprocedural states: Secondary | ICD-10-CM | POA: Diagnosis not present

## 2017-08-17 DIAGNOSIS — Z79891 Long term (current) use of opiate analgesic: Secondary | ICD-10-CM | POA: Insufficient documentation

## 2017-08-17 DIAGNOSIS — Z9049 Acquired absence of other specified parts of digestive tract: Secondary | ICD-10-CM | POA: Diagnosis not present

## 2017-08-17 DIAGNOSIS — Z791 Long term (current) use of non-steroidal anti-inflammatories (NSAID): Secondary | ICD-10-CM | POA: Diagnosis not present

## 2017-08-17 DIAGNOSIS — Z809 Family history of malignant neoplasm, unspecified: Secondary | ICD-10-CM | POA: Insufficient documentation

## 2017-08-17 DIAGNOSIS — Z86010 Personal history of colonic polyps: Secondary | ICD-10-CM | POA: Diagnosis not present

## 2017-08-17 DIAGNOSIS — Z8249 Family history of ischemic heart disease and other diseases of the circulatory system: Secondary | ICD-10-CM | POA: Insufficient documentation

## 2017-08-17 DIAGNOSIS — G894 Chronic pain syndrome: Secondary | ICD-10-CM

## 2017-08-17 DIAGNOSIS — K219 Gastro-esophageal reflux disease without esophagitis: Secondary | ICD-10-CM | POA: Insufficient documentation

## 2017-08-17 DIAGNOSIS — M25511 Pain in right shoulder: Secondary | ICD-10-CM | POA: Diagnosis present

## 2017-08-17 DIAGNOSIS — M4722 Other spondylosis with radiculopathy, cervical region: Secondary | ICD-10-CM

## 2017-08-17 DIAGNOSIS — Z888 Allergy status to other drugs, medicaments and biological substances status: Secondary | ICD-10-CM | POA: Insufficient documentation

## 2017-08-17 DIAGNOSIS — G8929 Other chronic pain: Secondary | ICD-10-CM

## 2017-08-17 DIAGNOSIS — M5412 Radiculopathy, cervical region: Secondary | ICD-10-CM

## 2017-08-17 DIAGNOSIS — M79601 Pain in right arm: Secondary | ICD-10-CM

## 2017-08-17 DIAGNOSIS — I10 Essential (primary) hypertension: Secondary | ICD-10-CM | POA: Insufficient documentation

## 2017-08-17 DIAGNOSIS — Z885 Allergy status to narcotic agent status: Secondary | ICD-10-CM | POA: Diagnosis not present

## 2017-08-17 DIAGNOSIS — R32 Unspecified urinary incontinence: Secondary | ICD-10-CM | POA: Diagnosis not present

## 2017-08-17 DIAGNOSIS — E559 Vitamin D deficiency, unspecified: Secondary | ICD-10-CM | POA: Insufficient documentation

## 2017-08-17 DIAGNOSIS — M79602 Pain in left arm: Secondary | ICD-10-CM

## 2017-08-17 MED ORDER — TRAMADOL HCL 50 MG PO TABS: 50.0000 mg | ORAL_TABLET | Freq: Four times a day (QID) | ORAL | 0 refills | Status: DC | PRN

## 2017-08-17 NOTE — Progress Notes (Signed)
Nursing Pain Medication Assessment:  Safety precautions to be maintained throughout the outpatient stay will include: orient to surroundings, keep bed in low position, maintain call bell within reach at all times, provide assistance with transfer out of bed and ambulation.  Medication Inspection Compliance: Pill count conducted under aseptic conditions, in front of the patient. Neither the pills nor the bottle was removed from the patient's sight at any time. Once count was completed pills were immediately returned to the patient in their original bottle.  Medication: Tramadol (Ultram) Pill/Patch Count: 57 of 90 pills remain Pill/Patch Appearance: Markings consistent with prescribed medication Bottle Appearance: Standard pharmacy container. Clearly labeled. Filled Date: 1 / 17 / 2019 Last Medication intake:  Yesterday

## 2017-08-17 NOTE — Progress Notes (Signed)
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 Laws, Rules, & Regulations: All patients are expected to follow all Federal and [employer], [employer], Rules, [employer]. Ignorance of the Laws does not constitute a valid excuse. The use of any illegal substances is prohibited. 10. Adopted CDC guidelines & recommendations: Target dosing levels will be at or below 60 MME/day. Use of benzodiazepines** is not recommended.  Exceptions: There are only two exceptions to the rule of not receiving pain medications from other Healthcare Providers. 1. Exception #1 (Emergencies): In the event of an emergency (i.e.: accident requiring emergency care), you are allowed to receive additional pain medication. However, you are responsible for: As soon as you  are able, call our office [phone], at any time of the day or night, and leave a message stating your name, the date and nature of the emergency, and the name and dose of the medication prescribed. In the event that your call is answered by a member of our staff, make sure to document and save the date, time, and the name of the person that took your information.  2. Exception #2 (Planned Surgery): In the event that you are scheduled by another doctor or dentist to have any type of surgery or procedure, you are allowed (for a period no longer than 30 days), to receive additional pain medication, for the acute post-op pain. However, in this case, you are responsible for picking up a copy of our "Post-op Pain Management for Surgeons" handout, and giving it to your surgeon or dentist. This document is available at our office, and does not require an appointment to obtain it. Simply go to our office during business hours (Monday-Thursday from 8:00 AM to 4:00 PM) (Friday 8:00 AM to 12:00 Noon) or if you have a scheduled appointment with [place], prior to your surgery, and ask for it by name. In addition, you will need to provide [place] with your name, name of your surgeon, type of surgery, and date of procedure or surgery.  *Opioid medications include: morphine, codeine, oxycodone, oxymorphone, hydrocodone, hydromorphone, meperidine, tramadol, tapentadol, buprenorphine, fentanyl, methadone. **Benzodiazepine medications include: diazepam (Valium), alprazolam (Xanax), clonazepam (Klonopine), lorazepam (Ativan), clorazepate (Tranxene), chlordiazepoxide (Librium), estazolam (Prosom), oxazepam (Serax), temazepam (Restoril), triazolam (Halcion) (Last updated: 04/07/2017) ____________________________________________________________________________________________

## 2017-08-17 NOTE — Patient Instructions (Signed)
____________________________________________________________________________________________  Medication Rules  Applies to: All patients receiving prescriptions (written or electronic).  Pharmacy of record: Pharmacy where electronic prescriptions will be sent. If written prescriptions are taken to a different pharmacy, please inform the nursing staff. The pharmacy listed in the electronic medical record should be the one where you would like electronic prescriptions to be sent.  Prescription refills: Only during scheduled appointments. Applies to both, written and electronic prescriptions.  NOTE: The following applies primarily to controlled substances (Opioid* Pain Medications).   Patient's responsibilities: 1. Pain Pills: Bring all pain pills to every appointment (except for procedure appointments). 2. Pill Bottles: Bring pills in original pharmacy bottle. Always bring newest bottle. Bring bottle, even if empty. 3. Medication refills: You are responsible for knowing and keeping track of what medications you need refilled. The day before your appointment, write a list of all prescriptions that need to be refilled. Bring that list to your appointment and give it to the admitting nurse. Prescriptions will be written only during appointments. If you forget a medication, it will not be "Called in", "Faxed", or "electronically sent". You will need to get another appointment to get these prescribed. 4. Prescription Accuracy: You are responsible for carefully inspecting your prescriptions before leaving our office. Have the discharge nurse carefully go over each prescription with you, before taking them home. Make sure that your name is accurately spelled, that your address is correct. Check the name and dose of your medication to make sure it is accurate. Check the number of pills, and the written instructions to make sure they are clear and accurate. Make sure that you are given enough medication to last  until your next medication refill appointment. 5. Taking Medication: Take medication as prescribed. Never take more pills than instructed. Never take medication more frequently than prescribed. Taking less pills or less frequently is permitted and encouraged, when it comes to controlled substances (written prescriptions).  6. Inform other Doctors: Always inform, all of your healthcare providers, of all the medications you take. 7. Pain Medication from other Providers: You are not allowed to accept any additional pain medication from any other Doctor or Healthcare provider. There are two exceptions to this rule. (see below) In the event that you require additional pain medication, you are responsible for notifying us, as stated below. 8. Medication Agreement: You are responsible for carefully reading and following our Medication Agreement. This must be signed before receiving any prescriptions from our practice. Safely store a copy of your signed Agreement. Violations to the Agreement will result in no further prescriptions. (Additional copies of our Medication Agreement are available upon request.) 9. Laws, Rules, & Regulations: All patients are expected to follow all Federal and State Laws, Statutes, Rules, & Regulations. Ignorance of the Laws does not constitute a valid excuse. The use of any illegal substances is prohibited. 10. Adopted CDC guidelines & recommendations: Target dosing levels will be at or below 60 MME/day. Use of benzodiazepines** is not recommended.  Exceptions: There are only two exceptions to the rule of not receiving pain medications from other Healthcare Providers. 1. Exception #1 (Emergencies): In the event of an emergency (i.e.: accident requiring emergency care), you are allowed to receive additional pain medication. However, you are responsible for: As soon as you are able, call our office [phone], at any time of the day or night, and leave a message stating your name, the  date and nature of the emergency, and the name and dose of the medication   prescribed. In the event that your call is answered by a member of our staff, make sure to document and save the date, time, and the name of the person that took your information.  2. Exception #2 (Planned Surgery): In the event that you are scheduled by another doctor or dentist to have any type of surgery or procedure, you are allowed (for a period no longer than 30 days), to receive additional pain medication, for the acute post-op pain. However, in this case, you are responsible for picking up a copy of our "Post-op Pain Management for Surgeons" handout, and giving it to your surgeon or dentist. This document is available at our office, and does not require an appointment to obtain it. Simply go to our office during business hours (Monday-Thursday from 8:00 AM to 4:00 PM) (Friday 8:00 AM to 12:00 Noon) or if you have a scheduled appointment with us, prior to your surgery, and ask for it by name. In addition, you will need to provide us with your name, name of your surgeon, type of surgery, and date of procedure or surgery.  *Opioid medications include: morphine, codeine, oxycodone, oxymorphone, hydrocodone, hydromorphone, meperidine, tramadol, tapentadol, buprenorphine, fentanyl, methadone. **Benzodiazepine medications include: diazepam (Valium), alprazolam (Xanax), clonazepam (Klonopine), lorazepam (Ativan), clorazepate (Tranxene), chlordiazepoxide (Librium), estazolam (Prosom), oxazepam (Serax), temazepam (Restoril), triazolam (Halcion) (Last updated: 04/07/2017) ____________________________________________________________________________________________    

## 2017-08-18 ENCOUNTER — Ambulatory Visit: Payer: Medicare Other | Attending: Urology | Admitting: Physical Therapy

## 2017-08-18 DIAGNOSIS — M6281 Muscle weakness (generalized): Secondary | ICD-10-CM | POA: Diagnosis not present

## 2017-08-18 DIAGNOSIS — M542 Cervicalgia: Secondary | ICD-10-CM | POA: Diagnosis present

## 2017-08-18 DIAGNOSIS — M25511 Pain in right shoulder: Secondary | ICD-10-CM | POA: Diagnosis present

## 2017-08-18 DIAGNOSIS — G8929 Other chronic pain: Secondary | ICD-10-CM | POA: Insufficient documentation

## 2017-08-18 DIAGNOSIS — M791 Myalgia, unspecified site: Secondary | ICD-10-CM | POA: Diagnosis not present

## 2017-08-18 DIAGNOSIS — R279 Unspecified lack of coordination: Secondary | ICD-10-CM
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
## 2017-08-19 NOTE — Patient Instructions (Signed)
Pelvic floor squeezes with exhalation , pillow under hips and back      Discontinue drinking grapefruit juice daily  Replace with apple juice without sugars instead   _______  Perform joint ROM at neck, shoulders, hips, feet for relaxation prior to pelvic stretches and pelvic squeezes and deep core level 2  ( handout)

## 2017-08-22 NOTE — Addendum Note (Signed)
Addended by: Jerl Mina on: 08/22/2017 03:30 PM   Modules accepted: Orders

## 2017-08-25 ENCOUNTER — Ambulatory Visit: Payer: Medicare Other | Admitting: Physical Therapy

## 2017-09-02 ENCOUNTER — Ambulatory Visit: Payer: Medicare Other | Admitting: Physical Therapy

## 2017-09-02 DIAGNOSIS — M6281 Muscle weakness (generalized): Secondary | ICD-10-CM

## 2017-09-02 DIAGNOSIS — R279 Unspecified lack of coordination: Secondary | ICD-10-CM

## 2017-09-02 DIAGNOSIS — M25511 Pain in right shoulder: Secondary | ICD-10-CM

## 2017-09-02 DIAGNOSIS — G8929 Other chronic pain: Secondary | ICD-10-CM

## 2017-09-02 DIAGNOSIS — M542 Cervicalgia: Secondary | ICD-10-CM
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
## 2017-09-05 ENCOUNTER — Ambulatory Visit (INDEPENDENT_AMBULATORY_CARE_PROVIDER_SITE_OTHER): Payer: Medicare Other | Admitting: Urology

## 2017-09-05 ENCOUNTER — Encounter: Payer: Self-pay | Admitting: Urology

## 2017-09-05 ENCOUNTER — Other Ambulatory Visit: Payer: Self-pay | Admitting: Family Medicine

## 2017-09-05 VITALS — BP 132/83 | HR 77 | Ht 65.0 in | Wt 157.6 lb

## 2017-09-05 DIAGNOSIS — Z1231 Encounter for screening mammogram for malignant neoplasm of breast: Secondary | ICD-10-CM

## 2017-09-05 DIAGNOSIS — N3946 Mixed incontinence: Secondary | ICD-10-CM

## 2017-09-05 NOTE — Progress Notes (Signed)
09/05/2017 10:47 AM   Alyssa Wood Mar 01, 1960 [id number]  Referring provider: [facility], [place], [place] [address]  Chief Complaint  Patient presents with  . Urinary Incontinence    HPI: Dr Erlene Quan: Patient with mixed incontinence who failed Myrbetriq.  Patient given oxybutynin  Today The patient leaks with coughing sneezing bending and lifting.  She can lift with walking.  She has urge incontinence and both are significant.  She wears 2 pads per day which are damp.  She denies bedwetting.  She failed oxybutynin and the Myrbetriq  She has had back surgery and no hysterectomy  She denies a history of previous GU surgery kidney stones and bladder infections.  Bowel movements normal  Modifying factors: There are no other modifying factors  Associated signs and symptoms: There are no other associated signs and symptoms Aggravating and relieving factors: There are no other aggravating or relieving factors Severity: Moderate Duration: Persistent   PMH: Past Medical History:  Diagnosis Date  . Anxiety   . Chronic pain   . GERD (gastroesophageal reflux disease)   . Hypertension     Surgical History: Past Surgical History:  Procedure Laterality Date  . CHOLECYSTECTOMY  1992  . COLONOSCOPY WITH PROPOFOL N/A 08/28/2015   Procedure: COLONOSCOPY WITH PROPOFOL;  Surgeon: [name], MD;  Location: [place];  Service: Endoscopy;  Laterality: N/A;  PER PT PLEASE LEAVE LAST NEEDS INTERPRETER  . POLYPECTOMY  08/28/2015   Procedure: POLYPECTOMY;  Surgeon: [name], MD;  Location: [facility];  Service: Endoscopy;;  . SHOULDER SURGERY Right   . SPINAL CORD STIMULATOR IMPLANT Right Jan 2013  . WRIST SURGERY Right    right hand and wrist reconstruction    Home Medications:  Allergies as of 09/05/2017      Reactions   Trazodone And Nefazodone Anaphylaxis   Hydrocodone Nausea And Vomiting        Medication List        Accurate as of 09/05/17 10:47 AM. Always use your most recent med list.          calcium-vitamin D 500-200 MG-UNIT tablet Commonly known as:  OSCAL WITH D Take 1 tablet by mouth.   cyclobenzaprine 10 MG tablet Commonly known as:  FLEXERIL Take 10 mg by mouth 3 (three) times daily as needed for muscle spasms.   lisinopril-hydrochlorothiazide 20-12.5 MG tablet Commonly known as:  PRINZIDE,ZESTORETIC Take 1 tablet by mouth daily.   meloxicam 15 MG tablet Commonly known as:  MOBIC Take 1 tablet (15 mg total) by mouth daily.   omeprazole 20 MG capsule Commonly known as:  PRILOSEC Take 20 mg by mouth 2 (two) times daily.   oxybutynin 5 MG tablet Commonly known as:  DITROPAN Take 1 tablet (5 mg total) by mouth 3 (three) times daily.   sertraline 50 MG tablet Commonly known as:  ZOLOFT Take 50 mg by mouth daily.   traMADol 50 MG tablet Commonly known as:  ULTRAM Take 1 tablet (50 mg total) by mouth every 6 (six) hours as needed for severe pain. Start taking on:  09/17/2017   VITAMIN B 12 PO Take 1 tablet by mouth daily.       Allergies:  Allergies  Allergen Reactions  . Trazodone And Nefazodone Anaphylaxis  . Hydrocodone Nausea And Vomiting    Family History: Family History  Problem Relation Age of Onset  . Cancer Father   . Heart disease Father  Social History:  reports that she has never smoked. She has never used smokeless tobacco. She reports that she does not drink alcohol or use drugs.  ROS: UROLOGY Frequent Urination?: Yes Hard to postpone urination?: No Burning/pain with urination?: Yes Get up at night to urinate?: Yes Leakage of urine?: Yes Urine stream starts and stops?: No Trouble starting stream?: No Do you have to strain to urinate?: No Blood in urine?: No Urinary tract infection?: No Sexually transmitted disease?: No Injury to kidneys or bladder?: No Painful intercourse?: Yes Weak stream?: No Currently  pregnant?: No Vaginal bleeding?: No Last menstrual period?: n  Gastrointestinal Nausea?: No Vomiting?: No Indigestion/heartburn?: No Diarrhea?: No Constipation?: No  Constitutional Fever: No Night sweats?: No Weight loss?: Yes Fatigue?: Yes  Skin Skin rash/lesions?: No Itching?: Yes  Eyes Blurred vision?: No Double vision?: No  Ears/Nose/Throat Sore throat?: No Sinus problems?: No  Hematologic/Lymphatic Swollen glands?: No Easy bruising?: No  Cardiovascular Leg swelling?: No Chest pain?: No  Respiratory Cough?: No Shortness of breath?: No  Endocrine Excessive thirst?: Yes  Musculoskeletal Back pain?: Yes Joint pain?: Yes  Neurological Headaches?: No Dizziness?: No  Psychologic Depression?: Yes Anxiety?: Yes  Physical Exam: BP 132/83 (BP Location: Left Arm, Patient Position: Sitting, Cuff Size: Normal)   Pulse 77   Ht 5\' 5"  (1.651 m)   Wt 157 lb 9.6 oz (71.5 kg)   BMI 26.23 kg/m   Constitutional:  Alert and oriented, No acute distress. HEENT:  AT, moist mucus membranes.  Trachea midline, no masses. Cardiovascular: No clubbing, cyanosis, or edema. Respiratory: Normal respiratory effort, no increased work of breathing. GI: Abdomen is soft, nontender, nondistended, no abdominal masses GU: On pelvic examination the patient had mild grade 2 hyper mobility of the bladder neck and a negative cough test.  She had a grade 1 cystocele.  She had a suburethral swelling that may have been a little bit tender though her tissues may have been a little bit dry.  It was underneath and on both sides of the urethra.  She had a grade 1 rectocele. Skin: No rashes, bruises or suspicious lesions. Lymph: No cervical or inguinal adenopathy. Neurologic: Grossly intact, no focal deficits, moving all 4 extremities. Psychiatric: Normal mood and affect.  Laboratory Data: Lab Results  Component Value Date   WBC 7.9 12/12/2016   HGB 13.4 12/12/2016   HCT 40.0  12/12/2016   MCV 83.5 12/12/2016   PLT 242 12/12/2016    Lab Results  Component Value Date   CREATININE 0.62 01/11/2017    No results found for: PSA  No results found for: TESTOSTERONE  No results found for: HGBA1C  Urinalysis    Component Value Date/Time   APPEARANCEUR Clear 06/02/2017 1337   GLUCOSEU Negative 06/02/2017 1337   BILIRUBINUR Negative 06/02/2017 1337   PROTEINUR Negative 06/02/2017 1337   NITRITE Negative 06/02/2017 1337   LEUKOCYTESUR Trace (A) 06/02/2017 1337    Pertinent Imaging:   Assessment & Plan: Patient has milder mixed incontinence refractory to medications.  The role of urodynamics discussed.  Picture was drawn.  My index of suspicion is lower that she has a diverticulum.  Having said that even if the urodynamics did not see when she would need an MRI if she ever went on to have a sling.  Think the patient has a chronic pain stimulator.  She cannot have an MRI.  I would therefore need to order a CT scan and speak to radiology prior.  We will pay special attention  during urodynamics to see if she has a urethral diverticulum  There are no diagnoses linked to this encounter.  No follow-ups on file.  Reece Packer, MD  [facility] [address] [place], [place] [address] [phone]

## 2017-09-07 ENCOUNTER — Ambulatory Visit: Payer: Medicare Other | Admitting: Physical Therapy

## 2017-09-07 DIAGNOSIS — M6281 Muscle weakness (generalized): Secondary | ICD-10-CM

## 2017-09-07 DIAGNOSIS — G8929 Other chronic pain: Secondary | ICD-10-CM

## 2017-09-07 DIAGNOSIS — R279 Unspecified lack of coordination: Secondary | ICD-10-CM

## 2017-09-07 DIAGNOSIS — M25511 Pain in right shoulder: Secondary | ICD-10-CM

## 2017-09-07 DIAGNOSIS — M542 Cervicalgia: Secondary | ICD-10-CM
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
## 2017-09-07 NOTE — Patient Instructions (Addendum)
Try to drink herbal tea after walking and stretches.  --> bath --> breathing/ relaxation in bed at 10:30pm    __   Deep core level 1 and 2 with pillow under hips/ back   Softer inhale and not straining downward Exhale quick squeeze   10 x   ___  Walking with shoulders and center of mass slightly forward  Longer steps

## 2017-09-14 ENCOUNTER — Ambulatory Visit: Payer: Medicare Other | Attending: Urology | Admitting: Physical Therapy

## 2017-09-14 DIAGNOSIS — M542 Cervicalgia: Secondary | ICD-10-CM | POA: Insufficient documentation

## 2017-09-14 DIAGNOSIS — M25511 Pain in right shoulder: Secondary | ICD-10-CM | POA: Insufficient documentation

## 2017-09-14 DIAGNOSIS — M6281 Muscle weakness (generalized): Secondary | ICD-10-CM | POA: Insufficient documentation

## 2017-09-14 DIAGNOSIS — R279 Unspecified lack of coordination: Secondary | ICD-10-CM | POA: Insufficient documentation

## 2017-09-14 DIAGNOSIS — G8929 Other chronic pain: Secondary | ICD-10-CM | POA: Insufficient documentation

## 2017-09-20 ENCOUNTER — Other Ambulatory Visit: Payer: Self-pay | Admitting: Urology

## 2017-09-21 ENCOUNTER — Ambulatory Visit: Payer: Medicare Other | Admitting: Physical Therapy

## 2017-09-27 ENCOUNTER — Ambulatory Visit
Admission: RE | Admit: 2017-09-27 | Discharge: 2017-09-27 | Disposition: A | Discharge status: Home / Self Care | Payer: Medicare Other | Source: Ambulatory Visit | Attending: Family Medicine | Admitting: Family Medicine

## 2017-09-27 DIAGNOSIS — Z1231 Encounter for screening mammogram for malignant neoplasm of breast: Secondary | ICD-10-CM | POA: Insufficient documentation

## 2017-09-28 ENCOUNTER — Ambulatory Visit: Payer: Medicare Other | Admitting: Physical Therapy

## 2017-09-28 DIAGNOSIS — R279 Unspecified lack of coordination: Secondary | ICD-10-CM

## 2017-09-28 DIAGNOSIS — M25511 Pain in right shoulder: Secondary | ICD-10-CM | POA: Diagnosis present

## 2017-09-28 DIAGNOSIS — M6281 Muscle weakness (generalized): Secondary | ICD-10-CM | POA: Diagnosis not present

## 2017-09-28 DIAGNOSIS — M542 Cervicalgia: Secondary | ICD-10-CM | POA: Diagnosis present

## 2017-09-28 DIAGNOSIS — G8929 Other chronic pain: Secondary | ICD-10-CM | POA: Diagnosis present
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
## 2017-09-28 NOTE — Patient Instructions (Signed)
PELVIC FLOOR / KEGEL EXERCISES   Pelvic floor/ Kegel exercises are used to strengthen the muscles in the base of your pelvis that are responsible for supporting your pelvic organs and preventing urine/feces leakage. Based on your therapist's recommendations, they can be performed while standing, sitting, or lying down. Imagine pelvic floor area as a diamond with pelvic landmarks: top =pubic bone, bottom tip=tailbone, sides=sitting bones (ischial tuberosities).    Make yourself aware of this muscle group by using these cues while coordinating your breath:  Inhale, feel pelvic floor diamond area lower like hammock towards your feet and ribcage/belly expanding. Pause. Let the exhale naturally and feel your belly sink, abdominal muscles hugging in around you and you may notice the pelvic diamond draws upward towards your head forming a umbrella shape. Give a squeeze during the exhalation like you are stopping the flow of urine. If you are squeezing the buttock muscles, try to give 50% less effort.   Common Errors:  Breath holding: If you are holding your breath, you may be bearing down against your bladder instead of pulling it up. If you belly bulges up while you are squeezing, you are holding your breath. Be sure to breathe gently in and out while exercising. Counting out loud may help you avoid holding your breath.  Accessory muscle use: You should not see or feel other muscle movement when performing pelvic floor exercises. When done properly, no one can tell that you are performing the exercises. Keep the buttocks, belly and inner thighs relaxed.  Overdoing it: Your muscles can fatigue and stop working for you if you over-exercise. You may actually leak more or feel soreness at the lower abdomen or rectum.  YOUR HOME EXERCISE PROGRAM  LONG HOLDS:   Position: on back with pillow under hips     Inhale and then exhale. Then squeeze the muscle and count aloud for 5 seconds. Rest with three  long breaths. (Be sure to let belly sink in with exhales and not push outward)  Perform 3 repetitions, 3 times/day     PRACTICE SEATED BREATHING - EXPAND RIBS WITH INHALE NOT UP INTO THE CHEST   5 breaths, through out the day x 5 x                    DECREASE DOWNWARD PRESSURE ON  YOUR PELVIC FLOOR, ABDOMINAL, LOW BACK MUSCLES       PRESERVE YOUR PELVIC HEALTH LONG-TERM   ** SQUEEZE pelvic floor BEFORE YOUR SNEEZE, COUGH, LAUGH   ** EXHALE BEFORE YOU RISE AGAINST GRAVITY (lifting, sit to stand, from squat to stand)   ** LOG ROLL OUT OF BED INSTEAD OF CRUNCH/SIT-UP

## 2017-10-05 ENCOUNTER — Ambulatory Visit: Payer: Medicare Other | Admitting: Physical Therapy

## 2017-10-12 ENCOUNTER — Ambulatory Visit: Payer: Medicare Other | Attending: Nurse Practitioner | Admitting: Nurse Practitioner

## 2017-10-12 ENCOUNTER — Encounter: Payer: Self-pay | Admitting: Nurse Practitioner

## 2017-10-12 VITALS — BP 162/84 | HR 78 | Temp 98.5°F | Resp 16 | Ht 65.0 in | Wt 154.0 lb

## 2017-10-12 DIAGNOSIS — M545 Low back pain: Secondary | ICD-10-CM | POA: Diagnosis not present

## 2017-10-12 DIAGNOSIS — Z885 Allergy status to narcotic agent status: Secondary | ICD-10-CM | POA: Diagnosis not present

## 2017-10-12 DIAGNOSIS — M5010 Cervical disc disorder with radiculopathy, unspecified cervical region: Secondary | ICD-10-CM | POA: Insufficient documentation

## 2017-10-12 DIAGNOSIS — Z79899 Other long term (current) drug therapy: Secondary | ICD-10-CM | POA: Insufficient documentation

## 2017-10-12 DIAGNOSIS — I10 Essential (primary) hypertension: Secondary | ICD-10-CM | POA: Diagnosis not present

## 2017-10-12 DIAGNOSIS — E559 Vitamin D deficiency, unspecified: Secondary | ICD-10-CM | POA: Insufficient documentation

## 2017-10-12 DIAGNOSIS — M4722 Other spondylosis with radiculopathy, cervical region: Secondary | ICD-10-CM | POA: Diagnosis not present

## 2017-10-12 DIAGNOSIS — Z791 Long term (current) use of non-steroidal anti-inflammatories (NSAID): Secondary | ICD-10-CM | POA: Insufficient documentation

## 2017-10-12 DIAGNOSIS — M79601 Pain in right arm: Secondary | ICD-10-CM | POA: Insufficient documentation

## 2017-10-12 DIAGNOSIS — G894 Chronic pain syndrome: Secondary | ICD-10-CM | POA: Insufficient documentation

## 2017-10-12 DIAGNOSIS — M5412 Radiculopathy, cervical region: Secondary | ICD-10-CM

## 2017-10-12 DIAGNOSIS — M25511 Pain in right shoulder: Secondary | ICD-10-CM

## 2017-10-12 DIAGNOSIS — K219 Gastro-esophageal reflux disease without esophagitis: Secondary | ICD-10-CM | POA: Diagnosis not present

## 2017-10-12 DIAGNOSIS — M25512 Pain in left shoulder: Secondary | ICD-10-CM | POA: Diagnosis not present

## 2017-10-12 DIAGNOSIS — F418 Other specified anxiety disorders: Secondary | ICD-10-CM | POA: Insufficient documentation

## 2017-10-12 DIAGNOSIS — G8929 Other chronic pain: Secondary | ICD-10-CM | POA: Diagnosis not present

## 2017-10-12 DIAGNOSIS — Z5181 Encounter for therapeutic drug level monitoring: Secondary | ICD-10-CM | POA: Insufficient documentation

## 2017-10-12 DIAGNOSIS — M79602 Pain in left arm: Secondary | ICD-10-CM | POA: Diagnosis not present

## 2017-10-12 NOTE — Progress Notes (Signed)
Nursing Pain Medication Assessment:  Safety precautions to be maintained throughout the outpatient stay will include: orient to surroundings, keep bed in low position, maintain call bell within reach at all times, provide assistance with transfer out of bed and ambulation.  Medication Inspection Compliance: Alyssa Wood did not comply with our request to bring her pills to be counted. She was reminded that bringing the medication bottles, even when empty, is a requirement.  Medication: None brought in. Pill/Patch Count: None available to be counted. Bottle Appearance: No container available. Did not bring bottle(s) to appointment. Filled Date: N/A Last Medication intake:  Today 

## 2017-10-12 NOTE — Patient Instructions (Signed)
____________________________________________________________________________________________  Medication Rules  Applies to: All patients receiving prescriptions (written or electronic).  Pharmacy of record: Pharmacy where electronic prescriptions will be sent. If written prescriptions are taken to a different pharmacy, please inform the nursing staff. The pharmacy listed in the electronic medical record should be the one where you would like electronic prescriptions to be sent.  Prescription refills: Only during scheduled appointments. Applies to both, written and electronic prescriptions.  NOTE: The following applies primarily to controlled substances (Opioid* Pain Medications).   Patient's responsibilities: 1. Pain Pills: Bring all pain pills to every appointment (except for procedure appointments). 2. Pill Bottles: Bring pills in original pharmacy bottle. Always bring newest bottle. Bring bottle, even if empty. 3. Medication refills: You are responsible for knowing and keeping track of what medications you need refilled. The day before your appointment, write a list of all prescriptions that need to be refilled. Bring that list to your appointment and give it to the admitting nurse. Prescriptions will be written only during appointments. If you forget a medication, it will not be "Called in", "Faxed", or "electronically sent". You will need to get another appointment to get these prescribed. 4. Prescription Accuracy: You are responsible for carefully inspecting your prescriptions before leaving our office. Have the discharge nurse carefully go over each prescription with you, before taking them home. Make sure that your name is accurately spelled, that your address is correct. Check the name and dose of your medication to make sure it is accurate. Check the number of pills, and the written instructions to make sure they are clear and accurate. Make sure that you are given enough medication to last  until your next medication refill appointment. 5. Taking Medication: Take medication as prescribed. Never take more pills than instructed. Never take medication more frequently than prescribed. Taking less pills or less frequently is permitted and encouraged, when it comes to controlled substances (written prescriptions).  6. Inform other Doctors: Always inform, all of your healthcare providers, of all the medications you take. 7. Pain Medication from other Providers: You are not allowed to accept any additional pain medication from any other Doctor or Healthcare provider. There are two exceptions to this rule. (see below) In the event that you require additional pain medication, you are responsible for notifying us, as stated below. 8. Medication Agreement: You are responsible for carefully reading and following our Medication Agreement. This must be signed before receiving any prescriptions from our practice. Safely store a copy of your signed Agreement. Violations to the Agreement will result in no further prescriptions. (Additional copies of our Medication Agreement are available upon request.) 9. Laws, Rules, & Regulations: All patients are expected to follow all Federal and State Laws, Statutes, Rules, & Regulations. Ignorance of the Laws does not constitute a valid excuse. The use of any illegal substances is prohibited. 10. Adopted CDC guidelines & recommendations: Target dosing levels will be at or below 60 MME/day. Use of benzodiazepines** is not recommended.  Exceptions: There are only two exceptions to the rule of not receiving pain medications from other Healthcare Providers. 1. Exception #1 (Emergencies): In the event of an emergency (i.e.: accident requiring emergency care), you are allowed to receive additional pain medication. However, you are responsible for: As soon as you are able, call our office [phone], at any time of the day or night, and leave a message stating your name, the  date and nature of the emergency, and the name and dose of the medication   prescribed. In the event that your call is answered by a member of our staff, make sure to document and save the date, time, and the name of the person that took your information.  2. Exception #2 (Planned Surgery): In the event that you are scheduled by another doctor or dentist to have any type of surgery or procedure, you are allowed (for a period no longer than 30 days), to receive additional pain medication, for the acute post-op pain. However, in this case, you are responsible for picking up a copy of our "Post-op Pain Management for Surgeons" handout, and giving it to your surgeon or dentist. This document is available at our office, and does not require an appointment to obtain it. Simply go to our office during business hours (Monday-Thursday from 8:00 AM to 4:00 PM) (Friday 8:00 AM to 12:00 Noon) or if you have a scheduled appointment with us, prior to your surgery, and ask for it by name. In addition, you will need to provide us with your name, name of your surgeon, type of surgery, and date of procedure or surgery.  *Opioid medications include: morphine, codeine, oxycodone, oxymorphone, hydrocodone, hydromorphone, meperidine, tramadol, tapentadol, buprenorphine, fentanyl, methadone. **Benzodiazepine medications include: diazepam (Valium), alprazolam (Xanax), clonazepam (Klonopine), lorazepam (Ativan), clorazepate (Tranxene), chlordiazepoxide (Librium), estazolam (Prosom), oxazepam (Serax), temazepam (Restoril), triazolam (Halcion) (Last updated: 04/07/2017) ____________________________________________________________________________________________    
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
## 2017-10-17 ENCOUNTER — Encounter: Payer: Self-pay | Admitting: Urology

## 2017-10-17 ENCOUNTER — Ambulatory Visit (INDEPENDENT_AMBULATORY_CARE_PROVIDER_SITE_OTHER): Payer: Medicare Other | Admitting: Urology

## 2017-10-17 VITALS — BP 138/76 | HR 81 | Ht 65.0 in | Wt 156.0 lb

## 2017-10-17 DIAGNOSIS — N3946 Mixed incontinence: Secondary | ICD-10-CM

## 2017-10-17 MED ORDER — FESOTERODINE FUMARATE ER 8 MG PO TB24: 8.0000 mg | ORAL_TABLET | Freq: Every day | ORAL | 11 refills | Status: DC

## 2017-10-17 NOTE — Progress Notes (Signed)
10/17/2017 1:37 PM   Alyssa Wood 23-Jun-1960 [id number]  Referring provider: [facility], [place], [place] [address]  No chief complaint on file.   HPI: The patient leaks with coughing sneezing bending and lifting.  She can leak with walking.  She has urge incontinence and both are significant.  She wears 2 pads per day which are damp.  She denies bedwetting.  She failed oxybutynin and the Myrbetriq  On pelvic examination the patient had mild grade 2 hyper mobility of the bladder neck and a negative cough test.  She had a grade 1 cystocele.  She had a suburethral swelling that may have been a little bit tender though her tissues may have been a little bit dry.  It was underneath and on both sides of the urethra.  She had a grade 1 rectocele.  Patient has milder mixed incontinence refractory to medications.  The role of urodynamics discussed.   My index of suspicion is lower that she has a diverticulum.  Having said that even if the urodynamics did not see when she would need an MRI if she ever went on to have a sling.  Think the patient has a chronic pain stimulator.  She cannot have an MRI.  I would therefore need to order a CT scan and speak to radiology prior.  We will pay special attention during urodynamics to see if she has a urethral diverticulum  Today Incontinence and frequency stable.  On urodynamics the patient was catheterized for 75 mL.  Maximum bladder capacity was 300 mL.  Patient had sensory urgency.  Bladder was unstable reaching a pressure of 6 cm of water.  She had to void off the contraction.  She did not leak with a Valsalva pressure of 83 cm of water.  During voluntary voiding the patient voided 146 mL with a maximum flow of 15 mils per second.  Maximum voiding pressure was 28 cm of water.  She emptied efficiently.  EMG activity increased during voiding.  Bladder neck descended 2 cm.  There was some bulging of  bladder neck and no evidence of a diverticulum.  The details of the urodynamics are signed and dictated  Patient does report she can leak a small amount or larger amount when she coughs or sneezes and she leaks with walking.  She still has urge incontinence.  Physical therapy was helping initially but she does not think it is currently helping her and is ongoing and may stop it  Pathophysiology discussed.  She would like to try Toviaz 8 mg samples and prescription      PMH: Past Medical History:  Diagnosis Date  . Anxiety   . Chronic pain   . GERD (gastroesophageal reflux disease)   . Hypertension     Surgical History: Past Surgical History:  Procedure Laterality Date  . CHOLECYSTECTOMY  1992  . COLONOSCOPY WITH PROPOFOL N/A 08/28/2015   Procedure: COLONOSCOPY WITH PROPOFOL;  Surgeon: [name], MD;  Location: [facility];  Service: Endoscopy;  Laterality: N/A;  PER PT PLEASE LEAVE LAST NEEDS INTERPRETER  . POLYPECTOMY  08/28/2015   Procedure: POLYPECTOMY;  Surgeon: [name], MD;  Location: [facility];  Service: Endoscopy;;  . SHOULDER SURGERY Right   . SPINAL CORD STIMULATOR IMPLANT Right Jan 2013  . WRIST SURGERY Right    right hand and wrist reconstruction    Home Medications:  Allergies as of 10/17/2017      Reactions  Trazodone And Nefazodone Anaphylaxis   Hydrocodone Nausea And Vomiting      Medication List        Accurate as of 10/17/17  1:37 PM. Always use your most recent med list.          calcium-vitamin D 500-200 MG-UNIT tablet Commonly known as:  OSCAL WITH D Take 1 tablet by mouth.   cyclobenzaprine 10 MG tablet Commonly known as:  FLEXERIL Take 10 mg by mouth 3 (three) times daily as needed for muscle spasms.   lisinopril-hydrochlorothiazide 20-12.5 MG tablet Commonly known as:  PRINZIDE,ZESTORETIC Take 1 tablet by mouth daily.   meloxicam 15 MG tablet Commonly known as:  MOBIC Take 1 tablet (15 mg total) by mouth  daily.   omeprazole 20 MG capsule Commonly known as:  PRILOSEC Take 20 mg by mouth 2 (two) times daily as needed.   sertraline 50 MG tablet Commonly known as:  ZOLOFT Take 50 mg by mouth daily.   traMADol 50 MG tablet Commonly known as:  ULTRAM Take 1 tablet (50 mg total) by mouth every 6 (six) hours as needed for severe pain.   VITAMIN B 12 PO Take 1 tablet by mouth daily.       Allergies:  Allergies  Allergen Reactions  . Trazodone And Nefazodone Anaphylaxis  . Hydrocodone Nausea And Vomiting    Family History: Family History  Problem Relation Age of Onset  . Cancer Father   . Heart disease Father     Social History:  reports that she has never smoked. She has never used smokeless tobacco. She reports that she does not drink alcohol or use drugs.  ROS:                                        Physical Exam: There were no vitals taken for this visit.  Constitutional:  Alert and oriented, No acute distress.   Laboratory Data: Lab Results  Component Value Date   WBC 7.9 12/12/2016   HGB 13.4 12/12/2016   HCT 40.0 12/12/2016   MCV 83.5 12/12/2016   PLT 242 12/12/2016    Lab Results  Component Value Date   CREATININE 0.62 01/11/2017    No results found for: PSA  No results found for: TESTOSTERONE  No results found for: HGBA1C  Urinalysis    Component Value Date/Time   APPEARANCEUR Clear 06/02/2017 1337   GLUCOSEU Negative 06/02/2017 1337   BILIRUBINUR Negative 06/02/2017 1337   PROTEINUR Negative 06/02/2017 1337   NITRITE Negative 06/02/2017 1337   LEUKOCYTESUR Trace (A) 06/02/2017 1337    Pertinent Imaging:   Assessment & Plan: In 6 weeks if she still is leaking on the Toviaz 8 mg I will draw a picture and talk about a sling cystourethropexy.  If she decides to have surgery I will order a pelvic CT scan with contrast looking for the presence of a small diverticulum.  Recognizing its sensitivity if negative I think we  are safe to proceed with a sling.  I think a sling would be better than a refractory therapy.  Persisting or worsening OAB symptoms would need to be highlighted  There are no diagnoses linked to this encounter.  No follow-ups on file.  Reece Packer, MD  [facility] [address] [place], [place] [address] [phone]

## 2017-11-17 ENCOUNTER — Encounter: Payer: Medicare Other | Admitting: Physical Therapy

## 2017-11-28 ENCOUNTER — Encounter: Payer: Self-pay | Admitting: Urology

## 2017-11-28 ENCOUNTER — Ambulatory Visit (INDEPENDENT_AMBULATORY_CARE_PROVIDER_SITE_OTHER): Payer: Medicare Other | Admitting: Urology

## 2017-11-28 VITALS — BP 165/94 | HR 83 | Ht 65.0 in | Wt 155.6 lb

## 2017-11-28 DIAGNOSIS — N2889 Other specified disorders of kidney and ureter: Secondary | ICD-10-CM

## 2017-11-28 DIAGNOSIS — N3946 Mixed incontinence: Secondary | ICD-10-CM | POA: Diagnosis not present

## 2017-11-28 DIAGNOSIS — R350 Frequency of micturition: Secondary | ICD-10-CM | POA: Diagnosis not present

## 2017-11-28 LAB — URINALYSIS, COMPLETE
Bilirubin, UA: NEGATIVE
Glucose, UA: NEGATIVE
KETONES UA: NEGATIVE
Leukocytes, UA: NEGATIVE
Nitrite, UA: NEGATIVE
SPEC GRAV UA: 1.025 (ref 1.005–1.030)
Urobilinogen, Ur: 0.2 mg/dL (ref 0.2–1.0)
pH, UA: 5.5 (ref 5.0–7.5)

## 2017-11-28 LAB — MICROSCOPIC EXAMINATION

## 2017-11-28 NOTE — Progress Notes (Signed)
11/28/2017 8:43 AM   Alyssa Wood Oct 28, 1960 [id number]  Referring provider: [facility], [name], [place] [address]  Chief Complaint  Patient presents with  . Follow-up    HPI: The patient leaks with coughing sneezing bending and lifting. She can leak with walking. She has urge incontinence and both are significant. She wears 2 pads per day which are damp. She denies bedwetting. She failed oxybutynin and the Myrbetriq  On pelvic examination the patient had mild grade 2 hyper mobility of the bladder neck and a negative cough test. She had a grade 1 cystocele. She had a suburethral swelling that may have been a little bit tender though her tissues may have been a little bit dry. It was underneath and on both sides of the urethra. She had a grade 1 rectocele.  Patient has milder mixed incontinence refractory to medications. The role of urodynamics discussed.My index of suspicion is lower that she has a diverticulum. Having said that even if the urodynamics did not see when she would need an MRI if she ever went on to have a sling. Think the patient has a chronic pain stimulator. She cannot have an MRI. I would therefore need to order a CT scan and speak to radiology prior.   On urodynamics the patient was catheterized for 75 mL.  Maximum bladder capacity was 300 mL.  Patient had sensory urgency.  Bladder was unstable reaching a pressure of 6 cm of water.  She had to void off the contraction.  She did not leak with a Valsalva pressure of 83 cm of water.  During voluntary voiding the patient voided 146 mL with a maximum flow of 15 mils per second.  Maximum voiding pressure was 28 cm of water.  She emptied efficiently.  EMG activity increased during voiding.  Bladder neck descended 2 cm.  There was some bulging of bladder neck and no evidence of a diverticulum.    Patient does report she can leak a small amount or  larger amount when she coughs or sneezes and she leaks with walking.  She still has urge incontinence.  Physical therapy was helping initially but she does not think it is currently helping her and is ongoing and may stop it  In 6 weeks if she still is leaking on the Toviaz 8 mg I will draw a picture and talk about a sling cystourethropexy.  If she decides to have surgery I will order a pelvic CT scan with contrast looking for the presence of a small diverticulum.  Recognizing its sensitivity if negative I think we are safe to proceed with a sling.  I think a sling would be better than a refractory therapy.  Persisting or worsening OAB symptoms would need to be highlighted  Today Patient was not helped by the [place].  She had mild relief with Myrbetriq.  I spent approximately 30 minutes with the patient counseling them regarding the sling with the translator.  Picture was drawn.  I went through the sling in complete detail.  She understands her overactive bladder symptoms could persist in approximately 50% of cases.  I think the leaking with walking which is a primary symptom is from her stress incontinence.  Clinically not infected today.  She understands that I will get a CT scan of the pelvis with contrast recognizing sensitivity to rule out a urethral diverticulum.  She would like to have a sling in January.  She is very much bothered  by the symptoms.  Clinically not infected today and frequency stable  PMH: Past Medical History:  Diagnosis Date  . Anxiety   . Chronic pain   . GERD (gastroesophageal reflux disease)   . Hypertension     Surgical History: Past Surgical History:  Procedure Laterality Date  . CHOLECYSTECTOMY  1992  . COLONOSCOPY WITH PROPOFOL N/A 08/28/2015   Procedure: COLONOSCOPY WITH PROPOFOL;  Surgeon: [name], MD;  Location: [facility];  Service: Endoscopy;  Laterality: N/A;  PER PT PLEASE LEAVE LAST NEEDS INTERPRETER  . POLYPECTOMY  08/28/2015   Procedure:  POLYPECTOMY;  Surgeon: [name], MD;  Location: [facility];  Service: Endoscopy;;  . SHOULDER SURGERY Right   . SPINAL CORD STIMULATOR IMPLANT Right Jan 2013  . WRIST SURGERY Right    right hand and wrist reconstruction    Home Medications:  Allergies as of 11/28/2017      Reactions   Trazodone And Nefazodone Anaphylaxis   Hydrocodone Nausea And Vomiting      Medication List        Accurate as of 11/28/17  8:43 AM. Always use your most recent med list.          calcium-vitamin D 500-200 MG-UNIT tablet Commonly known as:  OSCAL WITH D Take 1 tablet by mouth.   cyclobenzaprine 10 MG tablet Commonly known as:  FLEXERIL Take 10 mg by mouth 3 (three) times daily as needed for muscle spasms.   fesoterodine 8 MG Tb24 tablet Commonly known as:  TOVIAZ Take 1 tablet (8 mg total) by mouth daily.   lisinopril-hydrochlorothiazide 20-12.5 MG tablet Commonly known as:  PRINZIDE,ZESTORETIC Take 1 tablet by mouth daily.   meloxicam 15 MG tablet Commonly known as:  MOBIC Take 1 tablet (15 mg total) by mouth daily.   omeprazole 20 MG capsule Commonly known as:  PRILOSEC Take 20 mg by mouth 2 (two) times daily as needed.   sertraline 50 MG tablet Commonly known as:  ZOLOFT Take 50 mg by mouth daily.   traMADol 50 MG tablet Commonly known as:  ULTRAM Take 1 tablet (50 mg total) by mouth every 6 (six) hours as needed for severe pain.   VITAMIN B 12 PO Take 1 tablet by mouth daily.       Allergies:  Allergies  Allergen Reactions  . Trazodone And Nefazodone Anaphylaxis  . Hydrocodone Nausea And Vomiting    Family History: Family History  Problem Relation Age of Onset  . Cancer Father   . Heart disease Father     Social History:  reports that she has never smoked. She has never used smokeless tobacco. She reports that she does not drink alcohol or use drugs.  ROS: UROLOGY Frequent Urination?: Yes Hard to postpone urination?: Yes Burning/pain with  urination?: Yes Get up at night to urinate?: Yes Leakage of urine?: Yes Urine stream starts and stops?: Yes Trouble starting stream?: Yes Do you have to strain to urinate?: Yes Blood in urine?: No Urinary tract infection?: No Sexually transmitted disease?: No Injury to kidneys or bladder?: No Painful intercourse?: No Weak stream?: No Currently pregnant?: No Vaginal bleeding?: No Last menstrual period?: n  Gastrointestinal Nausea?: No Indigestion/heartburn?: No Diarrhea?: No Constipation?: No  Constitutional Fever: No Night sweats?: No Weight loss?: No  Skin Skin rash/lesions?: No Itching?: No  Eyes Blurred vision?: No Double vision?: No  Ears/Nose/Throat Sore throat?: No Sinus problems?: No  Hematologic/Lymphatic Swollen glands?: No Easy bruising?: No  Cardiovascular Chest pain?: No  Respiratory Cough?: No Shortness of breath?: No  Endocrine Excessive thirst?: No  Musculoskeletal Back pain?: No Joint pain?: No  Neurological Headaches?: No Dizziness?: No  Psychologic Depression?: Yes Anxiety?: No  Physical Exam: There were no vitals taken for this visit.  Constitutional:  Alert and oriented, No acute distress.   Laboratory Data: Lab Results  Component Value Date   WBC 7.9 12/12/2016   HGB 13.4 12/12/2016   HCT 40.0 12/12/2016   MCV 83.5 12/12/2016   PLT 242 12/12/2016    Lab Results  Component Value Date   CREATININE 0.62 01/11/2017    No results found for: PSA  No results found for: TESTOSTERONE  No results found for: HGBA1C  Urinalysis    Component Value Date/Time   APPEARANCEUR Clear 06/02/2017 1337   GLUCOSEU Negative 06/02/2017 1337   BILIRUBINUR Negative 06/02/2017 1337   PROTEINUR Negative 06/02/2017 1337   NITRITE Negative 06/02/2017 1337   LEUKOCYTESUR Trace (A) 06/02/2017 1337    Pertinent Imaging:   Assessment & Plan: The patient will have a CT scan.  We will schedule a sling in January.  I will call if  the result is abnormal.  I use my usual complete template  1. Urinary frequency  - Urinalysis, Complete   No follow-ups on file.  Reece Packer, MD  [facility] [address] [place], [place] [address] [phone]

## 2017-11-30 ENCOUNTER — Other Ambulatory Visit: Payer: Self-pay | Admitting: Family Medicine

## 2017-11-30 ENCOUNTER — Encounter: Payer: Medicare Other | Admitting: Physical Therapy

## 2017-11-30 DIAGNOSIS — R32 Unspecified urinary incontinence: Secondary | ICD-10-CM

## 2017-11-30 DIAGNOSIS — R350 Frequency of micturition: Secondary | ICD-10-CM

## 2017-11-30 DIAGNOSIS — N3946 Mixed incontinence: Secondary | ICD-10-CM

## 2017-12-01 LAB — CULTURE, URINE COMPREHENSIVE

## 2017-12-12 ENCOUNTER — Other Ambulatory Visit: Payer: Self-pay | Admitting: Urology

## 2017-12-12 DIAGNOSIS — R1900 Intra-abdominal and pelvic swelling, mass and lump, unspecified site: Secondary | ICD-10-CM

## 2017-12-12 DIAGNOSIS — N361 Urethral diverticulum: Secondary | ICD-10-CM

## 2017-12-14 ENCOUNTER — Telehealth: Payer: Self-pay | Admitting: Urology

## 2017-12-14 NOTE — Telephone Encounter (Signed)
Patient called via the interpreter line spoke with Kennyth Lose and she states that she is now hurting a lot more and now wants to have her bladder sling surgery sooner than January. She would like to have this moved up. She is scheduling her CT scan now. There was a mix up in the order and he ordered a MRI instead of the CT and so she is just now getting that scheduled.  Please reached out to the patient as she states that she is in a lot of pain   Thanks, Sharyn Lull

## 2017-12-16 NOTE — Telephone Encounter (Signed)
Darrick Meigs ID #[id number] with [employer] [facility] asking patient to return call by first calling [place] at [phone] & asking them to call Toya Palacios at [phone], Option 3. Need to discuss sling cystourethropexy scheduled with Dr Matilde Sprang on 02/06/2018.

## 2017-12-20 ENCOUNTER — Other Ambulatory Visit: Payer: Self-pay | Admitting: Radiology

## 2017-12-20 ENCOUNTER — Ambulatory Visit
Admission: RE | Admit: 2017-12-20 | Discharge: 2017-12-20 | Disposition: A | Discharge status: Home / Self Care | Payer: Medicare Other | Source: Ambulatory Visit | Attending: Urology | Admitting: Urology

## 2017-12-20 DIAGNOSIS — N361 Urethral diverticulum: Secondary | ICD-10-CM | POA: Insufficient documentation

## 2017-12-20 DIAGNOSIS — R1900 Intra-abdominal and pelvic swelling, mass and lump, unspecified site: Secondary | ICD-10-CM | POA: Diagnosis not present

## 2017-12-20 DIAGNOSIS — N393 Stress incontinence (female) (male): Secondary | ICD-10-CM

## 2017-12-20 HISTORY — DX: Type 2 diabetes mellitus without complications: E11.9

## 2017-12-20 LAB — POCT I-STAT CREATININE: CREATININE: 0.7 mg/dL (ref 0.44–1.00)

## 2017-12-20 MED ORDER — IOPAMIDOL (ISOVUE-300) INJECTION 61%
100.0000 mL | Freq: Once | INTRAVENOUS | Status: AC | PRN
  Administered 2017-12-20: 100 mL via INTRAVENOUS

## 2017-12-20 NOTE — Telephone Encounter (Signed)
Patient was scheduled for a CT today. Called the CT department & asked to speak with the patient & interpreter as patient has not returned calls regarding surgery for sling cystourethropexy with Dr Matilde Sprang. With assistance from interpreter, advised patient of surgery scheduled 02/06/2018, [facility] teaching appointment 01/27/2018, post op appointment 02/20/2017 & that pre-admission testing will call patient to make appointment with anesthesia nurse. Patient expressed understanding of instructions.

## 2017-12-22 ENCOUNTER — Other Ambulatory Visit: Payer: Self-pay | Admitting: Radiology

## 2017-12-26 NOTE — Telephone Encounter (Signed)
With help from [place] #[id number] with [employer], confirmed pre-admission testing appointment on 01/26/2018 at 8:00 with patient. Questions answered. Patient expresses understanding of conversation.

## 2018-01-11 ENCOUNTER — Encounter: Payer: Self-pay | Admitting: Nurse Practitioner

## 2018-01-11 ENCOUNTER — Other Ambulatory Visit: Payer: Self-pay

## 2018-01-11 ENCOUNTER — Ambulatory Visit: Payer: Medicare Other | Attending: Nurse Practitioner | Admitting: Nurse Practitioner

## 2018-01-11 VITALS — BP 152/92 | HR 81 | Temp 99.1°F | Resp 18 | Ht 65.0 in | Wt 154.0 lb

## 2018-01-11 DIAGNOSIS — Z9889 Other specified postprocedural states: Secondary | ICD-10-CM | POA: Insufficient documentation

## 2018-01-11 DIAGNOSIS — G894 Chronic pain syndrome: Secondary | ICD-10-CM

## 2018-01-11 DIAGNOSIS — E119 Type 2 diabetes mellitus without complications: Secondary | ICD-10-CM | POA: Diagnosis not present

## 2018-01-11 DIAGNOSIS — Z5181 Encounter for therapeutic drug level monitoring: Secondary | ICD-10-CM | POA: Diagnosis present

## 2018-01-11 DIAGNOSIS — Z86010 Personal history of colonic polyps: Secondary | ICD-10-CM | POA: Insufficient documentation

## 2018-01-11 DIAGNOSIS — F418 Other specified anxiety disorders: Secondary | ICD-10-CM | POA: Insufficient documentation

## 2018-01-11 DIAGNOSIS — I10 Essential (primary) hypertension: Secondary | ICD-10-CM | POA: Diagnosis not present

## 2018-01-11 DIAGNOSIS — M79601 Pain in right arm: Secondary | ICD-10-CM

## 2018-01-11 DIAGNOSIS — Z79891 Long term (current) use of opiate analgesic: Secondary | ICD-10-CM | POA: Insufficient documentation

## 2018-01-11 DIAGNOSIS — K219 Gastro-esophageal reflux disease without esophagitis: Secondary | ICD-10-CM | POA: Insufficient documentation

## 2018-01-11 DIAGNOSIS — Z885 Allergy status to narcotic agent status: Secondary | ICD-10-CM | POA: Diagnosis not present

## 2018-01-11 DIAGNOSIS — M545 Low back pain: Secondary | ICD-10-CM | POA: Diagnosis present

## 2018-01-11 DIAGNOSIS — M4722 Other spondylosis with radiculopathy, cervical region: Secondary | ICD-10-CM | POA: Insufficient documentation

## 2018-01-11 DIAGNOSIS — G4486 Cervicogenic headache: Secondary | ICD-10-CM

## 2018-01-11 DIAGNOSIS — Z9049 Acquired absence of other specified parts of digestive tract: Secondary | ICD-10-CM | POA: Insufficient documentation

## 2018-01-11 DIAGNOSIS — E559 Vitamin D deficiency, unspecified: Secondary | ICD-10-CM | POA: Insufficient documentation

## 2018-01-11 DIAGNOSIS — Z79899 Other long term (current) drug therapy: Secondary | ICD-10-CM | POA: Insufficient documentation

## 2018-01-11 DIAGNOSIS — Z8249 Family history of ischemic heart disease and other diseases of the circulatory system: Secondary | ICD-10-CM | POA: Insufficient documentation

## 2018-01-11 DIAGNOSIS — G8929 Other chronic pain: Secondary | ICD-10-CM

## 2018-01-11 DIAGNOSIS — R32 Unspecified urinary incontinence: Secondary | ICD-10-CM | POA: Insufficient documentation

## 2018-01-11 DIAGNOSIS — M542 Cervicalgia: Secondary | ICD-10-CM | POA: Diagnosis present

## 2018-01-11 DIAGNOSIS — R51 Headache: Secondary | ICD-10-CM | POA: Diagnosis not present

## 2018-01-11 DIAGNOSIS — M79602 Pain in left arm: Secondary | ICD-10-CM

## 2018-01-11 DIAGNOSIS — M25511 Pain in right shoulder: Secondary | ICD-10-CM | POA: Diagnosis not present

## 2018-01-11 MED ORDER — TRAMADOL HCL 50 MG PO TABS: 50.0000 mg | ORAL_TABLET | Freq: Four times a day (QID) | ORAL | 0 refills | Status: DC | PRN

## 2018-01-11 MED ORDER — CYCLOBENZAPRINE HCL 10 MG PO TABS: 10.0000 mg | ORAL_TABLET | Freq: Three times a day (TID) | ORAL | 5 refills | Status: DC | PRN

## 2018-01-11 MED ORDER — TRAMADOL HCL 50 MG PO TABS: 50.0000 mg | ORAL_TABLET | Freq: Four times a day (QID) | ORAL | 2 refills | Status: DC | PRN

## 2018-01-11 NOTE — Patient Instructions (Addendum)
____________________________________________________________________________________________  Medication Rules  Purpose: To inform patients, and their family members, of our rules and regulations.  Applies to: All patients receiving prescriptions (written or electronic).  Pharmacy of record: Pharmacy where electronic prescriptions will be sent. If written prescriptions are taken to a different pharmacy, please inform the nursing staff. The pharmacy listed in the electronic medical record should be the one where you would like electronic prescriptions to be sent.  Electronic prescriptions: In compliance with the [place] Strengthen Opioid Misuse Prevention (STOP) Act of 2017 (Session Law 2017-74/H243), effective February 08, 2018, all controlled substances must be electronically prescribed. Calling prescriptions to the pharmacy will cease to exist.  Prescription refills: Only during scheduled appointments. Applies to all prescriptions.  NOTE: The following applies primarily to controlled substances (Opioid* Pain Medications).   Patient's responsibilities: 1. Pain Pills: Bring all pain pills to every appointment (except for procedure appointments). 2. Pill Bottles: Bring pills in original pharmacy bottle. Always bring the newest bottle. Bring bottle, even if empty. 3. Medication refills: You are responsible for knowing and keeping track of what medications you take and those you need refilled. The day before your appointment: write a list of all prescriptions that need to be refilled. The day of the appointment: give the list to the admitting nurse. Prescriptions will be written only during appointments. If you forget a medication: it will not be "Called in", "Faxed", or "electronically sent". You will need to get another appointment to get these prescribed. No early refills. Do not call asking to have your prescription filled early. 4. Prescription Accuracy: You are responsible for  carefully inspecting your prescriptions before leaving our office. Have the discharge nurse carefully go over each prescription with you, before taking them home. Make sure that your name is accurately spelled, that your address is correct. Check the name and dose of your medication to make sure it is accurate. Check the number of pills, and the written instructions to make sure they are clear and accurate. Make sure that you are given enough medication to last until your next medication refill appointment. 5. Taking Medication: Take medication as prescribed. When it comes to controlled substances, taking less pills or less frequently than prescribed is permitted and encouraged. Never take more pills than instructed. Never take medication more frequently than prescribed.  6. Inform other Doctors: Always inform, all of your healthcare providers, of all the medications you take. 7. Pain Medication from other Providers: You are not allowed to accept any additional pain medication from any other Doctor or Healthcare provider. There are two exceptions to this rule. (see below) In the event that you require additional pain medication, you are responsible for notifying us, as stated below. 8. Medication Agreement: You are responsible for carefully reading and following our Medication Agreement. This must be signed before receiving any prescriptions from our practice. Safely store a copy of your signed Agreement. Violations to the Agreement will result in no further prescriptions. (Additional copies of our Medication Agreement are available upon request.) 9. Laws, Rules, & Regulations: All patients are expected to follow all Federal and State Laws, Statutes, Rules, & Regulations. Ignorance of the Laws does not constitute a valid excuse. The use of any illegal substances is prohibited. 10. Adopted CDC guidelines & recommendations: Target dosing levels will be at or below 60 MME/day. Use of benzodiazepines** is not  recommended.  Exceptions: There are only two exceptions to the rule of not receiving pain medications from other Healthcare Providers. 1.   Exception #1 (Emergencies): In the event of an emergency (i.e.: accident requiring emergency care), you are allowed to receive additional pain medication. However, you are responsible for: As soon as you are able, call our office [phone], at any time of the day or night, and leave a message stating your name, the date and nature of the emergency, and the name and dose of the medication prescribed. In the event that your call is answered by a member of our staff, make sure to document and save the date, time, and the name of the person that took your information.  2. Exception #2 (Planned Surgery): In the event that you are scheduled by another doctor or dentist to have any type of surgery or procedure, you are allowed (for a period no longer than 30 days), to receive additional pain medication, for the acute post-op pain. However, in this case, you are responsible for picking up a copy of our "Post-op Pain Management for Surgeons" handout, and giving it to your surgeon or dentist. This document is available at our office, and does not require an appointment to obtain it. Simply go to our office during business hours (Monday-Thursday from 8:00 AM to 4:00 PM) (Friday 8:00 AM to 12:00 Noon) or if you have a scheduled appointment with [place], prior to your surgery, and ask for it by name. In addition, you will need to provide [place] with your name, name of your surgeon, type of surgery, and date of procedure or surgery.  *Opioid medications include: morphine, codeine, oxycodone, oxymorphone, hydrocodone, hydromorphone, meperidine, tramadol, tapentadol, buprenorphine, fentanyl, methadone. **Benzodiazepine medications include: diazepam (Valium), alprazolam (Xanax), clonazepam (Klonopine), lorazepam (Ativan), clorazepate (Tranxene), chlordiazepoxide (Librium), estazolam (Prosom),  oxazepam (Serax), temazepam (Restoril), triazolam (Halcion) (Last updated: 04/07/2017) ____________________________________________________________________________________________    Tramadol 50 mg 1 tab every 6 hours prn pain qty 90 refills x2 given to patient. '  Flexeril 10 mg 1 tab po tid qty 30 refills x 5 escribed to pharmacy

## 2018-01-11 NOTE — Progress Notes (Signed)
Pt brought bottle from 02/24/2017 with 3/90 pills matching description of Tramadol. Pt advised she must bring original container from most recent perscription fill to each appt. Pt. states the last time she took Tramadol was yesterday.

## 2018-01-11 NOTE — Progress Notes (Signed)
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 chlordiazepoxide (Librium), estazolam (Prosom), oxazepam (Serax), temazepam (Restoril), triazolam (Halcion) (Last updated: 04/07/2017) ____________________________________________________________________________________________    Tramadol 50 mg 1 tab every 6 hours prn pain qty 90 refills x2 given to patient. '  Flexeril 10 mg 1 tab po tid qty 30 refills x 5 escribed to pharmacy

## 2018-01-26 ENCOUNTER — Encounter
Admission: RE | Admit: 2018-01-26 | Discharge: 2018-01-26 | Disposition: A | Discharge status: Home / Self Care | Payer: Medicare Other | Source: Ambulatory Visit | Attending: Urology | Admitting: Urology

## 2018-01-26 ENCOUNTER — Other Ambulatory Visit: Payer: Self-pay

## 2018-01-26 DIAGNOSIS — Z01810 Encounter for preprocedural cardiovascular examination: Secondary | ICD-10-CM | POA: Insufficient documentation

## 2018-01-26 DIAGNOSIS — I10 Essential (primary) hypertension: Secondary | ICD-10-CM

## 2018-01-26 HISTORY — DX: Depression, unspecified: F32.A

## 2018-01-26 HISTORY — DX: Major depressive disorder, single episode, unspecified: F32.9

## 2018-01-26 NOTE — Pre-Procedure Instructions (Signed)
Sent message to Amy Heigel RNabout need for H&P for upcoming surgery

## 2018-01-26 NOTE — Patient Instructions (Signed)
Your procedure is scheduled on:Mon. 12/30 Su procedimiento est programado para: Report to Day Surgery. Presntese a: To find out your arrival time please call [phone] between 1PM - 3PM on Thurs.12/27 Para saber su hora de llegada por favor llame al [phone] entre la 1PM - 3PM el da:   Remember: Instructions that are not followed completely may result in serious medical risk, up to and including death,  or upon the discretion of your surgeon and anesthesiologist your surgery may need to be rescheduled.  Recuerde: Las instrucciones que no se siguen completamente Heritage manager en un riesgo de salud grave, incluyendo hasta  la [place] o a discrecin de su cirujano y Environmental health practitioner, su ciruga se puede posponer.   __X_ 1.Do not eat food after midnight the night before your procedure. No    gum chewing or hard candies. You may drink clear liquids up to 2 hours     before you are scheduled to arrive for your surgery- DO not drink clear     Liquids within 2 hours of the start of your surgery.     Clear Liquids include:    water,    No coma nada despus de la medianoche de la noche anterior a su    procedimiento. No coma chicles ni caramelos duros. Puede tomar    lquidos claros hasta 2 horas antes de su hora programada de llegada al     hospital para su procedimiento. No tome lquidos claros durante el     transcurso de las 2 horas de su llegada programada al hospital para su     procedimiento, ya que esto puede llevar a que su procedimiento se    retrase o tenga que volver a Health and safety inspector.  Los lquidos claros incluyen:          - Agua o jugo de [place] sin pulpa                       _X__ 2.Do Not Smoke or use e-cigarettes For 24 Hours Prior to Your Surgery.    Do not use any chewable tobacco products for at least 6   hours prior to surgery.    No fume ni use cigarrillos electrnicos durante las 24 horas previas    a su [place].  No use ningn producto de tabaco masticable  durante   al menos 6 horas antes de la [place].     __X_ 3. No alcohol for 24 hours before or after surgery.    No tome alcohol durante las 24 horas antes ni despus de la [place].   ____4. Bring all medications with you on the day of surgery if instructed.    Lleve todos los medicamentos con usted el da de su ciruga si se le    ha indicado as.   __x__ 5. Notify your doctor if there is any change in your medical condition (cold,fever, infections).    Informe a su mdico si hay algn cambio en su condicin mdica  (resfriado, fiebre, infecciones).   Do not wear jewelry, make-up, hairpins, clips or nail polish.  No use joyas, maquillajes, pinzas/ganchos para el cabello ni esmalte de uas.  Do not wear lotions, powders, or perfumes. You may wear deodorant.  No use lociones, polvos o perfumes.  Puede usar desodorante.    Do not shave 48 hours prior to surgery. Men may shave face and neck.  No se afeite 48 horas antes de la [place].  Los [employer]  afeitarse la cara  y el cuello.   Do not bring valuables to the hospital.   No lleve objetos [facility] is not responsible for any belongings or valuables.  [facility] no se hace responsable de ningn tipo de pertenencias u objetos de Geographical information systems officer.               Contacts, dentures or bridgework may not be worn into surgery.  Los lentes de [place], las dentaduras postizas o puentes no se pueden usar en la [place].   Leave your suitcase in the car. After surgery it may be brought to your room.  Deje su maleta en el auto.  Despus de la ciruga podr traerla a su habitacin.   For patients admitted to the hospital, discharge time is determined by your  treatment team.  Para los pacientes que sean ingresados al hospital, el tiempo en el cual se le  dar de alta es determinado por su equipo de [place].   Patients discharged the day of surgery will not be allowed to drive home. A los pacientes que se les da de alta  el mismo da de la ciruga no se les permitir conducir a Holiday representative.   Please read over the following fact sheets that you were given: Por favor [place] informacin que le dieron:      _x___ Take these medicines the morning of surgery with A SIP OF WATER:          [employer] medicinas la maana de la ciruga con UN SORBO DE AGUA:  1. cyclobenzaprine (FLEXERIL) 10 MG tablet if needed  2. omeprazole (PRILOSEC) 20 MG capsule  3. sertraline (ZOLOFT) 25 MG tablet  4.  traMADol (ULTRAM) 50 MG tablet if needed     5.  6.  ____ Fleet Enema (as directed)          Enema de Fleet (segn lo indicado)    ____ Use CHG Soap as directed          Utilice el jabn de CHG segn lo indicado  ____ Use inhalers on the day of surgery          Use los inhaladores el da de la ciruga  _x___ Stop metformin 2 days prior to surgery last dose on 12/27          Deje de tomar el metformin 2 das antes de la ciruga    ____ Take 1/2 of usual insulin dose the night before surgery and none on the morning of surgery           Tome la mitad de la dosis habitual de insulina la noche antes de la [place] y no tome nada en la maana de la             ciruga  ____ Stop Coumadin/Plavix/aspirin           Deje de tomar el Coumadin/Plavix/aspirina el da:  _x___ Stop Anti-inflammatories meloxicam (MOBIC) 15 MG tablet  On 12/23          Deje de tomar antiinflamatorios el da:   ____ Stop supplements until after surgery            Deje de tomar suplementos hasta despus de la ciruga  ____ Bring C-Pap to the hospital          [place] al hospital

## 2018-01-27 ENCOUNTER — Encounter: Payer: Self-pay | Admitting: Urology

## 2018-01-27 ENCOUNTER — Ambulatory Visit (INDEPENDENT_AMBULATORY_CARE_PROVIDER_SITE_OTHER): Payer: Medicare Other | Admitting: Urology

## 2018-01-27 VITALS — BP 155/78 | HR 82 | Ht 65.0 in | Wt 157.3 lb

## 2018-01-27 DIAGNOSIS — N393 Stress incontinence (female) (male): Secondary | ICD-10-CM

## 2018-01-27 NOTE — Pre-Procedure Instructions (Signed)
MD will do H&P the AM of surgery

## 2018-01-27 NOTE — Progress Notes (Signed)
01/27/2018 11:07 AM   [name] 16-Apr-1960 [id number]  Referring provider: [facility], [place], [place] [address]  Chief Complaint  Patient presents with   Urinary Frequency    HPI: Alyssa Wood is a 57 yo F with a history of mixed urinary incontinence returns today for CIC teaching prior to PUBO-vaginal sling surgery. Her last visit with [place] was on 11/28/2017  With Bjorn Loser, MD. She is accompanied by a interpreter.    Today, she c/o of frequent urination, difficulties with postponing urination, burning urination (sometimes), nocturia, leakage of urine, intermittent stream, urinary hesitancy (sometimes), painful intercourse and weak stream (sometimes).   Dr. Matilde Sprang wants her to learn how to catheter herself prior to surgery in case she cannot urinate by herself after the surgery.   PMH: Past Medical History:  Diagnosis Date   Anxiety    Chronic pain    Depression    Diabetes mellitus without complication (HCC)    GERD (gastroesophageal reflux disease)    Hypertension     Surgical History: Past Surgical History:  Procedure Laterality Date   CHOLECYSTECTOMY  1992   COLONOSCOPY WITH PROPOFOL N/A 08/28/2015   Procedure: COLONOSCOPY WITH PROPOFOL;  Surgeon: [name], MD;  Location: [facility];  Service: Endoscopy;  Laterality: N/A;  PER PT PLEASE LEAVE LAST NEEDS INTERPRETER   POLYPECTOMY  08/28/2015   Procedure: POLYPECTOMY;  Surgeon: [name], MD;  Location: [facility];  Service: Endoscopy;;   SHOULDER SURGERY Right    SPINAL CORD STIMULATOR IMPLANT Right Jan 2013   WRIST SURGERY Right    right hand and wrist reconstruction    Home Medications:  Allergies as of 01/27/2018      Reactions   Trazodone And Nefazodone Anaphylaxis   Hydrocodone Nausea And Vomiting      Medication List       Accurate as of January 27, 2018 11:07 AM. Always use  your most recent med list.        Calcium Carbonate-Vitamin D 600-400 MG-UNIT tablet Take 1 tablet by mouth 2 (two) times daily.   cyclobenzaprine 10 MG tablet Commonly known as:  FLEXERIL Take 1 tablet (10 mg total) by mouth 3 (three) times daily as needed for muscle spasms.   lisinopril-hydrochlorothiazide 20-12.5 MG tablet Commonly known as:  PRINZIDE,ZESTORETIC Take 1 tablet by mouth daily.   meloxicam 15 MG tablet Commonly known as:  MOBIC Take 15 mg by mouth daily.   metFORMIN 500 MG tablet Commonly known as:  GLUCOPHAGE Take 500 mg by mouth 2 (two) times daily with a meal.   omeprazole 20 MG capsule Commonly known as:  PRILOSEC Take 20 mg by mouth 2 (two) times daily.   sertraline 25 MG tablet Commonly known as:  ZOLOFT Take 25 mg by mouth daily.   traMADol 50 MG tablet Commonly known as:  ULTRAM Take 1 tablet (50 mg total) by mouth every 6 (six) hours as needed for severe pain.   VITAMIN B 12 PO Take 1 tablet by mouth daily.       Allergies:  Allergies  Allergen Reactions   Trazodone And Nefazodone Anaphylaxis   Hydrocodone Nausea And Vomiting    Family History: Family History  Problem Relation Age of Onset   Cancer Father    Heart disease Father     Social History:  reports that she has never smoked. She has never used smokeless tobacco. She reports that she does not drink alcohol  or use drugs.  ROS: UROLOGY Frequent Urination?: Yes Hard to postpone urination?: Yes Burning/pain with urination?: Yes Get up at night to urinate?: Yes Leakage of urine?: Yes Urine stream starts and stops?: Yes Trouble starting stream?: Yes Do you have to strain to urinate?: Yes Blood in urine?: No Urinary tract infection?: No Sexually transmitted disease?: No Injury to kidneys or bladder?: No Painful intercourse?: Yes Weak stream?: No Currently pregnant?: No Vaginal bleeding?: No Last menstrual period?: n  Gastrointestinal Nausea?: No Vomiting?:  No Indigestion/heartburn?: No Diarrhea?: No Constipation?: No  Constitutional Fever: No Night sweats?: No Weight loss?: No Fatigue?: Yes  Skin Skin rash/lesions?: No Itching?: No  Eyes Blurred vision?: Yes Double vision?: No  Ears/Nose/Throat Sore throat?: No Sinus problems?: No  Hematologic/Lymphatic Swollen glands?: No Easy bruising?: No  Cardiovascular Leg swelling?: No Chest pain?: No  Respiratory Cough?: No Shortness of breath?: No  Endocrine Excessive thirst?: Yes  Musculoskeletal Back pain?: No Joint pain?: No  Neurological Headaches?: No Dizziness?: No  Psychologic Depression?: Yes Anxiety?: No  Physical Exam: BP (!) 155/78 (BP Location: Left Arm, Patient Position: Sitting, Cuff Size: Large)    Pulse 82    Ht 5\' 5"  (1.651 m)    Wt 157 lb 4.8 oz (71.4 kg)    BMI 26.18 kg/m   Constitutional:  Well nourished. Alert and oriented, No acute distress. HEENT: [place] AT, moist mucus membranes.  Trachea midline, no masses. Cardiovascular: No clubbing, cyanosis, or edema. Respiratory: Normal respiratory effort, no increased work of breathing. GU: No CVA tenderness.  No bladder fullness or masses.  Normal external genitalia, normal pubic hair distribution, no lesions.  Normal urethral meatus, no lesions, no prolapse, no discharge.   No urethral masses, tenderness and/or tenderness.  Skin: No rashes, bruises or suspicious lesions. Neurologic: Grossly intact, no focal deficits, moving all 4 extremities. Psychiatric: Normal mood and affect.   Continuous Intermittent Catheterization Due to upcoming sling procedure, patient is present today for a teaching of self I & O Catheterization. Patient was given detailed verbal and printed instructions of self catheterization. Patient was cleaned and prepped in a sterile fashion.  With instruction and assistance patient inserted a 14 FR and urine return was noted 50 ml, urine was yellow in color. Patient tolerated well, no  complications were noted Patient was given a sample bag with supplies to take home.  Instructions were given per Zara Council, PA-C for patient to cath if unable to void and/or she experiences suprapubic discomfort.    Preformed by: Zara Council, PA-C    Assessment & Plan:    1. Mixed Incontinence  - Demonstrated CIC to pt in case she cannot urinate independently after surgery; urinary catheter supplies were given to patient  -patient demonstrated proficiency in self technique   Return for return for surgery .  Zara Council, PA-C  [facility] [address] [place], [place] [address] [phone]  I, Lucas Mallow, am acting as a Education administrator for [employer],  I have reviewed the above documentation for accuracy and completeness, and I agree with the above.    Zara Council, PA-C

## 2018-02-03 NOTE — H&P (Signed)
HPI: The patient leaks with coughing sneezing bending and lifting. She can leakwith walking. She has urge incontinence and both are significant. She wears 2 pads per day which are damp. She denies bedwetting. She failed oxybutynin and the Myrbetriq  On pelvic examination the patient had mild grade 2 hyper mobility of the bladder neck and a negative cough test. She had a grade 1 cystocele. She had a suburethral swelling that may have been a little bit tender though her tissues may have been a little bit dry. It was underneath and on both sides of the urethra. She had a grade 1 rectocele.  Patient has milder mixed incontinence refractory to medications. The role of urodynamics discussed.My index of suspicion is lower that she has a diverticulum. Having said that even if the urodynamics did not see when she would need an MRI if she ever went on to have a sling. Think the patient has a chronic pain stimulator. She cannot have an MRI. I would therefore need to order a CT scan and speak to radiology prior.   On urodynamics the patient was catheterized for 75 mL. Maximum bladder capacity was 300 mL. Patient had sensory urgency. Bladder was unstable reaching a pressure of 6 cm of water. She had to void off the contraction. She did not leak with a Valsalva pressure of 83 cm of water. During voluntary voiding the patient voided 146 mL with a maximum flow of 15 mils per second. Maximum voiding pressure was 28 cm of water. She emptied efficiently. EMG activity increased during voiding. Bladder neck descended 2 cm. There was some bulging of bladder neck and no evidence of a diverticulum.   Patient does report she can leak a small amount or larger amount when she coughs or sneezes and she leaks with walking. She still has urge incontinence. Physical therapy was helping initially but she does not think it is currently helping her and is ongoingand may stop it  In 6 weeks if she  still is leaking on the Toviaz 8 mg I will draw a picture and talk about a sling cystourethropexy. If she decides to have surgery I will order a pelvic CT scan with contrast looking for the presence of a small diverticulum. Recognizing its sensitivity if negative I think we are safe to proceed with a sling. I think a sling would be better than a refractory therapy. Persisting or worsening OAB symptoms would need to be highlighted  Today Patient was not helped by the [place].  She had mild relief with Myrbetriq.  I spent approximately 30 minutes with the patient counseling them regarding the sling with the translator.  Picture was drawn.  I went through the sling in complete detail.  She understands her overactive bladder symptoms could persist in approximately 50% of cases.  I think the leaking with walking which is a primary symptom is from her stress incontinence.  Clinically not infected today.  She understands that I will get a CT scan of the pelvis with contrast recognizing sensitivity to rule out a urethral diverticulum.  She would like to have a sling in January.  She is very much bothered by the symptoms.  Clinically not infected today and frequency stable  PMH:     Past Medical History:  Diagnosis Date  . Anxiety   . Chronic pain   . GERD (gastroesophageal reflux disease)   . Hypertension     Surgical History:      Past Surgical History:  Procedure Laterality  Date  . CHOLECYSTECTOMY  1992  . COLONOSCOPY WITH PROPOFOL N/A 08/28/2015   Procedure: COLONOSCOPY WITH PROPOFOL;  Surgeon: [name], MD;  Location: [facility];  Service: Endoscopy;  Laterality: N/A;  PER PT PLEASE LEAVE LAST NEEDS INTERPRETER  . POLYPECTOMY  08/28/2015   Procedure: POLYPECTOMY;  Surgeon: [name], MD;  Location: [facility];  Service: Endoscopy;;  . SHOULDER SURGERY Right   . SPINAL CORD STIMULATOR IMPLANT Right Jan 2013  . WRIST SURGERY Right    right hand and  wrist reconstruction    Home Medications:       Allergies as of 11/28/2017      Reactions   Trazodone And Nefazodone Anaphylaxis   Hydrocodone Nausea And Vomiting               Medication List            Accurate as of 11/28/17  8:43 AM. Always use your most recent med list.           calcium-vitamin D 500-200 MG-UNIT tablet Commonly known as:  OSCAL WITH D Take 1 tablet by mouth.   cyclobenzaprine 10 MG tablet Commonly known as:  FLEXERIL Take 10 mg by mouth 3 (three) times daily as needed for muscle spasms.   fesoterodine 8 MG Tb24 tablet Commonly known as:  TOVIAZ Take 1 tablet (8 mg total) by mouth daily.   lisinopril-hydrochlorothiazide 20-12.5 MG tablet Commonly known as:  PRINZIDE,ZESTORETIC Take 1 tablet by mouth daily.   meloxicam 15 MG tablet Commonly known as:  MOBIC Take 1 tablet (15 mg total) by mouth daily.   omeprazole 20 MG capsule Commonly known as:  PRILOSEC Take 20 mg by mouth 2 (two) times daily as needed.   sertraline 50 MG tablet Commonly known as:  ZOLOFT Take 50 mg by mouth daily.   traMADol 50 MG tablet Commonly known as:  ULTRAM Take 1 tablet (50 mg total) by mouth every 6 (six) hours as needed for severe pain.   VITAMIN B 12 PO Take 1 tablet by mouth daily.       Allergies:      Allergies  Allergen Reactions  . Trazodone And Nefazodone Anaphylaxis  . Hydrocodone Nausea And Vomiting    Family History:      Family History  Problem Relation Age of Onset  . Cancer Father   . Heart disease Father     Social History:  reports that she has never smoked. She has never used smokeless tobacco. She reports that she does not drink alcohol or use drugs.  ROS: UROLOGY Frequent Urination?: Yes Hard to postpone urination?: Yes Burning/pain with urination?: Yes Get up at night to urinate?: Yes Leakage of urine?: Yes Urine stream starts and stops?: Yes Trouble starting stream?: Yes Do you  have to strain to urinate?: Yes Blood in urine?: No Urinary tract infection?: No Sexually transmitted disease?: No Injury to kidneys or bladder?: No Painful intercourse?: No Weak stream?: No Currently pregnant?: No Vaginal bleeding?: No Last menstrual period?: n  Gastrointestinal Nausea?: No Indigestion/heartburn?: No Diarrhea?: No Constipation?: No  Constitutional Fever: No Night sweats?: No Weight loss?: No  Skin Skin rash/lesions?: No Itching?: No  Eyes Blurred vision?: No Double vision?: No  Ears/Nose/Throat Sore throat?: No Sinus problems?: No  Hematologic/Lymphatic Swollen glands?: No Easy bruising?: No  Cardiovascular Chest pain?: No  Respiratory Cough?: No Shortness of breath?: No  Endocrine Excessive thirst?: No  Musculoskeletal Back pain?: No Joint pain?: No  Neurological Headaches?: No Dizziness?: No  Psychologic Depression?: Yes Anxiety?: No  Physical Exam: There were no vitals taken for this visit.  Constitutional:  Alert and oriented, No acute distress.   Laboratory Data: RecentLabs       Lab Results  Component Value Date   WBC 7.9 12/12/2016   HGB 13.4 12/12/2016   HCT 40.0 12/12/2016   MCV 83.5 12/12/2016   PLT 242 12/12/2016      RecentLabs       Lab Results  Component Value Date   CREATININE 0.62 01/11/2017      RecentLabs  No results found for: PSA    RecentLabs  No results found for: TESTOSTERONE    RecentLabs  No results found for: HGBA1C    Urinalysis Labs(Brief)          Component Value Date/Time   APPEARANCEUR Clear 06/02/2017 1337   GLUCOSEU Negative 06/02/2017 1337   BILIRUBINUR Negative 06/02/2017 1337   PROTEINUR Negative 06/02/2017 1337   NITRITE Negative 06/02/2017 1337   LEUKOCYTESUR Trace (A) 06/02/2017 1337      Pertinent Imaging:   Assessment & Plan: The patient will have a CT scan.  We will schedule a sling in January.  I  will call if the result is abnormal.  I use my usual complete template  After a thorough review of the management options for the patient's condition the patient  elected to proceed with surgical therapy as noted above. We have discussed the potential benefits and risks of the procedure, side effects of the proposed treatment, the likelihood of the patient achieving the goals of the procedure, and any potential problems that might occur during the procedure or recuperation. Informed consent has been obtained.

## 2018-02-05 MED ORDER — CIPROFLOXACIN IN D5W 400 MG/200ML IV SOLN: 400.0000 mg | INTRAVENOUS | Status: DC

## 2018-02-05 MED ORDER — CLINDAMYCIN PHOSPHATE 600 MG/50ML IV SOLN: 600.0000 mg | INTRAVENOUS | Status: DC

## 2018-02-06 ENCOUNTER — Ambulatory Visit
Admission: RE | Admit: 2018-02-06 | Discharge: 2018-02-06 | Disposition: A | Discharge status: Home / Self Care | Payer: Medicare Other | Attending: Urology | Admitting: Urology

## 2018-02-06 DIAGNOSIS — N393 Stress incontinence (female) (male): Secondary | ICD-10-CM

## 2018-02-06 NOTE — Interval H&P Note (Signed)
History and Physical Interval Note:  02/06/2018 10:03 AM  [name]  has presented today for surgery, with the diagnosis of stress incontinence  The various methods of treatment have been discussed with the patient and family. After consideration of risks, benefits and other options for treatment, the patient has consented to  Procedure(s): PUBO-VAGINAL SLING (N/A) as a surgical intervention .  The patient's history has been reviewed, patient examined, no change in status, stable for surgery.  I have reviewed the patient's chart and labs.  Questions were answered to the patient's satisfaction.     Riggin Cuttino A Mike Hamre

## 2018-02-14 ENCOUNTER — Telehealth: Payer: Self-pay | Admitting: Urology

## 2018-02-14 NOTE — Telephone Encounter (Signed)
Pt LMOM wanting a return call to discuss surgery.  Please call pt.

## 2018-02-15 NOTE — Telephone Encounter (Signed)
Using Spanish interpreter Arnette Norris [phone], discussed the [facility] Surgery Information form below with patient over the phone.   [place], [place], [place] [address] Telephone: [phone] Fax: [phone]   Thank you for choosing [facility] for your upcoming surgery!  We are always here to assist in your urological needs.  Please read the following information with specific details for your upcoming appointments related to your surgery. Please contact Ashawna Hanback at [phone] Option 3 with any questions.  The Name of Your Surgery: cystoscopy, sling cystourethropexy Your Surgery Date: 03/20/2018 Your Surgeon: Nicki Reaper MacDiarmid  Please call Same Day Surgery at [phone] between the hours of 1pm-3pm one day prior to your surgery. They will inform you of the time to arrive at Same Day Surgery which is located on the second floor of the [facility] [address]  You will receive a call from the [facility] office regarding your appointment with them.  The Pre-Admission Testing office is located at [place], on the first floor of the [facility] at [facility] in [place] (office is to the right as you enter through the [employer] of the [facility]). Please have all medications you are currently taking and your insurance card available.   Patient was advised to have nothing to eat or drink after midnight the night prior to surgery except that she may have only water until 2 hours before surgery with nothing to drink within 2 hours of surgery.  The patient states she currently takes no blood thinners. Patient's questions were answered and she expressed understanding of these instructions.

## 2018-02-20 ENCOUNTER — Ambulatory Visit: Payer: Medicare Other | Admitting: Urology

## 2018-03-17 ENCOUNTER — Telehealth: Payer: Self-pay | Admitting: Urology

## 2018-03-17 NOTE — Telephone Encounter (Signed)
Called to get a rescheduled date for surgery and patient stated she will call back and reschedule when she is feeling better.

## 2018-03-20 ENCOUNTER — Ambulatory Visit: Admit: 2018-03-20 | Payer: Medicare Other | Admitting: Urology

## 2018-03-20 SURGERY — CREATION, PUBOVAGINAL SLING
Anesthesia: Choice

## 2018-03-27 ENCOUNTER — Other Ambulatory Visit: Payer: Self-pay | Admitting: Radiology

## 2018-04-03 ENCOUNTER — Ambulatory Visit: Payer: Medicare Other | Admitting: Urology

## 2018-04-11 ENCOUNTER — Other Ambulatory Visit: Payer: Self-pay

## 2018-04-11 ENCOUNTER — Encounter: Payer: Self-pay | Admitting: Nurse Practitioner

## 2018-04-11 ENCOUNTER — Ambulatory Visit: Payer: Medicare Other | Attending: Nurse Practitioner | Admitting: Nurse Practitioner

## 2018-04-11 VITALS — BP 148/77 | HR 89 | Temp 98.4°F | Resp 16 | Ht 64.0 in | Wt 151.0 lb

## 2018-04-11 DIAGNOSIS — M7918 Myalgia, other site: Secondary | ICD-10-CM

## 2018-04-11 DIAGNOSIS — G8929 Other chronic pain: Secondary | ICD-10-CM

## 2018-04-11 DIAGNOSIS — M4722 Other spondylosis with radiculopathy, cervical region: Secondary | ICD-10-CM

## 2018-04-11 DIAGNOSIS — M5412 Radiculopathy, cervical region: Secondary | ICD-10-CM | POA: Diagnosis present

## 2018-04-11 DIAGNOSIS — Z79891 Long term (current) use of opiate analgesic: Secondary | ICD-10-CM

## 2018-04-11 DIAGNOSIS — G894 Chronic pain syndrome: Secondary | ICD-10-CM | POA: Diagnosis present

## 2018-04-11 MED ORDER — MELOXICAM 15 MG PO TABS: 15.0000 mg | ORAL_TABLET | Freq: Every day | ORAL | 5 refills | Status: DC

## 2018-04-11 MED ORDER — TRAMADOL HCL 50 MG PO TABS: 50.0000 mg | ORAL_TABLET | Freq: Three times a day (TID) | ORAL | 5 refills | Status: DC | PRN

## 2018-04-11 NOTE — Progress Notes (Signed)
Patient's Name: Alyssa Wood  MRN: [id number]  Referring Provider: [facility]  DOB: 01-13-1961  PCP: [facility], [place]: 04/11/2018  Note by: Dionisio David, NP  Service setting: Ambulatory outpatient  Specialty: Interventional Pain Management  Location: [facility] (AMB) Pain Management Facility    Patient type: Established   HPI  Reason for Visit: Alyssa Wood is a 58 y.o. year old, female patient, who comes today with a chief complaint of Shoulder Pain (right) Last Appointment: Her last appointment at our practice was on 01/11/2018. I last saw her on 01/11/2018.  Pain Assessment: Today, Alyssa Wood describes the severity of the Chronic pain as a 3 /10. She indicates the location/referral of the pain to be Shoulder Right/down right arm to fingers. Onset was:  [name] The quality of pain is described as Aching, Constant, Discomfort, Numbness. Temporal description, or timing of pain is:  . Possible modifying factors: medications, helps relax. Alyssa Wood's  height is _0  (1.626 m) and weight is 151 lb (68.5 kg). Her temperature is 98.4 F (36.9 C). Her blood pressure is 148/77 (abnormal) and her pulse is 89. Her respiration is 16 and oxygen saturation is 99%.  She feels like she is doing well. She denies any new concerns today. She admits that she has a refill on her medication; Tramadol. She is going ot pick them up today. She feels like the cyclobenzaprine makes her have a headache. She is no longer going to use this.     Controlled Substance Pharmacotherapy Assessment REMS (Risk Evaluation and Mitigation Strategy)  Analgesic:Tramadol 50 mgQD-TID PRN. Tramadol 50-12m/day MME/day:5-15 mg/day. [name]  04/11/2018 10:14 AM  Sign when Signing Visit Nursing Pain Medication Assessment:  Safety precautions to be maintained throughout the outpatient stay will include: orient to surroundings, keep bed in low position,  maintain call bell within reach at all times, provide assistance with transfer out of bed and ambulation.  Medication Inspection Compliance: [name] not comply with our request to bring her pills to be counted. She was reminded that bringing the medication bottles, even when empty, is a requirement.  Medication: None brought in. Pill/Patch Count: None available to be counted. Bottle Appearance: No container available. Did not bring bottle(s) to appointment. Filled Date: N/A Last Medication intake:  Today   Pharmacokinetics: Liberation and absorption (onset of action): WNL Distribution (time to peak effect): WNL Metabolism and excretion (duration of action): WNL         Pharmacodynamics: Desired effects: Analgesia: Ms. DStillionreports >50% benefit. Functional ability: Patient reports that medication allows her to accomplish basic ADLs Clinically meaningful improvement in function (CMIF): Sustained CMIF goals met Perceived effectiveness: Described as relatively effective, allowing for increase in activities of daily living (ADL) Undesirable effects: Side-effects or Adverse reactions: None reported Monitoring: [name] PMP: Online review of the past 155-montheriod conducted. Compliant with practice rules and regulations Last UDS on record: Summary  Date Value Ref Range Status  07/12/2017 FINAL  Final    Comment:    ==================================================================== TOXASSURE SELECT 13 (MW) ==================================================================== Test                             Result       Flag       Units Drug Present and Declared for Prescription Verification   Tramadol  3973         EXPECTED   ng/mg creat   O-Desmethyltramadol            5712         EXPECTED   ng/mg creat    Source of tramadol is a prescription medication.    O-desmethyltramadol is an expected metabolite of  tramadol. ==================================================================== Test                      Result    Flag   Units      Ref Range   Creatinine              41               mg/dL      >=20 ==================================================================== Declared Medications:  The flagging and interpretation on this report are based on the  following declared medications.  Unexpected results may arise from  inaccuracies in the declared medications.  **Note: The testing scope of this panel includes these medications:  Tramadol  **Note: The testing scope of this panel does not include following  reported medications:  Calcium carbonate (Calcarb with Vitamin D)  Cyanocobalamin  Cyclobenzaprine  Docusate  Hydrochlorothiazide (Lisinopril-HCTZ)  Lisinopril (Lisinopril-HCTZ)  Meloxicam  Omeprazole  Oxybutynin  Sertraline  Vitamin D (Calcarb with Vitamin D) ==================================================================== For clinical consultation, please call [phone]. ====================================================================    UDS interpretation: Compliant          Medication Assessment Form: Reviewed. Patient indicates being compliant with therapy Treatment compliance: Compliant Risk Assessment Profile: Aberrant behavior: See initial evaluations. None observed or detected today Comorbid factors increasing risk of overdose: See initial evaluation. No additional risks detected today Opioid risk tool (ORT):  Opioid Risk  04/11/2018  Alcohol 0  Illegal Drugs 0  Rx Drugs 0  Alcohol 0  Illegal Drugs 0  Rx Drugs 0  Age between 16-45 years  0  History of Preadolescent Sexual Abuse -  Psychological Disease 2  ADD Negative  OCD Negative  Bipolar Negative  Depression 1  Opioid Risk Tool Scoring 3  Opioid Risk Interpretation Low Risk    ORT Scoring interpretation table:  Score <3 = Low Risk for SUD  Score between 4-7 = Moderate Risk for SUD   Score >8 = High Risk for Opioid Abuse   Risk of substance use disorder (SUD): Low  Risk Mitigation Strategies:  Patient Counseling: Covered Patient-Prescriber Agreement (PPA): Present and active  Notification to other healthcare providers: Done  Pharmacologic Plan: No change in therapy, at this time.             ROS  Constitutional: Denies any fever or chills Gastrointestinal: No reported hemesis, hematochezia, vomiting, or acute GI distress Musculoskeletal: Denies any acute onset joint swelling, redness, loss of ROM, or weakness Neurological: No reported episodes of acute onset apraxia, aphasia, dysarthria, agnosia, amnesia, paralysis, loss of coordination, or loss of consciousness  Medication Review  Calcium Carbonate-Vitamin D, Cyanocobalamin, lisinopril-hydrochlorothiazide, meloxicam, metFORMIN, omega-3 acid ethyl esters, omeprazole, sertraline, and traMADol  History Review  Allergy: Ms. Ragon is allergic to trazodone and nefazodone and hydrocodone. Drug: Ms. Pickel  reports no history of drug use. Alcohol:  reports no history of alcohol use. Tobacco:  reports that she has never smoked. She has never used smokeless tobacco. Social: Ms. Manternach  reports that she has never smoked. She has never used smokeless tobacco. She reports that she does not drink alcohol or use drugs. Medical:  has a past medical history of Anxiety, Chronic pain, Depression, Diabetes mellitus without complication (Wood), GERD (gastroesophageal reflux disease), and Hypertension. Surgical: Ms. Peavler  has a past surgical history that includes Cholecystectomy (1992); Spinal cord stimulator implant (Right, Jan 2013); Shoulder surgery (Right); Wrist surgery (Right); Colonoscopy with propofol (N/A, 08/28/2015); and polypectomy (08/28/2015). Family: family history includes Cancer in her father; Heart disease in her father. Problem List: Ms. Traeger has Chronic neck  pain (Primary Area of Pain) (Bilateral) (R>L); Chronic low back pain (Fourth Area of Pain) (Bilateral) (L>R); Chronic upper extremity pain (Bilateral) (R>L); Chronic pain syndrome; Chronic shoulder pain (Secondary Area of Pain) (Bilateral) (R>L); Cervicogenic headache (Tertiary Area of Pain); Cervical Spinal cord stimulator (Fractured Tip); Chronic musculoskeletal pain; Chronic cervical radiculitis (Bilateral) (R>L); Cervical spondylosis with radiculopathy (Bilateral); and Chronic shoulder radicular pain (Bilateral) on their pertinent problem list.  Lab Review  Kidney Function Lab Results  Component Value Date   BUN 12 01/11/2017   CREATININE 0.70 12/20/2017   BCR 19 01/11/2017   GFRAA 115 01/11/2017   GFRNONAA 100 01/11/2017  Liver Function Lab Results  Component Value Date   AST 43 (H) 01/11/2017   ALT 40 12/12/2016   ALBUMIN 4.5 01/11/2017  Note: Above Lab results reviewed.  Imaging Review  CT PELVIS W CONTRAST CLINICAL DATA:  Urinary incontinence, frequency, and dysuria. Pelvic mass. 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  EXAM: CT PELVIS WITH CONTRAST  TECHNIQUE: Multidetector CT imaging of the pelvis was performed using the standard protocol following the bolus administration of intravenous contrast.  CONTRAST:  121m ISOVUE-300 IOPAMIDOL (ISOVUE-300) INJECTION 61%  COMPARISON:  None.  FINDINGS: Urinary Tract: Unremarkable unopacified urinary bladder. No cystic lesion seen in region of urethra.  Bowel: Unremarkable pelvic bowel loops.  Vascular/Lymphatic: No pathologically enlarged lymph nodes or other significant abnormality.  Reproductive:  No mass or other significant abnormality.  Other: No evidence of pelvic organ prolapse at rest.  Musculoskeletal: No significant abnormality identified.  IMPRESSION: Negative. No evidence of urethral diverticulum or other pelvic mass.  Electronically Signed   By: JEarle GellM.D.   On: 12/20/2017 13:14 Note: Reviewed         Physical Exam  General appearance: Well nourished, well developed, and well hydrated. In no apparent acute distress Mental status: Alert, oriented x 3 (person, place, & time)       Respiratory: No evidence of acute respiratory distress Eyes: PERLA Vitals: BP (!) 148/77   Pulse 89   Temp 98.4 F (36.9 C)   Resp 16   Ht _0  (1.626 m)   Wt 151 lb (68.5 kg)   SpO2 99%   BMI 25.92 kg/m  BMI: Estimated body mass index is 25.92 kg/m as calculated from the following:   Height as of this encounter: _1  (1.626 m).   Weight as of this encounter: 151 lb (68.5 kg). Ideal: Ideal body weight: 54.7 kg (120 lb 9.5 oz) Adjusted ideal body weight: 60.2 kg (132 lb 12.1 oz) Cervical Spine Area Exam  Skin & Axial Inspection: No masses, redness, edema, swelling, or associated skin lesions Alignment: Symmetrical Functional ROM: Adequate ROM      Stability: No instability detected Muscle Tone/Strength: Functionally intact. No obvious neuro-muscular anomalies detected. Sensory (Neurological): Unimpaired Palpation: Uncomfortable             Upper Extremity (UE) Exam    Side: Right upper extremity  Side: Left upper extremity  Skin & Extremity Inspection: Skin color, temperature, and hair growth are WNL. No  peripheral edema or cyanosis. No masses, redness, swelling, asymmetry, or associated skin lesions. No contractures.  Skin & Extremity Inspection: Skin color, temperature, and hair growth are WNL. No peripheral edema or cyanosis. No masses, redness, swelling, asymmetry, or associated skin lesions. No contractures.  Functional ROM: Unrestricted ROM          Functional ROM: Unrestricted ROM          Muscle Tone/Strength: Functionally intact. No obvious neuro-muscular anomalies detected.  Muscle Tone/Strength: Functionally intact. No obvious neuro-muscular anomalies detected.  Sensory (Neurological): Unimpaired          Sensory (Neurological): Unimpaired          Palpation: No palpable anomalies               Palpation: No palpable anomalies                   Assessment   Status Diagnosis  Controlled Controlled Controlled 1. Cervical spondylosis with radiculopathy (Bilateral)   2. Chronic cervical radiculitis (Bilateral) (R>L)   3. Chronic musculoskeletal pain   4. Chronic pain syndrome   5. Long term prescription opiate use      Updated Problems: No problems updated.  Plan of Care  Pharmacotherapy (Medications Ordered): Meds ordered this encounter  Medications  . traMADol (ULTRAM) 50 MG tablet    Sig: Take 1 tablet (50 mg total) by mouth 3 (three) times daily as needed for severe pain.    Dispense:  90 tablet    Refill:  5    Do not place this medication, or any other prescription from our practice, on "Automatic Refill". Patient may have prescription filled one day early if pharmacy is closed on scheduled refill date.    Order Specific Question:   Supervising Provider    Answer:   Milinda Pointer [phone]  . meloxicam (MOBIC) 15 MG tablet    Sig: Take 1 tablet (15 mg total) by mouth daily.    Dispense:  30 tablet    Refill:  5    Order Specific Question:   Supervising Provider    Answer:   Milinda Pointer [phone]   Administered today: [name] had no medications administered during this visit.  Orders:  Orders Placed This Encounter  Procedures  . ToxASSURE Select 13 (MW), Urine    Volume: 30 ml(s). Minimum 3 ml of urine is needed. Document temperature of fresh sample. Indications: Long term (current) use of opiate analgesic (V20.037)   Interventional options: Planned follow-up:   Not at this time.  Plan: Return in about 6 months (around 10/12/2018) for MedMgmt.   Considering: Diagnosticleft suprascapular nerve block Possible left suprascapular nerve RFA Diagnostic bilateral cervical facet nerve block Possiblebilateral cervical facet RFA Diagnosticbilateral suprascapular nerve block Possiblebilateral suprascapular  RFA Diagnosticoccipital nerve block Diagnosticbilateral lumbar facet nerve block Possible bilateral lumbar RFA   PRN Procedures: None at this time     Note by: Dionisio David, NP Date: 04/11/2018; Time: 10:51 AM

## 2018-04-11 NOTE — Progress Notes (Signed)
Nursing Pain Medication Assessment:  Safety precautions to be maintained throughout the outpatient stay will include: orient to surroundings, keep bed in low position, maintain call bell within reach at all times, provide assistance with transfer out of bed and ambulation.  Medication Inspection Compliance: Ms. Daughdrill did not comply with our request to bring her pills to be counted. She was reminded that bringing the medication bottles, even when empty, is a requirement.  Medication: None brought in. Pill/Patch Count: None available to be counted. Bottle Appearance: No container available. Did not bring bottle(s) to appointment. Filled Date: N/A Last Medication intake:  Today

## 2018-04-11 NOTE — Patient Instructions (Signed)
____________________________________________________________________________________________  Medication Rules  Purpose: To inform patients, and their family members, of our rules and regulations.  Applies to: All patients receiving prescriptions (written or electronic).  Pharmacy of record: Pharmacy where electronic prescriptions will be sent. If written prescriptions are taken to a different pharmacy, please inform the nursing staff. The pharmacy listed in the electronic medical record should be the one where you would like electronic prescriptions to be sent.  Electronic prescriptions: In compliance with the [place] Strengthen Opioid Misuse Prevention (STOP) Act of 2017 (Session Law 2017-74/H243), effective February 08, 2018, all controlled substances must be electronically prescribed. Calling prescriptions to the pharmacy will cease to exist.  Prescription refills: Only during scheduled appointments. Applies to all prescriptions.  NOTE: The following applies primarily to controlled substances (Opioid* Pain Medications).   Patient's responsibilities: 1. Pain Pills: Bring all pain pills to every appointment (except for procedure appointments). 2. Pill Bottles: Bring pills in original pharmacy bottle. Always bring the newest bottle. Bring bottle, even if empty. 3. Medication refills: You are responsible for knowing and keeping track of what medications you take and those you need refilled. The day before your appointment: write a list of all prescriptions that need to be refilled. The day of the appointment: give the list to the admitting nurse. Prescriptions will be written only during appointments. No prescriptions will be written on procedure days. If you forget a medication: it will not be "Called in", "Faxed", or "electronically sent". You will need to get another appointment to get these prescribed. No early refills. Do not call asking to have your prescription filled  early. 4. Prescription Accuracy: You are responsible for carefully inspecting your prescriptions before leaving our office. Have the discharge nurse carefully go over each prescription with you, before taking them home. Make sure that your name is accurately spelled, that your address is correct. Check the name and dose of your medication to make sure it is accurate. Check the number of pills, and the written instructions to make sure they are clear and accurate. Make sure that you are given enough medication to last until your next medication refill appointment. 5. Taking Medication: Take medication as prescribed. When it comes to controlled substances, taking less pills or less frequently than prescribed is permitted and encouraged. Never take more pills than instructed. Never take medication more frequently than prescribed.  6. Inform other Doctors: Always inform, all of your healthcare providers, of all the medications you take. 7. Pain Medication from other Providers: You are not allowed to accept any additional pain medication from any other Doctor or Healthcare provider. There are two exceptions to this rule. (see below) In the event that you require additional pain medication, you are responsible for notifying us, as stated below. 8. Medication Agreement: You are responsible for carefully reading and following our Medication Agreement. This must be signed before receiving any prescriptions from our practice. Safely store a copy of your signed Agreement. Violations to the Agreement will result in no further prescriptions. (Additional copies of our Medication Agreement are available upon request.) 9. Laws, Rules, & Regulations: All patients are expected to follow all Federal and State Laws, Statutes, Rules, & Regulations. Ignorance of the Laws does not constitute a valid excuse. The use of any illegal substances is prohibited. 10. Adopted CDC guidelines & recommendations: Target dosing levels will be  at or below 60 MME/day. Use of benzodiazepines** is not recommended.  Exceptions: There are only two exceptions to the rule of not   receiving pain medications from other Healthcare Providers. 1. Exception #1 (Emergencies): In the event of an emergency (i.e.: accident requiring emergency care), you are allowed to receive additional pain medication. However, you are responsible for: As soon as you are able, call our office [phone], at any time of the day or night, and leave a message stating your name, the date and nature of the emergency, and the name and dose of the medication prescribed. In the event that your call is answered by a member of our staff, make sure to document and save the date, time, and the name of the person that took your information.  2. Exception #2 (Planned Surgery): In the event that you are scheduled by another doctor or dentist to have any type of surgery or procedure, you are allowed (for a period no longer than 30 days), to receive additional pain medication, for the acute post-op pain. However, in this case, you are responsible for picking up a copy of our "Post-op Pain Management for Surgeons" handout, and giving it to your surgeon or dentist. This document is available at our office, and does not require an appointment to obtain it. Simply go to our office during business hours (Monday-Thursday from 8:00 AM to 4:00 PM) (Friday 8:00 AM to 12:00 Noon) or if you have a scheduled appointment with us, prior to your surgery, and ask for it by name. In addition, you will need to provide us with your name, name of your surgeon, type of surgery, and date of procedure or surgery.  *Opioid medications include: morphine, codeine, oxycodone, oxymorphone, hydrocodone, hydromorphone, meperidine, tramadol, tapentadol, buprenorphine, fentanyl, methadone. **Benzodiazepine medications include: diazepam (Valium), alprazolam (Xanax), clonazepam (Klonopine), lorazepam (Ativan), clorazepate  (Tranxene), chlordiazepoxide (Librium), estazolam (Prosom), oxazepam (Serax), temazepam (Restoril), triazolam (Halcion) (Last updated: 04/07/2017) ____________________________________________________________________________________________    

## 2018-04-15 LAB — TOXASSURE SELECT 13 (MW), URINE

## 2018-04-21 ENCOUNTER — Other Ambulatory Visit: Payer: Self-pay

## 2018-04-21 ENCOUNTER — Emergency Department
Admission: EM | Admit: 2018-04-21 | Discharge: 2018-04-21 | Disposition: A | Discharge status: Home / Self Care | Payer: Medicare Other | Attending: Emergency Medicine | Admitting: Emergency Medicine

## 2018-04-21 ENCOUNTER — Emergency Department: Payer: Medicare Other

## 2018-04-21 DIAGNOSIS — Z79899 Other long term (current) drug therapy: Secondary | ICD-10-CM | POA: Diagnosis not present

## 2018-04-21 DIAGNOSIS — R51 Headache: Secondary | ICD-10-CM | POA: Diagnosis present

## 2018-04-21 DIAGNOSIS — R110 Nausea: Secondary | ICD-10-CM | POA: Insufficient documentation

## 2018-04-21 DIAGNOSIS — I10 Essential (primary) hypertension: Secondary | ICD-10-CM | POA: Insufficient documentation

## 2018-04-21 DIAGNOSIS — G44209 Tension-type headache, unspecified, not intractable: Secondary | ICD-10-CM | POA: Diagnosis not present

## 2018-04-21 DIAGNOSIS — Z7984 Long term (current) use of oral hypoglycemic drugs: Secondary | ICD-10-CM | POA: Diagnosis not present

## 2018-04-21 DIAGNOSIS — E119 Type 2 diabetes mellitus without complications: Secondary | ICD-10-CM | POA: Diagnosis not present

## 2018-04-21 DIAGNOSIS — R519 Headache, unspecified: Secondary | ICD-10-CM

## 2018-04-21 LAB — CBC WITH DIFFERENTIAL/PLATELET
Abs Immature Granulocytes: 0.02 10*3/uL (ref 0.00–0.07)
Basophils Absolute: 0.1 10*3/uL (ref 0.0–0.1)
Basophils Relative: 1 %
Eosinophils Absolute: 0.1 10*3/uL (ref 0.0–0.5)
Eosinophils Relative: 1 %
HCT: 37.8 % (ref 36.0–46.0)
Hemoglobin: 13 g/dL (ref 12.0–15.0)
Immature Granulocytes: 0 %
Lymphocytes Relative: 47 %
Lymphs Abs: 4.3 10*3/uL — ABNORMAL HIGH (ref 0.7–4.0)
MCH: 29.1 pg (ref 26.0–34.0)
MCHC: 34.4 g/dL (ref 30.0–36.0)
MCV: 84.6 fL (ref 80.0–100.0)
Monocytes Absolute: 0.7 10*3/uL (ref 0.1–1.0)
Monocytes Relative: 7 %
NRBC: 0 % (ref 0.0–0.2)
Neutro Abs: 4.1 10*3/uL (ref 1.7–7.7)
Neutrophils Relative %: 44 %
Platelets: 267 10*3/uL (ref 150–400)
RBC: 4.47 MIL/uL (ref 3.87–5.11)
RDW: 13 % (ref 11.5–15.5)
WBC: 9.3 10*3/uL (ref 4.0–10.5)

## 2018-04-21 LAB — COMPREHENSIVE METABOLIC PANEL
ALT: 39 U/L (ref 0–44)
AST: 45 U/L — ABNORMAL HIGH (ref 15–41)
Albumin: 4.4 g/dL (ref 3.5–5.0)
Alkaline Phosphatase: 106 U/L (ref 38–126)
Anion gap: 11 (ref 5–15)
BUN: 12 mg/dL (ref 6–20)
CALCIUM: 9.4 mg/dL (ref 8.9–10.3)
CO2: 22 mmol/L (ref 22–32)
Chloride: 102 mmol/L (ref 98–111)
Creatinine, Ser: 0.76 mg/dL (ref 0.44–1.00)
GFR calc Af Amer: >60 mL/min (ref >60)
Glucose, Bld: 117 mg/dL — ABNORMAL HIGH (ref 70–99)
Potassium: 3.7 mmol/L (ref 3.5–5.1)
Sodium: 135 mmol/L (ref 135–145)
Total Bilirubin: 0.8 mg/dL (ref 0.3–1.2)
Total Protein: 8.1 g/dL (ref 6.5–8.1)

## 2018-04-21 LAB — TROPONIN I: Troponin I: <0.03 ng/mL (ref <0.03)

## 2018-04-21 MED ORDER — ACETAMINOPHEN 500 MG PO TABS
1000.0000 mg | ORAL_TABLET | Freq: Once | ORAL | Status: AC
  Administered 2018-04-21: 1000 mg via ORAL
  Filled 2018-04-21: qty 2

## 2018-04-21 MED ORDER — ONDANSETRON 4 MG PO TBDP
4.0000 mg | ORAL_TABLET | Freq: Once | ORAL | Status: AC
  Administered 2018-04-21: 4 mg via ORAL
  Filled 2018-04-21: qty 1

## 2018-04-21 MED ORDER — LORAZEPAM 2 MG/ML IJ SOLN
0.5000 mg | Freq: Once | INTRAMUSCULAR | Status: AC
  Administered 2018-04-21: 0.5 mg via INTRAVENOUS
  Filled 2018-04-21: qty 1
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
## 2018-04-21 NOTE — ED Notes (Addendum)
Pt given a beverage and crackers to see if she has continuing nausea. Discharge paperwork reviewed with pt and family with Eyvonne Mechanic, Therapist, sports.
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
## 2018-04-21 NOTE — ED Notes (Signed)
Patient able to tolerate PO fluids and food with no issue

## 2018-04-21 NOTE — ED Notes (Signed)
Reviewed discharge instructions, follow-up care with patient. Patient verbalized understanding of all information reviewed. Patient stable, with no distress noted at this time.    

## 2018-10-05 ENCOUNTER — Encounter: Payer: Self-pay | Admitting: Pain Medicine

## 2018-10-08 NOTE — Progress Notes (Signed)
Pain Management Virtual Encounter Note - Virtual Visit via Telephone Telehealth (real-time audio visits between healthcare provider and patient).   Patient's Phone No. & Preferred Pharmacy:  [phone] (home); There is no such number on file (mobile).; (Preferred) [phone] [email or web address]  [place], [place] [address] Phone: [phone] Fax: [phone]    Pre-screening note:  Our staff contacted Alyssa Wood and offered her an "in person", "face-to-face" appointment versus a telephone encounter. She indicated preferring the telephone encounter, at this time.   Reason for Virtual Visit: COVID-19*  Social distancing based on CDC and AMA recommendations.   I contacted Alyssa Wood on 10/09/2018 via telephone.      I clearly identified myself as Gaspar Cola, MD. I verified that I was speaking with the correct person using two identifiers (Name: Alyssa Wood, and date of birth: January 17, 2001).  Advanced Informed Consent I sought verbal advanced consent from Alyssa Wood for virtual visit interactions. I informed Alyssa Wood of possible security and privacy concerns, risks, and limitations associated with providing "not-in-person" medical evaluation and management services. I also informed Alyssa Wood of the availability of "in-person" appointments. Finally, I informed her that there would be a charge for the virtual visit and that she could be  personally, fully or partially, financially responsible for it. Alyssa Wood expressed understanding and agreed to proceed.   Historic Elements   Alyssa Wood is a 58 y.o. year old, female patient evaluated today after her last encounter by our practice on Visit date not found. Alyssa Wood  has a past medical history of Anxiety, Chronic pain, Depression,  Diabetes mellitus without complication ([place]), GERD (gastroesophageal reflux disease), and Hypertension. She also  has a past surgical history that includes Cholecystectomy (1992); Spinal cord stimulator implant (Right, Jan 2013); Shoulder surgery (Right); Wrist surgery (Right); Colonoscopy with propofol (N/A, 08/28/2015); and polypectomy (08/28/2015). Alyssa Wood has a current medication list which includes the following prescription(s): calcium carbonate-vitamin d, cyanocobalamin, lisinopril-hydrochlorothiazide, meloxicam, metformin, omega-3 acid ethyl esters, omeprazole, sertraline, and tramadol. She  reports that she has never smoked. She has never used smokeless tobacco. She reports that she does not drink alcohol or use drugs. Alyssa Wood is allergic to trazodone and nefazodone and hydrocodone.   HPI  Today, she is being contacted for medication management.  The patient indicates doing well with the current medication regimen. No adverse reactions or side effects reported to the medications.  However, the patient reports having experienced flu symptoms and having been tested for COVID, which came back negative.  However she complains that she is still having the symptoms of headache, dry mouth and throat, but she describes that she is no longer having fevers.  The only concern that I have is that she was given acetaminophen ER 600 mg and this is what she has been taking, which would mask the fever.  She reports having a flareup of her upper extremity pain and she would like to come in for a treatment.  The last time that we did a cervical epidural steroid injection for this patient was in June 2019.  I will go ahead and put an order to have her come back to have the cervical epidural steroid injection repeated.  She indicates that her right upper extremity pain is spreading towards the left side.  Pharmacotherapy Assessment  Analgesic: Tramadol 50 mg, 1 tab PO QD (PRN) (50 mg/day of  tramadol) (Ok  to use 3/day, PRN) MME/day: 5 mg/day.   Monitoring: Pharmacotherapy: No side-effects or adverse reactions reported. [place] PMP: PDMP reviewed during this encounter.       Compliance: No problems identified. Effectiveness: Clinically acceptable. Plan: Refer to "POC".  UDS:  Summary  Date Value Ref Range Status  04/11/2018 FINAL  Final    Comment:    ==================================================================== TOXASSURE SELECT 13 (MW) ==================================================================== Test                             Result       Flag       Units Drug Present and Declared for Prescription Verification   Tramadol                       314          EXPECTED   ng/mg creat   O-Desmethyltramadol            758          EXPECTED   ng/mg creat    Source of tramadol is a prescription medication.    O-desmethyltramadol is an expected metabolite of tramadol. ==================================================================== Test                      Result    Flag   Units      Ref Range   Creatinine              50               mg/dL      >=20 ==================================================================== Declared Medications:  The flagging and interpretation on this report are based on the  following declared medications.  Unexpected results may arise from  inaccuracies in the declared medications.  **Note: The testing scope of this panel includes these medications:  Tramadol (Ultram)  **Note: The testing scope of this panel does not include following  reported medications:  Calcium carbonate  Cyclobenzaprine (Flexeril)  Hydrochlorothiazide (Lisinopril-HCTZ)  Lisinopril (Lisinopril-HCTZ)  Meloxicam  Metformin  Omega-3 Fatty Acids (Lovaza)  Omeprazole  Sertraline (Zoloft)  Vitamin D ==================================================================== For clinical consultation, please call [phone]. ====================================================================    Laboratory Chemistry Profile (12 mo)  Renal: 04/21/2018: BUN 12; Creatinine, Ser 0.76  Lab Results  Component Value Date   GFRAA >60 04/21/2018   GFRNONAA >60 04/21/2018   Hepatic: 04/21/2018: Albumin 4.4 Lab Results  Component Value Date   AST 45 (H) 04/21/2018   ALT 39 04/21/2018   Other: No results found for requested labs within last 8760 hours. Note: Above Lab results reviewed.  Imaging  Last 90 days:  No results found.  Assessment  The primary encounter diagnosis was Chronic pain syndrome. Diagnoses of Chronic neck pain (Primary Area of Pain) (Bilateral) (R>L), Chronic shoulder pain (Secondary Area of Pain) (Bilateral) (R>L), Cervicogenic headache (Tertiary Area of Pain), Chronic low back pain (Fourth Area of Pain) (Bilateral) (L>R), Osteoarthritis, Cervical spondylosis with radiculopathy (Bilateral), Chronic cervical radiculitis (Bilateral) (R>L), and DDD (degenerative disc disease), cervical were also pertinent to this visit.  Plan of Care  I am having Alyssa Wood maintain her lisinopril-hydrochlorothiazide, omeprazole, Calcium Carbonate-Vitamin D, Cyanocobalamin (VITAMIN B 12 PO), sertraline, metFORMIN, omega-3 acid ethyl esters, meloxicam, and traMADol.  Pharmacotherapy (Medications Ordered): Meds ordered this encounter  Medications  . meloxicam (MOBIC) 15 MG tablet    Sig: Take 1 tablet (15 mg total) by mouth daily.  Dispense:  90 tablet    Refill:  1    Fill one day early if pharmacy is closed on scheduled refill date. May substitute for generic if available.  [name] DISCONTD: traMADol (ULTRAM) 50 MG tablet    Sig: Take 1 tablet (50 mg total) by mouth daily.    Dispense:  90 tablet    Refill:  1    Chronic Pain: STOP Act (Not applicable) Fill 1 day early if closed on refill date. Do not fill until: 10/09/2018. To last until: 04/07/2019. Avoid benzodiazepines within 8 hours of  opioids  . traMADol (ULTRAM) 50 MG tablet    Sig: Take 1 tablet (50 mg total) by mouth daily.    Dispense:  90 tablet    Refill:  1    Chronic Pain: STOP Act (Not applicable) Fill 1 day early if closed on refill date. Do not fill until: 10/09/2018. To last until: 04/07/2019. Avoid benzodiazepines within 8 hours of opioids   Orders:  Orders Placed This Encounter  Procedures  . Cervical Epidural Injection    Level(s): C7-T1 Laterality: Right-sided Purpose: Diagnostic/Therapeutic Indication(s): Radiculitis and cervicalgia associater with cervical degenerative disc disease.    Standing Status:   Future    Standing Expiration Date:   11/08/2018    Scheduling Instructions:     Procedure: Cervical Epidural Steroid Injection/Block     Sedation: Patient's choice.     Timeframe: As soon as schedule allows    Order Specific Question:   Where will this procedure be performed?    Answer:   [facility]    Comments:   by Dr. Dossie Arbour   Follow-up plan:   Return in about 6 months (around 04/07/2019) for (VV), E/M, (MM), in addition, Procedure (w/ Sedation): (R) CESI #3.      Interventional options:  Considering: Diagnosticleft suprascapular NB Possible left suprascapular nerve RFA Diagnostic bilateral cervical facet block Possiblebilateral cervical facet RFA Diagnosticbilateral suprascapular NB Possiblebilateral suprascapular RFA Diagnosticoccipital NB Diagnosticbilateral lumbar facet block Possible bilateral lumbar RFA   PRN Procedures: Palliative right CESI #3     Recent Visits No visits were found meeting these conditions.  Showing recent visits within past 90 days and meeting all other requirements   Today's Visits Date Type Provider Dept  10/09/18 Office Visit Milinda Pointer, MD [facility]  Showing today's visits and meeting all other requirements   Future Appointments No visits were found meeting these conditions.  Showing future  appointments within next 90 days and meeting all other requirements   I discussed the assessment and treatment plan with the patient. The patient was provided an opportunity to ask questions and all were answered. The patient agreed with the plan and demonstrated an understanding of the instructions.  Patient advised to call back or seek an in-person evaluation if the symptoms or condition worsens.  Total duration of non-face-to-face encounter: 20 minutes.  Note by: Gaspar Cola, MD Date: 10/09/2018; Time: 11:24 AM  Note: This dictation was prepared with Dragon dictation. Any transcriptional errors that may result from this process are unintentional.  Disclaimer:  * Given the special circumstances of the COVID-19 pandemic, the federal government has announced that the Office for Civil Rights (OCR) will exercise its enforcement discretion and will not impose penalties on physicians using telehealth in the event of noncompliance with regulatory requirements under the [place] (HIPAA) in connection with the good faith provision of telehealth during the XX123456 national public health emergency. (  AMA)

## 2018-10-08 NOTE — Patient Instructions (Addendum)
____________________________________________________________________________________________  Preparing for Procedure with Sedation  Procedure appointments are limited to planned procedures: . No Prescription Refills. . No disability issues will be discussed. . No medication changes will be discussed.  Instructions: . Oral Intake: Do not eat or drink anything for at least 8 hours prior to your procedure. . Transportation: Public transportation is not allowed. Bring an adult driver. The driver must be physically present in our waiting room before any procedure can be started. . Physical Assistance: Bring an adult physically capable of assisting you, in the event you need help. This adult should keep you company at home for at least 6 hours after the procedure. . Blood Pressure Medicine: Take your blood pressure medicine with a sip of water the morning of the procedure. . Blood thinners: Notify our staff if you are taking any blood thinners. Depending on which one you take, there will be specific instructions on how and when to stop it. . Diabetics on insulin: Notify the staff so that you can be scheduled 1st case in the morning. If your diabetes requires high dose insulin, take only  of your normal insulin dose the morning of the procedure and notify the staff that you have done so. . Preventing infections: Shower with an antibacterial soap the morning of your procedure. . Build-up your immune system: Take 1000 mg of Vitamin C with every meal (3 times a day) the day prior to your procedure. . Antibiotics: Inform the staff if you have a condition or reason that requires you to take antibiotics before dental procedures. . Pregnancy: If you are pregnant, call and cancel the procedure. . Sickness: If you have a cold, fever, or any active infections, call and cancel the procedure. . Arrival: You must be in the facility at least 30 minutes prior to your scheduled procedure. . Children: Do not bring  children with you. . Dress appropriately: Bring dark clothing that you would not mind if they get stained. . Valuables: Do not bring any jewelry or valuables.  Reasons to call and reschedule or cancel your procedure: (Following these recommendations will minimize the risk of a serious complication.) . Surgeries: Avoid having procedures within 2 weeks of any surgery. (Avoid for 2 weeks before or after any surgery). . Flu Shots: Avoid having procedures within 2 weeks of a flu shots or . (Avoid for 2 weeks before or after immunizations). . Barium: Avoid having a procedure within 7-10 days after having had a radiological study involving the use of radiological contrast. (Myelograms, Barium swallow or enema study). . Heart attacks: Avoid any elective procedures or surgeries for the initial 6 months after a "Myocardial Infarction" (Heart Attack). . Blood thinners: It is imperative that you stop these medications before procedures. Let us know if you if you take any blood thinner.  . Infection: Avoid procedures during or within two weeks of an infection (including chest colds or gastrointestinal problems). Symptoms associated with infections include: Localized redness, fever, chills, night sweats or profuse sweating, burning sensation when voiding, cough, congestion, stuffiness, runny nose, sore throat, diarrhea, nausea, vomiting, cold or Flu symptoms, recent or current infections. It is specially important if the infection is over the area that we intend to treat. . Heart and lung problems: Symptoms that may suggest an active cardiopulmonary problem include: cough, chest pain, breathing difficulties or shortness of breath, dizziness, ankle swelling, uncontrolled high or unusually low blood pressure, and/or palpitations. If you are experiencing any of these symptoms, cancel your procedure and contact   your primary care physician for an evaluation.  Remember:  Regular Business hours are:  Monday to Thursday  8:00 AM to 4:00 PM  Provider's Schedule: Mariaisabel Bodiford, MD:  Procedure days: Tuesday and Thursday 7:30 AM to 4:00 PM  Bilal Lateef, MD:  Procedure days: Monday and Wednesday 7:30 AM to 4:00 PM ____________________________________________________________________________________________    ____________________________________________________________________________________________  Medication Recommendations and Reminders  Applies to: All patients receiving prescriptions (written and/or electronic).  Medication Rules & Regulations: These rules and regulations exist for your safety and that of others. They are not flexible and neither are we. Dismissing or ignoring them will be considered "non-compliance" with medication therapy, resulting in complete and irreversible termination of such therapy. (See document titled "Medication Rules" for more details.) In all conscience, because of safety reasons, we cannot continue providing a therapy where the patient does not follow instructions.  Pharmacy of record:   Definition: This is the pharmacy where your electronic prescriptions will be sent.   We do not endorse any particular pharmacy.  You are not restricted in your choice of pharmacy.  The pharmacy listed in the electronic medical record should be the one where you want electronic prescriptions to be sent.  If you choose to change pharmacy, simply notify our nursing staff of your choice of new pharmacy.  Recommendations:  Keep all of your pain medications in a safe place, under lock and key, even if you live alone.   After you fill your prescription, take 1 week's worth of pills and put them away in a safe place. You should keep a separate, properly labeled bottle for this purpose. The remainder should be kept in the original bottle. Use this as your primary supply, until it runs out. Once it's gone, then you know that you have 1 week's worth of medicine, and it is time to come in  for a prescription refill. If you do this correctly, it is unlikely that you will ever run out of medicine.  To make sure that the above recommendation works, it is very important that you make sure your medication refill appointments are scheduled at least 1 week before you run out of medicine. To do this in an effective manner, make sure that you do not leave the office without scheduling your next medication management appointment. Always ask the nursing staff to show you in your prescription , when your medication will be running out. Then arrange for the receptionist to get you a return appointment, at least 7 days before you run out of medicine. Do not wait until you have 1 or 2 pills left, to come in. This is very poor planning and does not take into consideration that we may need to cancel appointments due to bad weather, sickness, or emergencies affecting our staff.  "Partial Fill": If for any reason your pharmacy does not have enough pills/tablets to completely fill or refill your prescription, do not allow for a "partial fill". You will need a separate prescription to fill the remaining amount, which we will not provide. If the reason for the partial fill is your insurance, you will need to talk to the pharmacist about payment alternatives for the remaining tablets, but again, do not accept a partial fill.  Prescription refills and/or changes in medication(s):   Prescription refills, and/or changes in dose or medication, will be conducted only during scheduled medication management appointments. (Applies to both, written and electronic prescriptions.)  No refills on procedure days. No medication will be changed or   started on procedure days. No changes, adjustments, and/or refills will be conducted on a procedure day. Doing so will interfere with the diagnostic portion of the procedure.  No phone refills. No medications will be "called into the pharmacy".  No Fax refills.  No weekend  refills.  No Holliday refills.  No after hours refills.  Remember:  Business hours are:  Monday to Thursday 8:00 AM to 4:00 PM Provider's Schedule: Milinda Pointer, MD - Appointments are:  Medication management: Monday and Wednesday 8:00 AM to 4:00 PM Procedure day: Tuesday and Thursday 7:30 AM to 4:00 PM Gillis Santa, MD - Appointments are:  Medication management: Tuesday and Thursday 8:00 AM to 4:00 PM Procedure day: Monday and Wednesday 7:30 AM to 4:00 PM (Last update: 04/07/2017) ____________________________________________________________________________________________

## 2018-10-09 ENCOUNTER — Other Ambulatory Visit: Payer: Self-pay

## 2018-10-09 ENCOUNTER — Ambulatory Visit: Payer: Medicare Other | Attending: Nurse Practitioner | Admitting: Pain Medicine

## 2018-10-09 DIAGNOSIS — R51 Headache: Secondary | ICD-10-CM

## 2018-10-09 DIAGNOSIS — G894 Chronic pain syndrome: Secondary | ICD-10-CM | POA: Diagnosis not present

## 2018-10-09 DIAGNOSIS — M542 Cervicalgia: Secondary | ICD-10-CM

## 2018-10-09 DIAGNOSIS — M545 Low back pain, unspecified: Secondary | ICD-10-CM

## 2018-10-09 DIAGNOSIS — M5412 Radiculopathy, cervical region: Secondary | ICD-10-CM

## 2018-10-09 DIAGNOSIS — M159 Polyosteoarthritis, unspecified: Secondary | ICD-10-CM

## 2018-10-09 DIAGNOSIS — M4722 Other spondylosis with radiculopathy, cervical region: Secondary | ICD-10-CM

## 2018-10-09 DIAGNOSIS — M25511 Pain in right shoulder: Secondary | ICD-10-CM

## 2018-10-09 DIAGNOSIS — M5030 Other cervical disc degeneration, unspecified cervical region: Secondary | ICD-10-CM

## 2018-10-09 DIAGNOSIS — G8929 Other chronic pain: Secondary | ICD-10-CM

## 2018-10-09 DIAGNOSIS — M150 Primary generalized (osteo)arthritis: Secondary | ICD-10-CM

## 2018-10-09 DIAGNOSIS — G4486 Cervicogenic headache: Secondary | ICD-10-CM

## 2018-10-09 DIAGNOSIS — M25512 Pain in left shoulder: Secondary | ICD-10-CM

## 2018-10-09 MED ORDER — TRAMADOL HCL 50 MG PO TABS: 50.0000 mg | ORAL_TABLET | Freq: Every day | ORAL | 1 refills | Status: DC

## 2018-10-09 MED ORDER — MELOXICAM 15 MG PO TABS: 15.0000 mg | ORAL_TABLET | Freq: Every day | ORAL | 1 refills | Status: DC

## 2018-10-10 ENCOUNTER — Encounter: Payer: Medicare Other | Admitting: Nurse Practitioner

## 2018-10-26 ENCOUNTER — Ambulatory Visit: Payer: Medicare Other | Admitting: Pain Medicine

## 2018-10-26 DIAGNOSIS — M542 Cervicalgia: Secondary | ICD-10-CM | POA: Insufficient documentation

## 2018-10-26 NOTE — Progress Notes (Deleted)
Patient's Name: Alyssa Wood  MRN: [id number]  Referring Provider: [facility], [facility]  DOB: [birth year]  PCP: [facility], [place]  DOS: 10/26/2018  Note by: Gaspar Cola, MD  Service setting: Ambulatory outpatient  Specialty: Interventional Pain Management  Patient type: Established  Location: [facility] (AMB) Pain Management Facility  Visit type: Interventional Procedure   Primary Reason for Visit: Interventional Pain Management Treatment. CC: No chief complaint on file.  Procedure:          Anesthesia, Analgesia, Anxiolysis:  Type: Diagnostic, Inter-Laminar, Cervical Epidural Steroid Injection  #3  Region: Posterior Cervico-thoracic Region Level: C7-T1 Laterality: Right-Sided Paramedial  Type: Moderate (Conscious) Sedation combined with Local Anesthesia Indication(s): Analgesia and Anxiety Route: Intravenous (IV) IV Access: Secured Sedation: Meaningful verbal contact was maintained at all times during the procedure  Local Anesthetic: Lidocaine 1-2%  Position: Prone with head of the table was raised to facilitate breathing.   Indications: 1. DDD (degenerative disc disease), cervical   2. Cervical spondylosis with radiculopathy (Bilateral)   3. Chronic cervical radiculitis (Bilateral) (R>L)   4. Cervicalgia (Primary Area of Pain) (Bilateral) (R>L)   5. Chronic shoulder radicular pain (Bilateral)    Pain Score: Pre-procedure:  /10 Post-procedure:  /10   Pre-op Assessment:  Alyssa Wood is a 58 y.o. (year old), female patient, seen today for interventional treatment. She  has a past surgical history that includes Cholecystectomy (1992); Spinal cord stimulator implant (Right, Jan 2013); Shoulder surgery (Right); Wrist surgery (Right); Colonoscopy with propofol (N/A, 08/28/2015); and polypectomy (08/28/2015). Alyssa Wood has a current medication list which includes the following prescription(s): calcium carbonate-vitamin d,  cyanocobalamin, lisinopril-hydrochlorothiazide, meloxicam, metformin, omega-3 acid ethyl esters, omeprazole, sertraline, and tramadol. Her primarily concern today is the No chief complaint on file.  Initial Vital Signs:  Pulse/HCG Rate:    Temp:   Resp:   BP:   SpO2:    BMI: Estimated body mass index is 25.92 kg/m as calculated from the following:   Height as of 04/21/18: 5\' 4"  (1.626 m).   Weight as of 04/21/18: 151 lb (68.5 kg).  Risk Assessment: Allergies: Reviewed. She is allergic to trazodone and nefazodone and hydrocodone.  Allergy Precautions: None required Coagulopathies: Reviewed. None identified.  Blood-thinner therapy: None at this time Active Infection(s): Reviewed. None identified. Alyssa Wood is afebrile  Site Confirmation: Alyssa Wood was asked to confirm the procedure and laterality before marking the site Procedure checklist: Completed Consent: Before the procedure and under the influence of no sedative(s), amnesic(s), or anxiolytics, the patient was informed of the treatment options, risks and possible complications. To fulfill our ethical and legal obligations, as recommended by the American Medical Association's Code of Ethics, I have informed the patient of my clinical impression; the nature and purpose of the treatment or procedure; the risks, benefits, and possible complications of the intervention; the alternatives, including doing nothing; the risk(s) and benefit(s) of the alternative treatment(s) or procedure(s); and the risk(s) and benefit(s) of doing nothing. The patient was provided information about the general risks and possible complications associated with the procedure. These may include, but are not limited to: failure to achieve desired goals, infection, bleeding, organ or nerve damage, allergic reactions, paralysis, and death. In addition, the patient was informed of those risks and complications associated to Spine-related procedures,  such as failure to decrease pain; infection (i.e.: Meningitis, epidural or intraspinal abscess); bleeding (i.e.: epidural hematoma, subarachnoid hemorrhage, or any other type of intraspinal or peri-dural bleeding); organ or nerve damage (  i.e.: Any type of peripheral nerve, nerve root, or spinal cord injury) with subsequent damage to sensory, motor, and/or autonomic systems, resulting in permanent pain, numbness, and/or weakness of one or several areas of the body; allergic reactions; (i.e.: anaphylactic reaction); and/or death. Furthermore, the patient was informed of those risks and complications associated with the medications. These include, but are not limited to: allergic reactions (i.e.: anaphylactic or anaphylactoid reaction(s)); adrenal axis suppression; blood sugar elevation that in diabetics may result in ketoacidosis or comma; water retention that in patients with history of congestive heart failure may result in shortness of breath, pulmonary edema, and decompensation with resultant heart failure; weight gain; swelling or edema; medication-induced neural toxicity; particulate matter embolism and blood vessel occlusion with resultant organ, and/or nervous system infarction; and/or aseptic necrosis of one or more joints. Finally, the patient was informed that Medicine is not an exact science; therefore, there is also the possibility of unforeseen or unpredictable risks and/or possible complications that may result in a catastrophic outcome. The patient indicated having understood very clearly. We have given the patient no guarantees and we have made no promises. Enough time was given to the patient to ask questions, all of which were answered to the patient's satisfaction. Alyssa Wood has indicated that she wanted to continue with the procedure. Attestation: I, the ordering provider, attest that I have discussed with the patient the benefits, risks, side-effects, alternatives, likelihood of  achieving goals, and potential problems during recovery for the procedure that I have provided informed consent. Date  Time: {CHL [facility]-PAIN TIME CHOICES:21018001}  Pre-Procedure Preparation:  Monitoring: As per clinic protocol. Respiration, ETCO2, SpO2, BP, heart rate and rhythm monitor placed and checked for adequate function Safety Precautions: Patient was assessed for positional comfort and pressure points before starting the procedure. Time-out: I initiated and conducted the "Time-out" before starting the procedure, as per protocol. The patient was asked to participate by confirming the accuracy of the "Time Out" information. Verification of the correct person, site, and procedure were performed and confirmed by me, the nursing staff, and the patient. "Time-out" conducted as per Joint Commission's Universal Protocol (UP.01.01.01). Time:    Description of Procedure:          Target Area: For Epidural Steroid injections the target is the interlaminar space, initially targeting the lower border of the superior vertebral body lamina. Approach: Paramedial approach. Area Prepped: Entire PosteriorCervical Region Prepping solution: DuraPrep (Iodine Povacrylex [0.7% available iodine] and Isopropyl Alcohol, 74% w/w) Safety Precautions: Aspiration looking for blood return was conducted prior to all injections. At no point did we inject any substances, as a needle was being advanced. No attempts were made at seeking any paresthesias. Safe injection practices and needle disposal techniques used. Medications properly checked for expiration dates. SDV (single dose vial) medications used. Description of the Procedure: Protocol guidelines were followed. The procedure needle was introduced through the skin, ipsilateral to the reported pain, and advanced to the target area. Bone was contacted and the needle walked caudad, until the lamina was cleared. The epidural space was identified using "loss-of-resistance  technique" with 2-3 ml of PF-NaCl (0.9% NSS), in a 5cc LOR glass syringe. There were no vitals filed for this visit.  Start Time:   hrs. End Time:   hrs. Materials:  Needle(s) Type: Epidural needle Gauge: 17G Length: 3.5-in Medication(s): Please see orders for medications and dosing details.  Imaging Guidance (Spinal):          Type of Imaging Technique: Fluoroscopy Guidance (  Spinal) Indication(s): Assistance in needle guidance and placement for procedures requiring needle placement in or near specific anatomical locations not easily accessible without such assistance. Exposure Time: Please see nurses notes. Contrast: Before injecting any contrast, we confirmed that the patient did not have an allergy to iodine, shellfish, or radiological contrast. Once satisfactory needle placement was completed at the desired level, radiological contrast was injected. Contrast injected under live fluoroscopy. No contrast complications. See chart for type and volume of contrast used. Fluoroscopic Guidance: I was personally present during the use of fluoroscopy. "Tunnel Vision Technique" used to obtain the best possible view of the target area. Parallax error corrected before commencing the procedure. "Direction-depth-direction" technique used to introduce the needle under continuous pulsed fluoroscopy. Once target was reached, antero-posterior, oblique, and lateral fluoroscopic projection used confirm needle placement in all planes. Images permanently stored in EMR. Interpretation: I personally interpreted the imaging intraoperatively. Adequate needle placement confirmed in multiple planes. Appropriate spread of contrast into desired area was observed. No evidence of afferent or efferent intravascular uptake. No intrathecal or subarachnoid spread observed. Permanent images saved into the patient's record.  Antibiotic Prophylaxis:   Anti-infectives (From admission, onward)   None     Indication(s): None  identified  Post-operative Assessment:  Post-procedure Vital Signs:  Pulse/HCG Rate:    Temp:   Resp:   BP:   SpO2:    EBL: None  Complications: No immediate post-treatment complications observed by team, or reported by patient.  Note: The patient tolerated the entire procedure well. A repeat set of vitals were taken after the procedure and the patient was kept under observation following institutional policy, for this type of procedure. Post-procedural neurological assessment was performed, showing return to baseline, prior to discharge. The patient was provided with post-procedure discharge instructions, including a section on how to identify potential problems. Should any problems arise concerning this procedure, the patient was given instructions to immediately contact us, at any time, without hesitation. In any case, we plan to contact the patient by telephone for a follow-up status report regarding this interventional procedure.  Comments:  No additional relevant information.  Plan of Care  Orders:  No orders of the defined types were placed in this encounter.  Chronic Opioid Analgesic:  Tramadol 50 mg, 1 tab PO QD (PRN) (50 mg/day of tramadol) (Ok to use 3/day, PRN) MME/day: 5 mg/day.   Medications ordered for procedure: No orders of the defined types were placed in this encounter.  Medications administered: Kay Dominguez-Vasquez had no medications administered during this visit.  See the medical record for exact dosing, route, and time of administration.  Follow-up plan:   No follow-ups on file.       Interventional options:  Considering: Diagnosticleft suprascapular NB Possible left suprascapular nerve RFA Diagnostic bilateral cervical facet block Possiblebilateral cervical facet RFA Diagnosticbilateral suprascapular NB Possiblebilateral suprascapular RFA Diagnosticoccipital NB Diagnosticbilateral lumbar facet block Possible bilateral lumbar  RFA   PRN Procedures: Palliative right CESI #3      Recent Visits Date Type Provider Dept  10/09/18 Office Visit Milinda Pointer, MD [facility]  Showing recent visits within past 90 days and meeting all other requirements   Today's Visits Date Type Provider Dept  10/26/18 Appointment Milinda Pointer, MD [facility]  Showing today's visits and meeting all other requirements   Future Appointments No visits were found meeting these conditions.  Showing future appointments within next 90 days and meeting all other requirements   Disposition: Discharge home  Discharge Date & Time: 10/26/2018;   hrs.   Primary Care Physician: [facility], [place] Location: [facility] Note by: Gaspar Cola, MD Date: 10/26/2018; Time: 7:12 AM  Disclaimer:  Medicine is not an Chief Strategy Officer. The only guarantee in medicine is that nothing is guaranteed. It is important to note that the decision to proceed with this intervention was based on the information collected from the patient. The Data and conclusions were drawn from the patient's questionnaire, the interview, and the physical examination. Because the information was provided in large part by the patient, it cannot be guaranteed that it has not been purposely or unconsciously manipulated. Every effort has been made to obtain as much relevant data as possible for this evaluation. It is important to note that the conclusions that lead to this procedure are derived in large part from the available data. Always take into account that the treatment will also be dependent on availability of resources and existing treatment guidelines, considered by other Pain Management Practitioners as being common knowledge and practice, at the time of the intervention. For Medico-Legal purposes, it is also important to point out that variation in procedural techniques and pharmacological choices are  the acceptable norm. The indications, contraindications, technique, and results of the above procedure should only be interpreted and judged by a Board-Certified Interventional Pain Specialist with extensive familiarity and expertise in the same exact procedure and technique.

## 2019-03-20 ENCOUNTER — Encounter: Payer: Self-pay | Admitting: Pain Medicine

## 2019-03-20 NOTE — Progress Notes (Signed)
Patient: Alyssa Wood  Service Category: E/M  Provider: Gaspar Cola, MD  DOB: September 23, 1960  DOS: 03/21/2019  Location: Office  MRN: [id number]  Setting: Ambulatory outpatient  Referring Provider: [facility] [place]  Type: Established Patient  Specialty: Interventional Pain Management  PCP: [facility], [place]  Location: Remote location  Delivery: TeleHealth     Virtual Encounter - Pain Management PROVIDER NOTE: Information contained herein reflects review and annotations entered in association with encounter. Interpretation of such information and data should be left to medically-trained personnel. Information provided to patient can be located elsewhere in the medical record under "Patient Instructions". Document created using STT-dictation technology, any transcriptional errors that may result from process are unintentional.    Contact & Pharmacy Preferred: [phone] Home: [phone] (home) Mobile: There is no such number on file (mobile). E-mail: [email or web address]  [facility] [place] [facility] [address] [place] [address] Phone: [phone] Fax: [phone]   Pre-screening  Alyssa Wood offered "in-person" vs "virtual" encounter. She indicated preferring virtual for this encounter.   Reason COVID-19*  Social distancing based on CDC and AMA recommendations.   I contacted Alyssa Wood on 03/21/2019 via telephone.      I clearly identified myself as Gaspar Cola, MD. I verified that I was speaking with the correct person using two identifiers (Name: Alyssa Wood, and date of birth: Nov 30, 1960).  Consent I sought verbal advanced consent from Alyssa Wood for virtual visit interactions. I informed Alyssa Wood of possible security and privacy concerns, risks, and limitations associated with providing "not-in-person" medical  evaluation and management services. I also informed Alyssa Wood of the availability of "in-person" appointments. Finally, I informed her that there would be a charge for the virtual visit and that she could be  personally, fully or partially, financially responsible for it. Alyssa Wood expressed understanding and agreed to proceed.   Historic Elements   Alyssa Wood is a 59 y.o. year old, female patient evaluated today after her last contact with our practice on 10/09/2018. Alyssa Wood  has a past medical history of Anxiety, Chronic pain, Depression, Diabetes mellitus without complication ([place]), GERD (gastroesophageal reflux disease), and Hypertension. She also  has a past surgical history that includes Cholecystectomy (1992); Spinal cord stimulator implant (Right, Jan 2013); Shoulder surgery (Right); Wrist surgery (Right); Colonoscopy with propofol (N/A, 08/28/2015); and polypectomy (08/28/2015). Alyssa Wood has a current medication list which includes the following prescription(s): calcium carbonate-vitamin d, cyanocobalamin, lisinopril-hydrochlorothiazide, [START ON 04/07/2019] meloxicam, metformin, omega-3 acid ethyl esters, omeprazole, sertraline, [START ON 04/07/2019] tramadol, and gabapentin. She  reports that she has never smoked. She has never used smokeless tobacco. She reports that she does not drink alcohol or use drugs. Alyssa Wood is allergic to trazodone and nefazodone and hydrocodone.   HPI  Today, she is being contacted for medication management.  The patient indicates doing well with the current medication regimen. No adverse reactions or side effects reported to the medications.  The patient indicates that unfortunately her pain has been getting worse and she is able to do activities of daily living secondary to her severe bilateral hand pain.  She indicated that the opposite side is now getting worse.  She wonders if she can take  up to 1 tramadol twice daily.  I have absolutely no problems with this since she is using very little pain medication.  I have offered today a cervical epidural steroid injection for  this flareup of her pain, but she indicated that she is trying to avoid any type of interventional therapy.  Today we had a long conversation about all other options including doing another trial of gabapentin.  In the past she had try some gabapentin, but she indicated that it that seem to have helped significantly.  However, at that time I believe that she was having some unrealistic expectations.  We also did a trial of Lyrica, but apparently she had some side effects to that and it made her too sleepy.  Today we have decided to go ahead with a trial of the gabapentin and I have instructed the patient to start taking 100 mg at bedtime and to increase it every 2 to 3 weeks by another 100 mg up to a total of 9 tablets at bedtime (900 mg).  I will call her in about 2 months to see how she is doing with the titration and if necessary we will switch her to the higher dose pills so that it is easier for her to continue titrating it up.  If she can tolerate that at bedtime dose, then we will consider using some during the day, if she does not feel too sleepy.  However, I doubt that this will be the case since she is extremely sensitive to medications.  I explained to the patient in great detail how to do the titration and what our endpoints would be and she understood and accepted.  She also took some notes about the instructions on how to increase it and what to look for.  Pharmacotherapy Assessment  Analgesic: Tramadol 50 mg, 2 tab PO QD (PRN) (100 mg/day of tramadol) (Ok to use 3/day, PRN) MME/day: 10 mg/day.   Monitoring: [place] PMP: PDMP reviewed during this encounter.       Pharmacotherapy: No side-effects or adverse reactions reported. Compliance: No problems identified. Effectiveness: Clinically acceptable. Plan: Refer to  "POC".  UDS:  Summary  Date Value Ref Range Status  04/11/2018 FINAL  Final    Comment:    ==================================================================== TOXASSURE SELECT 13 (MW) ==================================================================== Test                             Result       Flag       Units Drug Present and Declared for Prescription Verification   Tramadol                       314          EXPECTED   ng/mg creat   O-Desmethyltramadol            758          EXPECTED   ng/mg creat    Source of tramadol is a prescription medication.    O-desmethyltramadol is an expected metabolite of tramadol. ==================================================================== Test                      Result    Flag   Units      Ref Range   Creatinine              50               mg/dL      >=20 ==================================================================== Declared Medications:  The flagging and interpretation on this report are based on the  following declared medications.  Unexpected results may arise  from  inaccuracies in the declared medications.  **Note: The testing scope of this panel includes these medications:  Tramadol (Ultram)  **Note: The testing scope of this panel does not include following  reported medications:  Calcium carbonate  Cyclobenzaprine (Flexeril)  Hydrochlorothiazide (Lisinopril-HCTZ)  Lisinopril (Lisinopril-HCTZ)  Meloxicam  Metformin  Omega-3 Fatty Acids (Lovaza)  Omeprazole  Sertraline (Zoloft)  Vitamin D ==================================================================== For clinical consultation, please call [phone]. ====================================================================    Laboratory Chemistry Profile   Renal Lab Results  Component Value Date   BUN 12 04/21/2018   CREATININE 0.76 04/21/2018   BCR 19 01/11/2017   GFRAA >60 04/21/2018   GFRNONAA >60 04/21/2018    Hepatic Lab Results  Component  Value Date   AST 45 (H) 04/21/2018   ALT 39 04/21/2018   ALBUMIN 4.4 04/21/2018   ALKPHOS 106 04/21/2018    Electrolytes Lab Results  Component Value Date   NA 135 04/21/2018   K 3.7 04/21/2018   CL 102 04/21/2018   CALCIUM 9.4 04/21/2018   MG 2.3 01/11/2017    Bone Lab Results  Component Value Date   25OHVITD1 22 (L) 01/11/2017   25OHVITD2 2.5 01/11/2017   25OHVITD3 19 01/11/2017    Coagulation Lab Results  Component Value Date   PLT 267 04/21/2018    Cardiovascular Lab Results  Component Value Date   TROPONINI <0.03 04/21/2018   HGB 13.0 04/21/2018   HCT 37.8 04/21/2018    Inflammation (CRP: Acute Phase) (ESR: Chronic Phase) Lab Results  Component Value Date   CRP 2.9 01/11/2017   ESRSEDRATE 47 (H) 01/11/2017      Note: Above Lab results reviewed.  Imaging  DG Chest Portable 1 View CLINICAL DATA:  Inhalation injury  EXAM: PORTABLE CHEST 1 VIEW  COMPARISON:  Chest radiograph March 22, 2013 and chest CT April 09, 2013  FINDINGS: Lungs are clear. The heart size and pulmonary vascularity are normal. No adenopathy. There is aortic atherosclerosis. No bone lesions.  IMPRESSION: No edema or consolidation. Stable cardiac silhouette. Aortic Atherosclerosis (ICD10-I70.0).  Electronically Signed   By: Lowella Grip III M.D.   On: 04/21/2018 20:50      Assessment  The primary encounter diagnosis was Chronic pain syndrome. Diagnoses of Cervicalgia (Primary Area of Pain) (Bilateral) (R>L), Chronic shoulder pain (Secondary Area of Pain) (Bilateral) (R>L), Osteoarthritis, Neurogenic pain, Chronic neuropathic pain, Chronic upper extremity pain (Bilateral) (R>L), Pharmacologic therapy, and Problems influencing health status were also pertinent to this visit.  Plan of Care  Problem-specific:  No problem-specific Assessment & Plan notes found for this encounter.  I have changed Alyssa Wood's traMADol. I am also having her start on gabapentin.  Additionally, I am having her maintain her lisinopril-hydrochlorothiazide, omeprazole, Calcium Carbonate-Vitamin D, Cyanocobalamin (VITAMIN B 12 PO), sertraline, metFORMIN, omega-3 acid ethyl esters, and meloxicam.  Pharmacotherapy (Medications Ordered): Meds ordered this encounter  Medications  . traMADol (ULTRAM) 50 MG tablet    Sig: Take 1 tablet (50 mg total) by mouth 2 (two) times daily.    Dispense:  180 tablet    Refill:  1    Chronic Pain: STOP Act (Not applicable) Fill 1 day early if closed on refill date. Do not fill until: 04/07/2019. To last until: 10/04/2019. Avoid benzodiazepines within 8 hours of opioids  . meloxicam (MOBIC) 15 MG tablet    Sig: Take 1 tablet (15 mg total) by mouth daily.    Dispense:  90 tablet    Refill:  1    Fill  one day early if pharmacy is closed on scheduled refill date. May substitute for generic if available.  . gabapentin (NEURONTIN) 100 MG capsule    Sig: Take 1 capsule (100 mg total) by mouth at bedtime for 15 days, THEN 2 capsules (200 mg total) at bedtime for 15 days, THEN 3 capsules (300 mg total) at bedtime. Follow written titration schedule.    Dispense:  225 capsule    Refill:  0    Fill one day early if pharmacy is closed on scheduled refill date. May substitute for generic if available.   Orders:  No orders of the defined types were placed in this encounter.  Follow-up plan:   Return in about 2 months (around 05/19/2019) for (VV), (MM) to evaluate gabapentin titration and the tramadol increase.      Interventional options:  Considering: Diagnosticleft suprascapular NB Possible left suprascapular nerve RFA Diagnostic bilateral cervical facet block Possiblebilateral cervical facet RFA Diagnosticbilateral suprascapular NB Possiblebilateral suprascapular RFA Diagnosticoccipital NB Diagnosticbilateral lumbar facet block Possible bilateral lumbar RFA   PRN Procedures: Palliative right CESI #3     Recent Visits No  visits were found meeting these conditions.  Showing recent visits within past 90 days and meeting all other requirements   Today's Visits Date Type Provider Dept  03/21/19 Telemedicine Milinda Pointer, MD [facility]  Showing today's visits and meeting all other requirements   Future Appointments No visits were found meeting these conditions.  Showing future appointments within next 90 days and meeting all other requirements   I discussed the assessment and treatment plan with the patient. The patient was provided an opportunity to ask questions and all were answered. The patient agreed with the plan and demonstrated an understanding of the instructions.  Patient advised to call back or seek an in-person evaluation if the symptoms or condition worsens.  Duration of encounter: 15 minutes.  Note by: Gaspar Cola, MD Date: 03/21/2019; Time: 11:35 AM

## 2019-03-21 ENCOUNTER — Other Ambulatory Visit: Payer: Self-pay

## 2019-03-21 ENCOUNTER — Ambulatory Visit: Payer: Medicare Other | Attending: Pain Medicine | Admitting: Pain Medicine

## 2019-03-21 DIAGNOSIS — M8949 Other hypertrophic osteoarthropathy, multiple sites: Secondary | ICD-10-CM | POA: Diagnosis not present

## 2019-03-21 DIAGNOSIS — M542 Cervicalgia: Secondary | ICD-10-CM | POA: Diagnosis not present

## 2019-03-21 DIAGNOSIS — M79601 Pain in right arm: Secondary | ICD-10-CM

## 2019-03-21 DIAGNOSIS — M792 Neuralgia and neuritis, unspecified: Secondary | ICD-10-CM | POA: Insufficient documentation

## 2019-03-21 DIAGNOSIS — M159 Polyosteoarthritis, unspecified: Secondary | ICD-10-CM

## 2019-03-21 DIAGNOSIS — Z789 Other specified health status: Secondary | ICD-10-CM | POA: Insufficient documentation

## 2019-03-21 DIAGNOSIS — M25512 Pain in left shoulder: Secondary | ICD-10-CM

## 2019-03-21 DIAGNOSIS — M79602 Pain in left arm: Secondary | ICD-10-CM

## 2019-03-21 DIAGNOSIS — M25511 Pain in right shoulder: Secondary | ICD-10-CM

## 2019-03-21 DIAGNOSIS — G894 Chronic pain syndrome: Secondary | ICD-10-CM | POA: Diagnosis not present

## 2019-03-21 DIAGNOSIS — G8929 Other chronic pain: Secondary | ICD-10-CM

## 2019-03-21 DIAGNOSIS — Z79899 Other long term (current) drug therapy: Secondary | ICD-10-CM

## 2019-03-21 MED ORDER — MELOXICAM 15 MG PO TABS: 15.0000 mg | ORAL_TABLET | Freq: Every day | ORAL | 1 refills | Status: DC

## 2019-03-21 MED ORDER — GABAPENTIN 100 MG PO CAPS: ORAL_CAPSULE | ORAL | 0 refills | Status: DC

## 2019-03-21 MED ORDER — TRAMADOL HCL 50 MG PO TABS: 50.0000 mg | ORAL_TABLET | Freq: Two times a day (BID) | ORAL | 1 refills | Status: DC

## 2019-05-15 ENCOUNTER — Telehealth: Payer: Self-pay

## 2019-05-15 NOTE — Telephone Encounter (Signed)
LM via interpreter to call office for pre virtual appointment questions.

## 2019-05-15 NOTE — Progress Notes (Signed)
Appointment canceled by the patient.

## 2019-05-16 ENCOUNTER — Ambulatory Visit: Payer: Medicare Other | Admitting: Pain Medicine

## 2019-05-16 ENCOUNTER — Other Ambulatory Visit: Payer: Self-pay

## 2019-06-05 ENCOUNTER — Telehealth: Payer: Self-pay

## 2019-06-05 NOTE — Progress Notes (Signed)
Patient: Alyssa Wood  Service Category: E/M  Provider: Gaspar Cola, MD  DOB: 08-31-1960  DOS: 06/06/2019  Location: Office  MRN: [id number]  Setting: Ambulatory outpatient  Referring Provider: [facility] [place]  Type: Established Patient  Specialty: Interventional Pain Management  PCP: [facility] [place]  Location: Remote location  Delivery: TeleHealth     Virtual Encounter - Pain Management PROVIDER NOTE: Information contained herein reflects review and annotations entered in association with encounter. Interpretation of such information and data should be left to medically-trained personnel. Information provided to patient can be located elsewhere in the medical record under "Patient Instructions". Document created using STT-dictation technology, any transcriptional errors that may result from process are unintentional.    Contact & Pharmacy Preferred: [phone] Home: [phone] (home) Mobile: There is no such number on file (mobile). E-mail: [email or web address]  [place], [place] - [address] [place] [address] Phone: [phone] Fax: [phone]   Pre-screening  Alyssa Wood offered "in-person" vs "virtual" encounter. She indicated preferring virtual for this encounter.   Reason COVID-19*  Social distancing based on CDC and AMA recommendations.   I contacted Alyssa Wood on 06/06/2019 via telephone.      I clearly identified myself as Gaspar Cola, MD. I verified that I was speaking with the correct person using two identifiers (Name: Alyssa Wood, and date of birth: April 17, 1960).  Consent I sought verbal advanced consent from Alyssa Wood for virtual visit interactions. I informed Alyssa Wood of possible security and privacy concerns, risks, and limitations associated with providing "not-in-person" medical  evaluation and management services. I also informed Alyssa Wood of the availability of "in-person" appointments. Finally, I informed her that there would be a charge for the virtual visit and that she could be  personally, fully or partially, financially responsible for it. Alyssa Wood expressed understanding and agreed to proceed.   Historic Elements   Alyssa Wood is a 59 y.o. year old, female patient evaluated today after her last contact with our practice on 05/15/2019. Alyssa Wood  has a past medical history of Anxiety, Chronic pain, Depression, Diabetes mellitus without complication ([place]), GERD (gastroesophageal reflux disease), and Hypertension. She also  has a past surgical history that includes Cholecystectomy (1992); Spinal cord stimulator implant (Right, Jan 2013); Shoulder surgery (Right); Wrist surgery (Right); Colonoscopy with propofol (N/A, 08/28/2015); and polypectomy (08/28/2015). Alyssa Wood has a current medication list which includes the following prescription(s): calcium carbonate-vitamin d, clobetasol ointment, cyanocobalamin, lisinopril-hydrochlorothiazide, meloxicam, metformin, omega-3 acid ethyl esters, omeprazole, sertraline, tramadol, and gabapentin. She  reports that she has never smoked. She has never used smokeless tobacco. She reports that she does not drink alcohol or use drugs. Alyssa Wood is allergic to trazodone and nefazodone and hydrocodone.   HPI  Today, she is being contacted for medication management. The patient indicates doing well with the current medication regimen. No adverse reactions or side effects reported to the medications.  The patient indicates being able to tolerate the gabapentin at 300 mg p.o. at bedtime.  She also indicates that seems to be helping considerably with her pain.  However, she still feels that if she takes it late in the evening then she will feel very somnolent the next day.   However, she has gone to taking the medication earlier in the afternoon and this seems to be working well for her.  For now we will stay with the 300 mg at  bedtime and in a couple of months from now will give it a try and increasing it further, as tolerated.  She has enough medication to last until August except for the gabapentin which I will be switching from the 100 mg pill to the 300 mg pill so that she can take only 1 of those at bedtime and that way simplify her regimen.  Pharmacotherapy Assessment  Analgesic: Tramadol 50 mg, 2 tab PO QD (PRN) (100 mg/day of tramadol) (Ok to use 3/day, PRN) MME/day: 10 mg/day.   Monitoring: [place] PMP: PDMP reviewed during this encounter.       Pharmacotherapy: No side-effects or adverse reactions reported. Compliance: No problems identified. Effectiveness: Clinically acceptable. Plan: Refer to "POC".  UDS:  Summary  Date Value Ref Range Status  04/11/2018 FINAL  Final    Comment:    ==================================================================== TOXASSURE SELECT 13 (MW) ==================================================================== Test                             Result       Flag       Units Drug Present and Declared for Prescription Verification   Tramadol                       314          EXPECTED   ng/mg creat   O-Desmethyltramadol            758          EXPECTED   ng/mg creat    Source of tramadol is a prescription medication.    O-desmethyltramadol is an expected metabolite of tramadol. ==================================================================== Test                      Result    Flag   Units      Ref Range   Creatinine              50               mg/dL      >=20 ==================================================================== Declared Medications:  The flagging and interpretation on this report are based on the  following declared medications.  Unexpected results may arise from  inaccuracies in the declared  medications.  **Note: The testing scope of this panel includes these medications:  Tramadol (Ultram)  **Note: The testing scope of this panel does not include following  reported medications:  Calcium carbonate  Cyclobenzaprine (Flexeril)  Hydrochlorothiazide (Lisinopril-HCTZ)  Lisinopril (Lisinopril-HCTZ)  Meloxicam  Metformin  Omega-3 Fatty Acids (Lovaza)  Omeprazole  Sertraline (Zoloft)  Vitamin D ==================================================================== For clinical consultation, please call [phone]. ====================================================================    Laboratory Chemistry Profile   Renal Lab Results  Component Value Date   BUN 12 04/21/2018   CREATININE 0.76 04/21/2018   BCR 19 01/11/2017   GFRAA >60 04/21/2018   GFRNONAA >60 04/21/2018     Hepatic Lab Results  Component Value Date   AST 45 (H) 04/21/2018   ALT 39 04/21/2018   ALBUMIN 4.4 04/21/2018   ALKPHOS 106 04/21/2018     Electrolytes Lab Results  Component Value Date   NA 135 04/21/2018   K 3.7 04/21/2018   CL 102 04/21/2018   CALCIUM 9.4 04/21/2018   MG 2.3 01/11/2017     Bone Lab Results  Component Value Date   25OHVITD1 22 (L) 01/11/2017   25OHVITD2 2.5 01/11/2017   25OHVITD3  19 01/11/2017     Inflammation (CRP: Acute Phase) (ESR: Chronic Phase) Lab Results  Component Value Date   CRP 2.9 01/11/2017   ESRSEDRATE 47 (H) 01/11/2017       Note: Above Lab results reviewed.  Imaging  DG Chest Portable 1 View CLINICAL DATA:  Inhalation injury  EXAM: PORTABLE CHEST 1 VIEW  COMPARISON:  Chest radiograph March 22, 2013 and chest CT April 09, 2013  FINDINGS: Lungs are clear. The heart size and pulmonary vascularity are normal. No adenopathy. There is aortic atherosclerosis. No bone lesions.  IMPRESSION: No edema or consolidation. Stable cardiac silhouette. Aortic Atherosclerosis (ICD10-I70.0).  Electronically Signed   By: Lowella Grip III M.D.   On: 04/21/2018 20:50  Assessment  The primary encounter diagnosis was Chronic pain syndrome. Diagnoses of Cervicalgia (Primary Area of Pain) (Bilateral) (R>L), Chronic shoulder pain (Secondary Area of Pain) (Bilateral) (R>L), Pharmacologic therapy, Neurogenic pain, Chronic neuropathic pain, and Chronic upper extremity pain (Bilateral) (R>L) were also pertinent to this visit.  Plan of Care  Problem-specific:  No problem-specific Assessment & Plan notes found for this encounter.  Alyssa Wood has a current medication list which includes the following long-term medication(s): calcium carbonate-vitamin d, lisinopril-hydrochlorothiazide, meloxicam, metformin, omega-3 acid ethyl esters, omeprazole, tramadol, and gabapentin.  Pharmacotherapy (Medications Ordered): Meds ordered this encounter  Medications  . gabapentin (NEURONTIN) 300 MG capsule    Sig: Take 1 capsule (300 mg total) by mouth at bedtime. Follow written titration schedule    Dispense:  180 capsule    Refill:  0    Fill one day early if pharmacy is closed on scheduled refill date. May substitute for generic if available.   Orders:  Orders Placed This Encounter  Procedures  . ToxASSURE Select 13 (MW), Urine    Volume: 30 ml(s). Minimum 3 ml of urine is needed. Document temperature of fresh sample. Indications: Long term (current) use of opiate analgesic (M62.863)    Order Specific Question:   Release to patient    Answer:   Immediate   Follow-up plan:   Return in about 17 weeks (around 10/03/2019) for (F2F), (MM).      Interventional options:  Considering: Diagnosticleft suprascapular NB Possible left suprascapular nerve RFA Diagnostic bilateral cervical facet block Possiblebilateral cervical facet RFA Diagnosticbilateral suprascapular NB Possiblebilateral suprascapular RFA Diagnosticoccipital NB Diagnosticbilateral lumbar facet block Possible bilateral lumbar RFA    PRN Procedures: Palliative right CESI #3      Recent Visits Date Type Provider Dept  03/21/19 Telemedicine Milinda Pointer, MD [facility]  Showing recent visits within past 90 days and meeting all other requirements   Today's Visits Date Type Provider Dept  06/06/19 Telemedicine Milinda Pointer, MD [facility]  Showing today's visits and meeting all other requirements   Future Appointments No visits were found meeting these conditions.  Showing future appointments within next 90 days and meeting all other requirements   I discussed the assessment and treatment plan with the patient. The patient was provided an opportunity to ask questions and all were answered. The patient agreed with the plan and demonstrated an understanding of the instructions.  Patient advised to call back or seek an in-person evaluation if the symptoms or condition worsens.  Duration of encounter: 13 minutes.  Note by: Gaspar Cola, MD Date: 06/06/2019; Time: 10:46 AM

## 2019-06-05 NOTE — Telephone Encounter (Signed)
Attempted to call patient with assistance of the language line.  Reached patient but she states she was unable to talk at this time and would call us back.

## 2019-06-06 ENCOUNTER — Ambulatory Visit: Payer: Medicare Other | Attending: Pain Medicine | Admitting: Pain Medicine

## 2019-06-06 ENCOUNTER — Other Ambulatory Visit: Payer: Self-pay

## 2019-06-06 ENCOUNTER — Encounter: Payer: Self-pay | Admitting: Pain Medicine

## 2019-06-06 ENCOUNTER — Telehealth: Payer: Self-pay | Admitting: *Deleted

## 2019-06-06 DIAGNOSIS — G894 Chronic pain syndrome: Secondary | ICD-10-CM

## 2019-06-06 DIAGNOSIS — M542 Cervicalgia: Secondary | ICD-10-CM

## 2019-06-06 DIAGNOSIS — Z79899 Other long term (current) drug therapy: Secondary | ICD-10-CM

## 2019-06-06 DIAGNOSIS — M25512 Pain in left shoulder: Secondary | ICD-10-CM

## 2019-06-06 DIAGNOSIS — M25511 Pain in right shoulder: Secondary | ICD-10-CM | POA: Diagnosis not present

## 2019-06-06 DIAGNOSIS — M79601 Pain in right arm: Secondary | ICD-10-CM

## 2019-06-06 DIAGNOSIS — M79602 Pain in left arm: Secondary | ICD-10-CM

## 2019-06-06 DIAGNOSIS — M792 Neuralgia and neuritis, unspecified: Secondary | ICD-10-CM

## 2019-06-06 DIAGNOSIS — G8929 Other chronic pain: Secondary | ICD-10-CM

## 2019-06-06 MED ORDER — GABAPENTIN 300 MG PO CAPS: 300.0000 mg | ORAL_CAPSULE | Freq: Every day | ORAL | 0 refills | Status: DC

## 2019-07-06 LAB — TOXASSURE SELECT 13 (MW), URINE

## 2019-07-17 ENCOUNTER — Telehealth: Payer: Self-pay | Admitting: Pain Medicine

## 2019-07-17 NOTE — Telephone Encounter (Signed)
Returned call. Goes straight to VM. Left message to return call.

## 2019-07-17 NOTE — Telephone Encounter (Signed)
Alyssa Wood [phone]   Needs to clarify script for gabapentin. Please call asap

## 2019-08-16 ENCOUNTER — Other Ambulatory Visit: Payer: Self-pay

## 2019-08-16 ENCOUNTER — Encounter: Payer: Self-pay | Admitting: *Deleted

## 2019-08-16 DIAGNOSIS — Z79899 Other long term (current) drug therapy: Secondary | ICD-10-CM | POA: Diagnosis not present

## 2019-08-16 DIAGNOSIS — Z7984 Long term (current) use of oral hypoglycemic drugs: Secondary | ICD-10-CM | POA: Diagnosis not present

## 2019-08-16 DIAGNOSIS — I10 Essential (primary) hypertension: Secondary | ICD-10-CM | POA: Insufficient documentation

## 2019-08-16 DIAGNOSIS — E119 Type 2 diabetes mellitus without complications: Secondary | ICD-10-CM | POA: Insufficient documentation

## 2019-08-16 DIAGNOSIS — Z86010 Personal history of colonic polyps: Secondary | ICD-10-CM | POA: Insufficient documentation

## 2019-08-16 LAB — CBC
HCT: 35 % — ABNORMAL LOW (ref 36.0–46.0)
Hemoglobin: 11.9 g/dL — ABNORMAL LOW (ref 12.0–15.0)
MCH: 27.7 pg (ref 26.0–34.0)
MCHC: 34 g/dL (ref 30.0–36.0)
MCV: 81.4 fL (ref 80.0–100.0)
Platelets: 253 10*3/uL (ref 150–400)
RBC: 4.3 MIL/uL (ref 3.87–5.11)
RDW: 12.8 % (ref 11.5–15.5)
WBC: 8.4 10*3/uL (ref 4.0–10.5)
nRBC: 0 % (ref 0.0–0.2)

## 2019-08-16 LAB — BASIC METABOLIC PANEL
Anion gap: 10 (ref 5–15)
BUN: 18 mg/dL (ref 8–23)
CO2: 29 mmol/L (ref 22–32)
Calcium: 9.6 mg/dL (ref 8.9–10.3)
Chloride: 96 mmol/L — ABNORMAL LOW (ref 98–111)
Creatinine, Ser: 0.78 mg/dL (ref 0.44–1.00)
GFR calc Af Amer: >60 mL/min (ref >60)
GFR calc non Af Amer: >60 mL/min (ref >60)
Glucose, Bld: 131 mg/dL — ABNORMAL HIGH (ref 70–99)
Potassium: 4.6 mmol/L (ref 3.5–5.1)
Sodium: 135 mmol/L (ref 135–145)

## 2019-08-16 LAB — TROPONIN I (HIGH SENSITIVITY): Troponin I (High Sensitivity): 3 ng/L (ref <18)

## 2019-08-16 NOTE — ED Triage Notes (Signed)
Pt reports hypertension since yesterday.  Pt is on blood pressure meds.  Today pt has nausea.  No chest pain.  Pt alert  Speech clear.

## 2019-08-17 ENCOUNTER — Emergency Department
Admission: EM | Admit: 2019-08-17 | Discharge: 2019-08-17 | Disposition: A | Discharge status: Home / Self Care | Payer: Medicare Other | Attending: Emergency Medicine | Admitting: Emergency Medicine

## 2019-08-17 DIAGNOSIS — I10 Essential (primary) hypertension: Secondary | ICD-10-CM

## 2019-08-17 LAB — TROPONIN I (HIGH SENSITIVITY): Troponin I (High Sensitivity): 3 ng/L (ref <18)

## 2019-08-17 MED ORDER — AMLODIPINE BESYLATE 2.5 MG PO TABS
2.5000 mg | ORAL_TABLET | Freq: Every day | ORAL | 0 refills | Status: DC
Start: 2019-08-17 — End: 2021-02-15

## 2019-08-17 MED ORDER — HYDROXYZINE HCL 25 MG PO TABS
25.0000 mg | ORAL_TABLET | Freq: Three times a day (TID) | ORAL | 0 refills | Status: DC | PRN
Start: 2019-08-17 — End: 2021-02-15

## 2019-08-17 MED ORDER — LORAZEPAM 0.5 MG PO TABS
0.5000 mg | ORAL_TABLET | Freq: Once | ORAL | Status: AC
  Administered 2019-08-17: 0.5 mg via ORAL
  Filled 2019-08-17: qty 1
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
## 2019-08-23 ENCOUNTER — Other Ambulatory Visit: Payer: Self-pay | Admitting: Physician Assistant

## 2019-08-23 DIAGNOSIS — Z1231 Encounter for screening mammogram for malignant neoplasm of breast: Secondary | ICD-10-CM

## 2019-08-27 IMAGING — CT CT HEAD W/O CM
3 series · 16 of 47 positions shown, 19 images · non-contrast
Comparison: Head CT dated 01/27/2010

CLINICAL DATA: 56-year-old female with headache.

EXAM:
CT HEAD WITHOUT CONTRAST
TECHNIQUE: Contiguous axial images were obtained from the base of the skull
through the vertex without intravenous contrast.

[Series 3: head wo · axial · 0.40mm/px · z∈[-85,+40]mm · 10 of 30 slices shown, 13 images]
[im 3/30  brain]
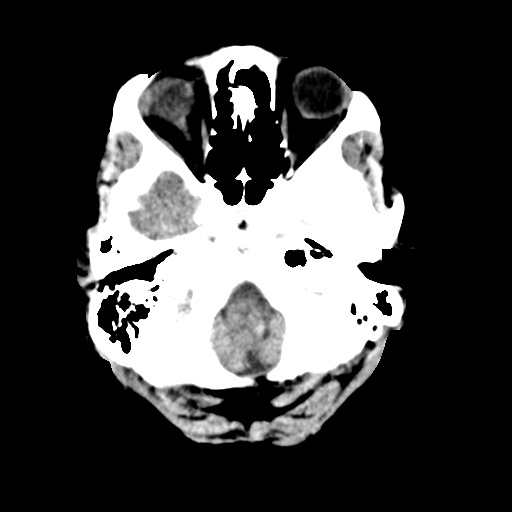
[im 3/30  bone]
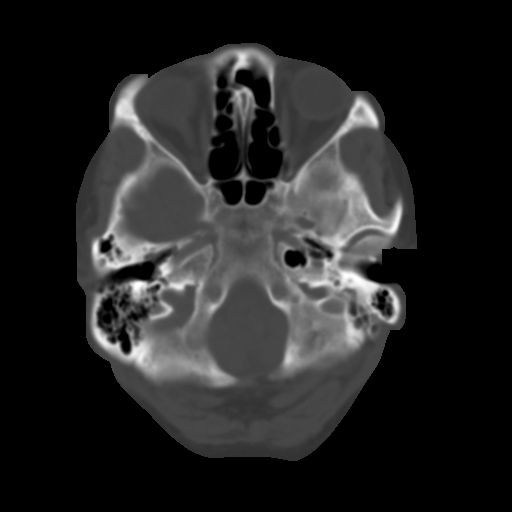
[im 6/30  brain]
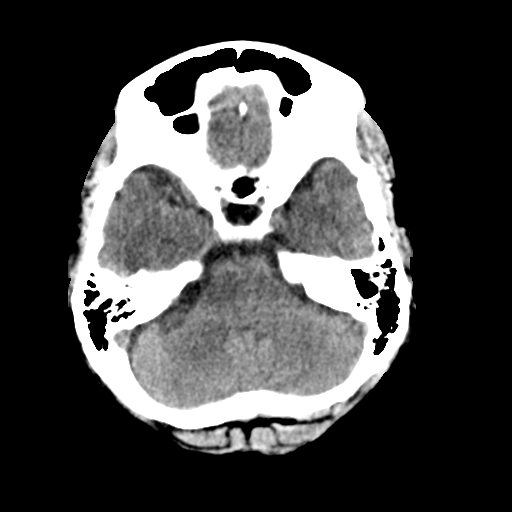
[im 9/30  brain]
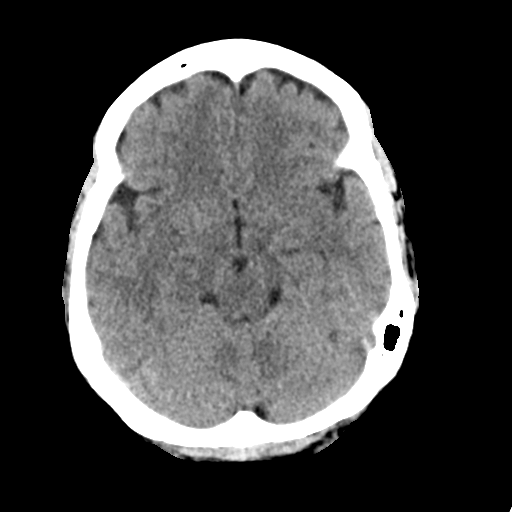
[im 11/30  brain]
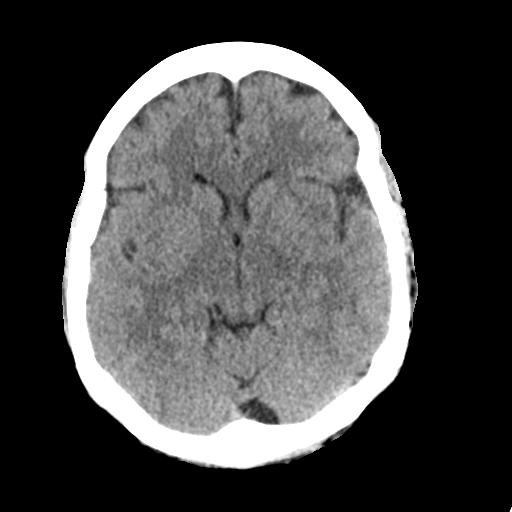
[im 14/30  brain]
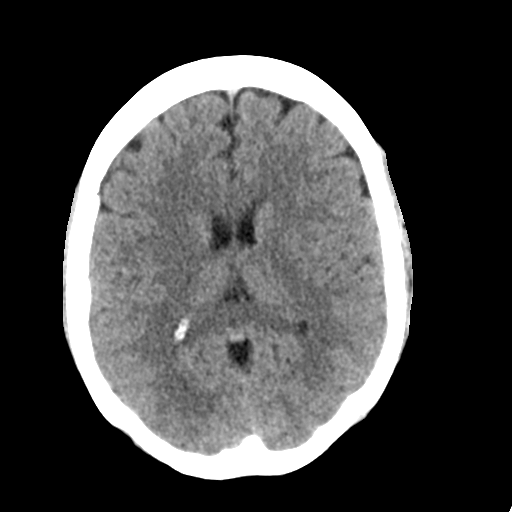
[im 14/30  bone]
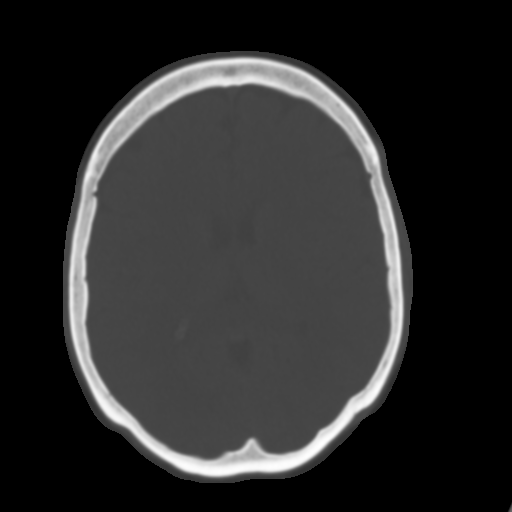
[im 17/30  brain]
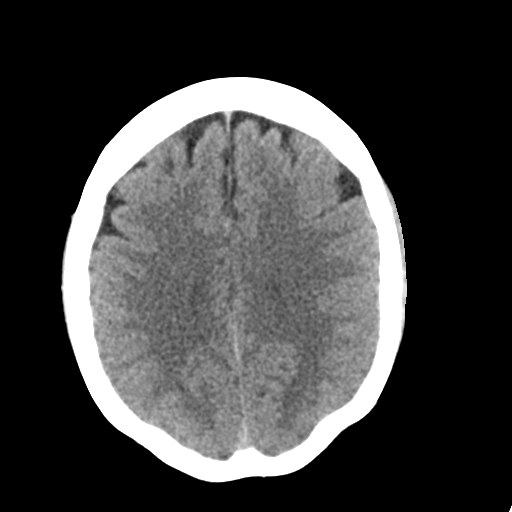
[im 20/30  brain]
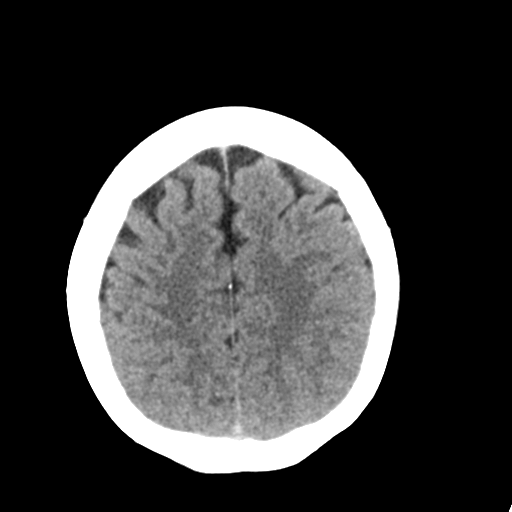
[im 23/30  brain]
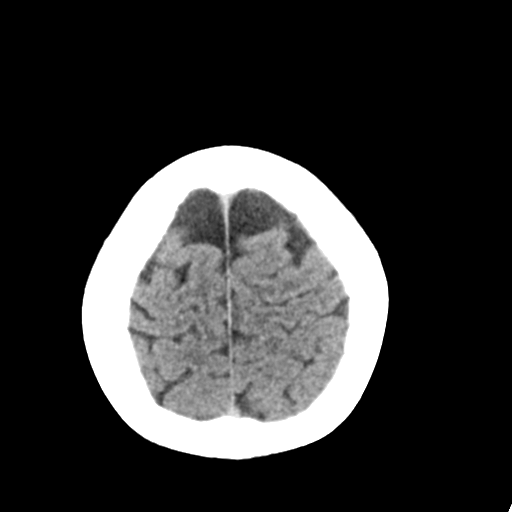
[im 25/30  brain]
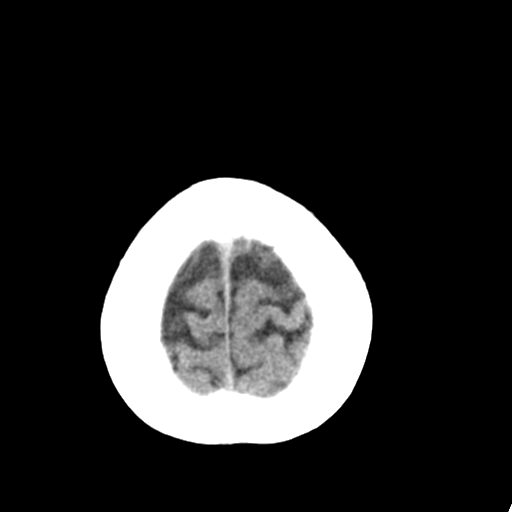
[im 25/30  bone]
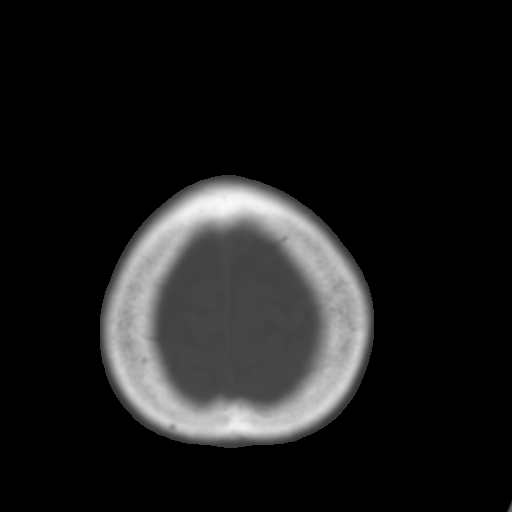
[im 28/30  brain]
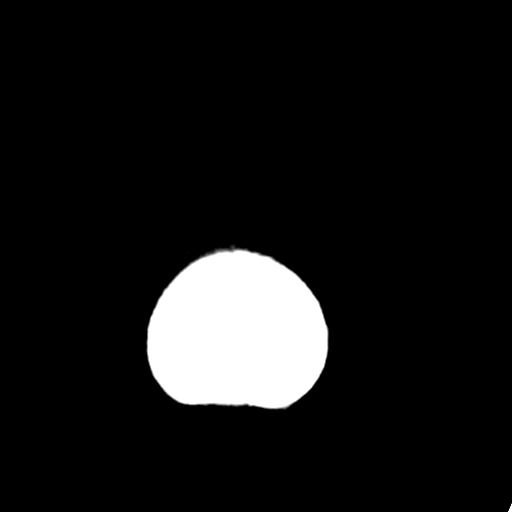

[Series 4: coronal soft tissue · coronal · 0.29mm/px · 3 of 59 slices shown]
[im 20/59  brain]
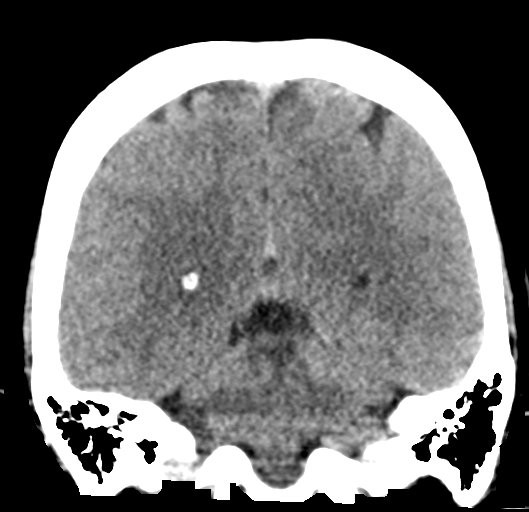
[im 26/59  brain]
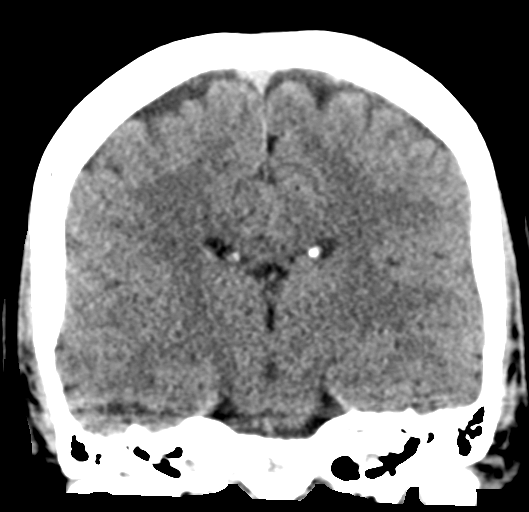
[im 33/59  brain]
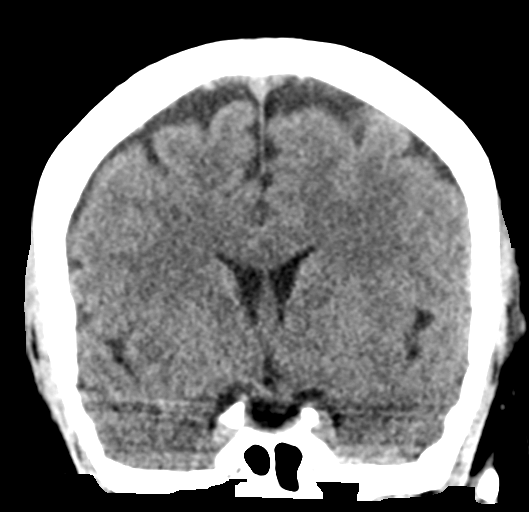

[Series 5: sagittal soft tissue · sagittal · 0.29mm/px · 3 of 51 slices shown]
[im 17/51  brain]
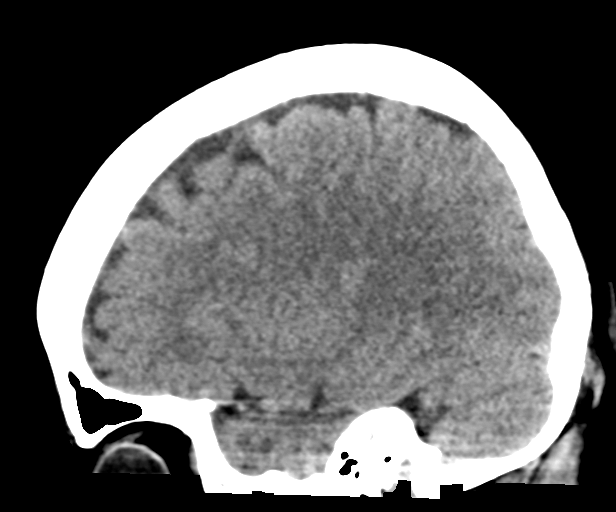
[im 26/51  brain]
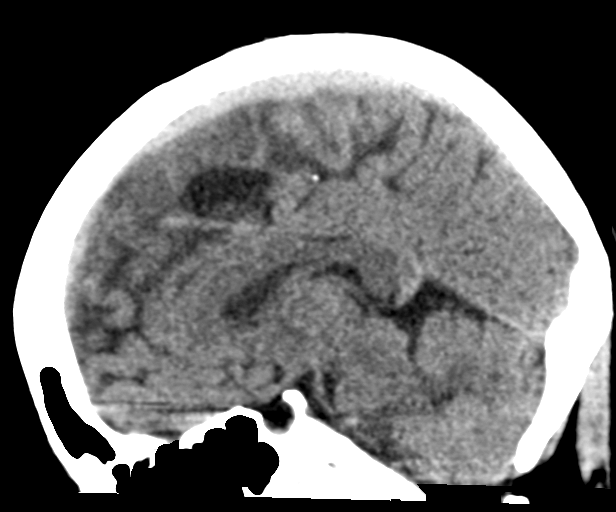
[im 34/51  brain]
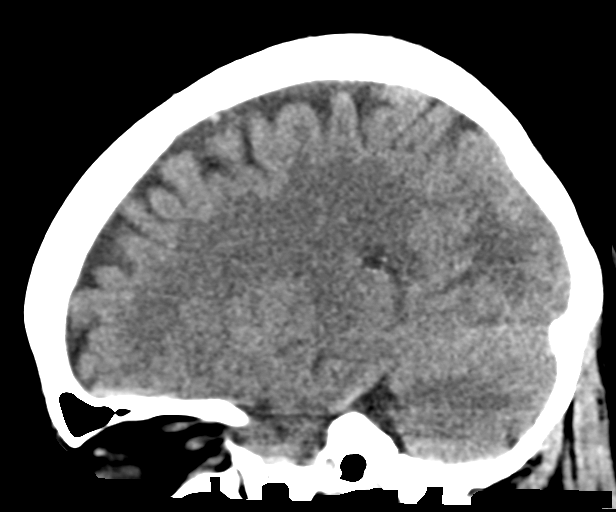

[16 of 47 positions shown; findings below may reference images not displayed]

FINDINGS: Brain: There is mild cortical volume loss. The gray-white matter
discrimination is preserved. There is no acute intracranial
hemorrhage. No mass effect or midline shift noted. No extra-axial
fluid collection.

Vascular: No hyperdense vessel or unexpected calcification.

Skull: Normal. Negative for fracture or focal lesion.

Sinuses/Orbits: No acute finding.

Other: None
IMPRESSION: 1. No acute intracranial hemorrhage.
2. Mild diffuse cortical atrophy. If symptoms persist, and there are
no contraindications, MRI may provide better evaluation if
clinically indicated.

## 2019-09-23 NOTE — Progress Notes (Signed)
This patient's chart is under "My Open Charts". These are cancelled appointments that keep popping into my "In Basket" as a deficiency. See what you can do to remove them.   Thank you. 

## 2019-10-01 ENCOUNTER — Ambulatory Visit: Payer: Medicare Other | Admitting: Pain Medicine

## 2019-10-02 ENCOUNTER — Ambulatory Visit: Payer: Medicare Other | Attending: Pain Medicine | Admitting: Pain Medicine

## 2019-10-02 ENCOUNTER — Other Ambulatory Visit: Payer: Self-pay

## 2019-10-02 ENCOUNTER — Ambulatory Visit: Payer: Medicare Other | Admitting: Pain Medicine

## 2019-10-02 DIAGNOSIS — M542 Cervicalgia: Secondary | ICD-10-CM | POA: Diagnosis not present

## 2019-10-02 DIAGNOSIS — M792 Neuralgia and neuritis, unspecified: Secondary | ICD-10-CM

## 2019-10-02 DIAGNOSIS — M159 Polyosteoarthritis, unspecified: Secondary | ICD-10-CM

## 2019-10-02 DIAGNOSIS — G4486 Cervicogenic headache: Secondary | ICD-10-CM

## 2019-10-02 DIAGNOSIS — G8929 Other chronic pain: Secondary | ICD-10-CM

## 2019-10-02 DIAGNOSIS — M8949 Other hypertrophic osteoarthropathy, multiple sites: Secondary | ICD-10-CM

## 2019-10-02 DIAGNOSIS — M545 Low back pain, unspecified: Secondary | ICD-10-CM

## 2019-10-02 DIAGNOSIS — M25512 Pain in left shoulder: Secondary | ICD-10-CM

## 2019-10-02 DIAGNOSIS — M79602 Pain in left arm: Secondary | ICD-10-CM

## 2019-10-02 DIAGNOSIS — M79601 Pain in right arm: Secondary | ICD-10-CM

## 2019-10-02 DIAGNOSIS — M25511 Pain in right shoulder: Secondary | ICD-10-CM | POA: Diagnosis not present

## 2019-10-02 DIAGNOSIS — R519 Headache, unspecified: Secondary | ICD-10-CM | POA: Diagnosis not present

## 2019-10-02 DIAGNOSIS — M150 Primary generalized (osteo)arthritis: Secondary | ICD-10-CM

## 2019-10-02 DIAGNOSIS — G894 Chronic pain syndrome: Secondary | ICD-10-CM | POA: Diagnosis not present

## 2019-10-02 MED ORDER — GABAPENTIN 300 MG PO CAPS: 300.0000 mg | ORAL_CAPSULE | Freq: Every day | ORAL | 0 refills | Status: DC

## 2019-10-02 MED ORDER — TRAMADOL HCL 50 MG PO TABS: 50.0000 mg | ORAL_TABLET | Freq: Two times a day (BID) | ORAL | 1 refills | Status: DC

## 2019-10-02 MED ORDER — MELOXICAM 15 MG PO TABS: 15.0000 mg | ORAL_TABLET | Freq: Every day | ORAL | 1 refills | Status: DC

## 2019-10-02 NOTE — Progress Notes (Signed)
Patient: Alyssa Wood  Service Category: E/M  Provider: Gaspar Cola, MD  DOB: [birth year]  DOS: 10/02/2019  Location: Office  MRN: [id number]  Setting: Ambulatory outpatient  Referring Provider: [facility], [facility]  Type: Established Patient  Specialty: Interventional Pain Management  PCP: [facility], [place]  Location: Remote location  Delivery: TeleHealth     Virtual Encounter - Pain Management PROVIDER NOTE: Information contained herein reflects review and annotations entered in association with encounter. Interpretation of such information and data should be left to medically-trained personnel. Information provided to patient can be located elsewhere in the medical record under "Patient Instructions". Document created using STT-dictation technology, any transcriptional errors that may result from process are unintentional.    Contact & Pharmacy Preferred: [phone] Home: [phone] (home) Mobile: There is no such number on file (mobile). E-mail: [email or web address] .[facility] [place], [place] [address] [place] [address] Phone: [phone] Fax: [phone]   Pre-screening  Alyssa Wood offered "in-person" vs "virtual" encounter. She indicated preferring virtual for this encounter.   Reason COVID-19*  Social distancing based on CDC and AMA recommendations.   I contacted Alyssa Wood on 10/02/2019 via telephone.      I clearly identified myself as Gaspar Cola, MD. I verified that I was speaking with the correct person using two identifiers (Name: Alyssa Wood, and date of birth: Mar 31, 1960).  Consent I sought verbal advanced consent from Alyssa Wood for virtual visit interactions. I informed Alyssa Wood of possible security and privacy concerns, risks, and limitations associated with providing "not-in-person" medical  evaluation and management services. I also informed Alyssa Wood of the availability of "in-person" appointments. Finally, I informed her that there would be a charge for the virtual visit and that she could be  personally, fully or partially, financially responsible for it. Alyssa Wood expressed understanding and agreed to proceed.   Historic Elements   Alyssa Wood is a 59 y.o. year old, female patient evaluated today after her last contact with our practice on 07/17/2019. Alyssa Wood  has a past medical history of Anxiety, Chronic pain, Depression, Diabetes mellitus without complication ([place]), GERD (gastroesophageal reflux disease), and Hypertension. She also  has a past surgical history that includes Cholecystectomy (1992); Spinal cord stimulator implant (Right, Jan 2013); Shoulder surgery (Right); Wrist surgery (Right); Colonoscopy with propofol (N/A, 08/28/2015); and polypectomy (08/28/2015). Alyssa Wood has a current medication list which includes the following prescription(s): calcium carbonate-vitamin d, clobetasol ointment, cyanocobalamin, [START ON 12/03/2019] gabapentin, hydroxyzine, lisinopril-hydrochlorothiazide, [START ON 10/04/2019] meloxicam, metformin, omega-3 acid ethyl esters, omeprazole, sertraline, [START ON 10/04/2019] tramadol, and amlodipine. She  reports that she has never smoked. She has never used smokeless tobacco. She reports that she does not drink alcohol and does not use drugs. Alyssa Wood is allergic to trazodone and nefazodone and hydrocodone.   HPI  Today, she is being contacted for medication management.  Pharmacotherapy Assessment  Analgesic: Tramadol 50 mg, 2 tab PO QD (PRN) (100 mg/day of tramadol) (Ok to use 3/day, PRN) MME/day: 10 mg/day.   Monitoring: [place] PMP: PDMP reviewed during this encounter.       Pharmacotherapy: No side-effects or adverse reactions reported. Compliance: No problems  identified. Effectiveness: Clinically acceptable. Plan: Refer to "POC".  UDS:  Summary  Date Value Ref Range Status  07/03/2019 Note  Final    Comment:    ==================================================================== Tox[employer] Select 13 (MW) ==================================================================== Test  Result       Flag       Units Drug Present and Declared for Prescription Verification   Tramadol                       >6024        EXPECTED   ng/mg creat   O-Desmethyltramadol            >6024        EXPECTED   ng/mg creat   N-Desmethyltramadol            541          EXPECTED   ng/mg creat    Source of tramadol is a prescription medication. O-desmethyltramadol    and N-desmethyltramadol are expected metabolites of tramadol. ==================================================================== Test                      Result    Flag   Units      Ref Range   Creatinine              83               mg/dL      >=20 ==================================================================== Declared Medications:  The flagging and interpretation on this report are based on the  following declared medications.  Unexpected results may arise from  inaccuracies in the declared medications.  **Note: The testing scope of this panel includes these medications:  Tramadol (Ultram)  **Note: The testing scope of this panel does not include the  following reported medications:  Calcium  Cyanocobalamin  Gabapentin (Neurontin)  Hydrochlorothiazide (Zestoretic)  Lisinopril (Zestoretic)  Meloxicam (Mobic)  Metformin (Glucophage)  Omega-3 Fatty Acids  Omeprazole (Prilosec)  Sertraline (Zoloft)  Topical  Vitamin D ==================================================================== For clinical consultation, please call [phone]. ====================================================================     Laboratory Chemistry Profile   Renal Lab  Results  Component Value Date   BUN 18 08/16/2019   CREATININE 0.78 08/16/2019   BCR 19 01/11/2017   GFRAA >60 08/16/2019   GFRNONAA >60 08/16/2019     Hepatic Lab Results  Component Value Date   AST 45 (H) 04/21/2018   ALT 39 04/21/2018   ALBUMIN 4.4 04/21/2018   ALKPHOS 106 04/21/2018     Electrolytes Lab Results  Component Value Date   NA 135 08/16/2019   K 4.6 08/16/2019   CL 96 (L) 08/16/2019   CALCIUM 9.6 08/16/2019   MG 2.3 01/11/2017     Bone Lab Results  Component Value Date   25OHVITD1 22 (L) 01/11/2017   25OHVITD2 2.5 01/11/2017   25OHVITD3 19 01/11/2017     Inflammation (CRP: Acute Phase) (ESR: Chronic Phase) Lab Results  Component Value Date   CRP 2.9 01/11/2017   ESRSEDRATE 47 (H) 01/11/2017       Note: Above Lab results reviewed.   Imaging  DG Chest Portable 1 View CLINICAL DATA:  Inhalation injury  EXAM: PORTABLE CHEST 1 VIEW  COMPARISON:  Chest radiograph March 22, 2013 and chest CT April 09, 2013  FINDINGS: Lungs are clear. The heart size and pulmonary vascularity are normal. No adenopathy. There is aortic atherosclerosis. No bone lesions.  IMPRESSION: No edema or consolidation. Stable cardiac silhouette. Aortic Atherosclerosis (ICD10-I70.0).  Electronically Signed   By: Lowella Grip III M.D.   On: 04/21/2018 20:50  Assessment  The primary encounter diagnosis was Chronic pain syndrome. Diagnoses of Cervicalgia (Primary Area of Pain) (Bilateral) (R>L), Chronic shoulder  pain (Secondary Area of Pain) (Bilateral) (R>L), Cervicogenic headache (Tertiary Area of Pain), Chronic low back pain (Fourth Area of Pain) (Bilateral) (L>R), Osteoarthritis, Neurogenic pain, Chronic neuropathic pain, and Chronic upper extremity pain (Bilateral) (R>L) were also pertinent to this visit.  Plan of Care  Problem-specific:  No problem-specific Assessment & Plan notes found for this encounter.  Alyssa Wood has a current  medication list which includes the following long-term medication(s): calcium carbonate-vitamin d, [START ON 12/03/2019] gabapentin, lisinopril-hydrochlorothiazide, [START ON 10/04/2019] meloxicam, metformin, omega-3 acid ethyl esters, omeprazole, [START ON 10/04/2019] tramadol, and amlodipine.  Pharmacotherapy (Medications Ordered): Meds ordered this encounter  Medications  . traMADol (ULTRAM) 50 MG tablet    Sig: Take 1 tablet (50 mg total) by mouth 2 (two) times daily.    Dispense:  180 tablet    Refill:  1    Chronic Pain: STOP Act (Not applicable) Fill 1 day early if closed on refill date. Do not fill until: 10/04/2019. To last until: 04/01/2020. Avoid benzodiazepines within 8 hours of opioids  . meloxicam (MOBIC) 15 MG tablet    Sig: Take 1 tablet (15 mg total) by mouth daily.    Dispense:  90 tablet    Refill:  1    Fill one day early if pharmacy is closed on scheduled refill date. May substitute for generic if available.  . gabapentin (NEURONTIN) 300 MG capsule    Sig: Take 1 capsule (300 mg total) by mouth at bedtime. Follow written titration schedule    Dispense:  120 capsule    Refill:  0    Fill one day early if pharmacy is closed on scheduled refill date. May substitute for generic if available.   Orders:  No orders of the defined types were placed in this encounter.  Follow-up plan:   Return in about 6 months (around 03/31/2020) for (20-min), (F2F), (Med Mgmt).      Interventional options:  Considering: Diagnosticleft suprascapular NB Possible left suprascapular nerve RFA Diagnostic bilateral cervical facet block Possiblebilateral cervical facet RFA Diagnosticbilateral suprascapular NB Possiblebilateral suprascapular RFA Diagnosticoccipital NB Diagnosticbilateral lumbar facet block Possible bilateral lumbar RFA   PRN Procedures: Palliative right CESI #3       Recent Visits No visits were found meeting these conditions. Showing recent visits  within past 90 days and meeting all other requirements Today's Visits Date Type Provider Dept  10/02/19 Telemedicine Milinda Pointer, MD [facility]  Showing today's visits and meeting all other requirements Future Appointments No visits were found meeting these conditions. Showing future appointments within next 90 days and meeting all other requirements  I discussed the assessment and treatment plan with the patient. The patient was provided an opportunity to ask questions and all were answered. The patient agreed with the plan and demonstrated an understanding of the instructions.  Patient advised to call back or seek an in-person evaluation if the symptoms or condition worsens.  Duration of encounter: 13 minutes.  Note by: Gaspar Cola, MD Date: 10/02/2019; Time: 4:49 PM

## 2019-11-21 ENCOUNTER — Encounter: Payer: Self-pay | Admitting: Internal Medicine

## 2019-12-17 ENCOUNTER — Encounter: Payer: Self-pay | Admitting: *Deleted

## 2020-01-28 ENCOUNTER — Telehealth: Payer: Self-pay

## 2020-01-28 NOTE — Telephone Encounter (Signed)
Spoke with pharmacist at [facility].  She states that they will be open on the 23rd and then closed until the 27th.  Instructed that they could fill her medications on the 23rd, but they would have to last the entire time.

## 2020-01-28 NOTE — Telephone Encounter (Signed)
The pharmacy will be closed from 12/23-12/27 so they want her to get her meds filled 4 days early. Is that ok? She is due on 12/26 It will have to be filled on 12/22

## 2020-01-29 ENCOUNTER — Telehealth: Payer: Self-pay | Admitting: Pain Medicine

## 2020-01-29 NOTE — Telephone Encounter (Signed)
Pharmacy called stating they will be closed 12-23 to 12-27 / patient meds due to fill on 12-26, wanting to know if they can fill patients med on 01-30-20/ please call [phone]

## 2020-03-03 NOTE — Progress Notes (Signed)
Patient: Alyssa Wood  Service Category: E/M  Provider: Gaspar Cola, MD  DOB: 1960-08-29  DOS: 03/05/2020  Location: Office  MRN: [id number]  Setting: Ambulatory outpatient  Referring Provider: [facility] [place]  Type: Established Patient  Specialty: Interventional Pain Management  PCP: [facility] [place]  Location: Remote location  Delivery: TeleHealth     Virtual Encounter - Pain Management PROVIDER NOTE: Information contained herein reflects review and annotations entered in association with encounter. Interpretation of such information and data should be left to medically-trained personnel. Information provided to patient can be located elsewhere in the medical record under "Patient Instructions". Document created using STT-dictation technology, any transcriptional errors that may result from process are unintentional.    Contact & Pharmacy Preferred: [phone] Home: [phone] (home) Mobile: There is no such number on file (mobile). E-mail: [email or web address]  [place] [place] - [address] [place] [address] Phone: [phone] Fax: [phone]   Pre-screening  Alyssa Wood offered "in-person" vs "virtual" encounter. She indicated preferring virtual for this encounter.   Reason COVID-19*  Social distancing based on CDC and AMA recommendations.   I contacted Alyssa Wood on 03/05/2020 via telephone.      I clearly identified myself as Gaspar Cola, MD. I verified that I was speaking with the correct person using two identifiers (Name: Alyssa Wood, and date of birth: 1960/08/12).  Consent I sought verbal advanced consent from Alyssa Wood for virtual visit interactions. I informed Alyssa Wood of possible security and privacy concerns, risks, and limitations associated with providing "not-in-person" medical  evaluation and management services. I also informed Alyssa Wood of the availability of "in-person" appointments. Finally, I informed her that there would be a charge for the virtual visit and that she could be  personally, fully or partially, financially responsible for it. Alyssa Wood expressed understanding and agreed to proceed.   Historic Elements   Alyssa Wood is a 60 y.o. year old, female patient evaluated today after our last contact on 01/29/2020. Alyssa Wood  has a past medical history of Anxiety, Chronic pain, Depression, Diabetes mellitus without complication ([place]), GERD (gastroesophageal reflux disease), and Hypertension. She also  has a past surgical history that includes Cholecystectomy (1992); Spinal cord stimulator implant (Right, Jan 2013); Shoulder surgery (Right); Wrist surgery (Right); Colonoscopy with propofol (N/A, 08/28/2015); and polypectomy (08/28/2015). Alyssa Wood has a current medication list which includes the following prescription(s): calcium carbonate-vitamin d, clobetasol ointment, cyanocobalamin, [START ON 04/01/2020] gabapentin, hydroxyzine, lisinopril-hydrochlorothiazide, [START ON 04/01/2020] meloxicam, metformin, omega-3 acid ethyl esters, omeprazole, sertraline, amlodipine, and [START ON 04/01/2020] tramadol. She  reports that she has never smoked. She has never used smokeless tobacco. She reports that she does not drink alcohol and does not use drugs. Alyssa Wood is allergic to trazodone and nefazodone and hydrocodone.   HPI  Today, she is being contacted for medication management.  Today the patient was scheduled for a face-to-face medication management encounter, but she called indicating that she was sick.  We offered her to change the visit to a virtual visit and she accepted.  The patient indicates doing well with the current medication regimen. No adverse reactions or side effects reported to the  medications.  The patient indicated having identified certain things that she does that cause pain.  Because of this, I instructed the patient on the Preemptive Analgesia using over-the-counter ibuprofen.  I stressed to her that she needs to take  it with some food and that she should only do it once, on the day that she is planning on doing that painful activity.  I also stressed to her that she should not be taking that every day.  She wrote it down, understood, and indicated that she would give it a try.  RTCB: 06/30/2020 Nonopioids transferred 03/05/2020: Neurontin and Mobic  Pharmacotherapy Assessment  Analgesic: Tramadol 50 mg, 2 tab PO QD (PRN) (100 mg/day of tramadol) (Ok to use 3/day, PRN) MME/day: 10 mg/day.   Monitoring: [place] PMP: PDMP reviewed during this encounter.       Pharmacotherapy: No side-effects or adverse reactions reported. Compliance: No problems identified. Effectiveness: Clinically acceptable. Plan: Refer to "POC".  UDS:  Summary  Date Value Ref Range Status  07/03/2019 Note  Final    Comment:    ==================================================================== [employer] Select 13 (MW) ==================================================================== Test                             Result       Flag       Units Drug Present and Declared for Prescription Verification   Tramadol                       >6024        EXPECTED   ng/mg creat   O-Desmethyltramadol            >6024        EXPECTED   ng/mg creat   N-Desmethyltramadol            541          EXPECTED   ng/mg creat    Source of tramadol is a prescription medication. O-desmethyltramadol    and N-desmethyltramadol are expected metabolites of tramadol. ==================================================================== Test                      Result    Flag   Units      Ref Range   Creatinine              83               mg/dL       >=20 ==================================================================== Declared Medications:  The flagging and interpretation on this report are based on the  following declared medications.  Unexpected results may arise from  inaccuracies in the declared medications.  **Note: The testing scope of this panel includes these medications:  Tramadol (Ultram)  **Note: The testing scope of this panel does not include the  following reported medications:  Calcium  Cyanocobalamin  Gabapentin (Neurontin)  Hydrochlorothiazide (Zestoretic)  Lisinopril (Zestoretic)  Meloxicam (Mobic)  Metformin (Glucophage)  Omega-3 Fatty Acids  Omeprazole (Prilosec)  Sertraline (Zoloft)  Topical  Vitamin D ==================================================================== For clinical consultation, please call [phone]. ====================================================================     Laboratory Chemistry Profile   Renal Lab Results  Component Value Date   BUN 18 08/16/2019   CREATININE 0.78 08/16/2019   BCR 19 01/11/2017   GFRAA >60 08/16/2019   GFRNONAA >60 08/16/2019     Hepatic Lab Results  Component Value Date   AST 45 (H) 04/21/2018   ALT 39 04/21/2018   ALBUMIN 4.4 04/21/2018   ALKPHOS 106 04/21/2018     Electrolytes Lab Results  Component Value Date   NA 135 08/16/2019   K 4.6 08/16/2019   CL 96 (L) 08/16/2019   CALCIUM  9.6 08/16/2019   MG 2.3 01/11/2017     Bone Lab Results  Component Value Date   25OHVITD1 22 (L) 01/11/2017   25OHVITD2 2.5 01/11/2017   25OHVITD3 19 01/11/2017     Inflammation (CRP: Acute Phase) (ESR: Chronic Phase) Lab Results  Component Value Date   CRP 2.9 01/11/2017   ESRSEDRATE 47 (H) 01/11/2017       Note: Above Lab results reviewed.  Imaging  DG Chest Portable 1 View CLINICAL DATA:  Inhalation injury  EXAM: PORTABLE CHEST 1 VIEW  COMPARISON:  Chest radiograph March 22, 2013 and chest CT April 09, 2013  FINDINGS: Lungs are clear. The heart size and pulmonary vascularity are normal. No adenopathy. There is aortic atherosclerosis. No bone lesions.  IMPRESSION: No edema or consolidation. Stable cardiac silhouette. Aortic Atherosclerosis (ICD10-I70.0).  Electronically Signed   By: Lowella Grip III M.D.   On: 04/21/2018 20:50  Assessment  The primary encounter diagnosis was Cervicalgia (Primary Area of Pain) (Bilateral) (R>L). Diagnoses of Chronic pain syndrome, Chronic shoulder pain (Secondary Area of Pain) (Bilateral) (R>L), Cervicogenic headache (Tertiary Area of Pain), Chronic low back pain (Fourth Area of Pain) (Bilateral) (L>R), Chronic upper extremity pain (Bilateral) (R>L), Pharmacologic therapy, and Uncomplicated opioid dependence (HCC) were also pertinent to this visit.  Plan of Care  Problem-specific:  No problem-specific Assessment & Plan notes found for this encounter.  Alyssa Wood has a current medication list which includes the following long-term medication(s): calcium carbonate-vitamin d, [START ON 04/01/2020] gabapentin, lisinopril-hydrochlorothiazide, [START ON 04/01/2020] meloxicam, metformin, omega-3 acid ethyl esters, omeprazole, amlodipine, and [START ON 04/01/2020] tramadol.  Pharmacotherapy (Medications Ordered): Meds ordered this encounter  Medications  . traMADol (ULTRAM) 50 MG tablet    Sig: Take 1 tablet (50 mg total) by mouth 2 (two) times daily.    Dispense:  180 tablet    Refill:  0    Chronic Pain: STOP Act (Not applicable) Fill 1 day early if closed on refill date. Avoid benzodiazepines within 8 hours of opioids   Orders:  No orders of the defined types were placed in this encounter.  Follow-up plan:   No follow-ups on file.      Interventional options:  Considering: Diagnosticleft suprascapular NB Possible left suprascapular nerve RFA Diagnostic bilateral cervical facet block Possiblebilateral cervical facet  RFA Diagnosticbilateral suprascapular NB Possiblebilateral suprascapular RFA Diagnosticoccipital NB Diagnosticbilateral lumbar facet block Possible bilateral lumbar RFA   PRN Procedures: Palliative right CESI #3     Recent Visits No visits were found meeting these conditions. Showing recent visits within past 90 days and meeting all other requirements Today's Visits Date Type Provider Dept  03/05/20 Telemedicine Milinda Pointer, MD [facility]  Showing today's visits and meeting all other requirements Future Appointments No visits were found meeting these conditions. Showing future appointments within next 90 days and meeting all other requirements  I discussed the assessment and treatment plan with the patient. The patient was provided an opportunity to ask questions and all were answered. The patient agreed with the plan and demonstrated an understanding of the instructions.  Patient advised to call back or seek an in-person evaluation if the symptoms or condition worsens.  Duration of encounter: 15 minutes.  Note by: Gaspar Cola, MD Date: 03/05/2020; Time: 8:56 AM

## 2020-03-05 ENCOUNTER — Other Ambulatory Visit: Payer: Self-pay | Admitting: Pain Medicine

## 2020-03-05 ENCOUNTER — Ambulatory Visit: Payer: Medicare HMO | Attending: Pain Medicine | Admitting: Pain Medicine

## 2020-03-05 ENCOUNTER — Other Ambulatory Visit: Payer: Self-pay

## 2020-03-05 ENCOUNTER — Telehealth: Payer: Self-pay

## 2020-03-05 DIAGNOSIS — G4486 Cervicogenic headache: Secondary | ICD-10-CM

## 2020-03-05 DIAGNOSIS — G894 Chronic pain syndrome: Secondary | ICD-10-CM

## 2020-03-05 DIAGNOSIS — M25511 Pain in right shoulder: Secondary | ICD-10-CM | POA: Diagnosis not present

## 2020-03-05 DIAGNOSIS — M79602 Pain in left arm: Secondary | ICD-10-CM

## 2020-03-05 DIAGNOSIS — M542 Cervicalgia: Secondary | ICD-10-CM | POA: Diagnosis not present

## 2020-03-05 DIAGNOSIS — Z79899 Other long term (current) drug therapy: Secondary | ICD-10-CM

## 2020-03-05 DIAGNOSIS — M159 Polyosteoarthritis, unspecified: Secondary | ICD-10-CM

## 2020-03-05 DIAGNOSIS — F1120 Opioid dependence, uncomplicated: Secondary | ICD-10-CM

## 2020-03-05 DIAGNOSIS — G8929 Other chronic pain: Secondary | ICD-10-CM

## 2020-03-05 DIAGNOSIS — M79601 Pain in right arm: Secondary | ICD-10-CM

## 2020-03-05 DIAGNOSIS — M25512 Pain in left shoulder: Secondary | ICD-10-CM

## 2020-03-05 DIAGNOSIS — M792 Neuralgia and neuritis, unspecified: Secondary | ICD-10-CM

## 2020-03-05 DIAGNOSIS — M545 Low back pain, unspecified: Secondary | ICD-10-CM

## 2020-03-05 DIAGNOSIS — M8949 Other hypertrophic osteoarthropathy, multiple sites: Secondary | ICD-10-CM

## 2020-03-05 MED ORDER — MELOXICAM 15 MG PO TABS: 15.0000 mg | ORAL_TABLET | Freq: Every day | ORAL | 0 refills | Status: DC

## 2020-03-05 MED ORDER — TRAMADOL HCL 50 MG PO TABS: 50.0000 mg | ORAL_TABLET | Freq: Two times a day (BID) | ORAL | 0 refills | Status: DC

## 2020-03-05 MED ORDER — GABAPENTIN 300 MG PO CAPS: 300.0000 mg | ORAL_CAPSULE | Freq: Every day | ORAL | 0 refills | Status: DC

## 2020-03-05 NOTE — Telephone Encounter (Signed)
Interpreter Claudia asked me if we could change her appt to virtual because she is sick. I changed it and sent the doctor a message.

## 2020-07-02 ENCOUNTER — Other Ambulatory Visit: Payer: Self-pay | Admitting: Family Medicine

## 2020-07-02 DIAGNOSIS — Z1231 Encounter for screening mammogram for malignant neoplasm of breast: Secondary | ICD-10-CM

## 2020-07-03 ENCOUNTER — Other Ambulatory Visit: Payer: Self-pay | Admitting: Pain Medicine

## 2020-07-03 DIAGNOSIS — G894 Chronic pain syndrome: Secondary | ICD-10-CM

## 2020-07-04 ENCOUNTER — Other Ambulatory Visit: Payer: Self-pay | Admitting: Family Medicine

## 2020-07-04 DIAGNOSIS — R109 Unspecified abdominal pain: Secondary | ICD-10-CM

## 2020-07-04 DIAGNOSIS — R634 Abnormal weight loss: Secondary | ICD-10-CM

## 2020-09-12 ENCOUNTER — Ambulatory Visit: Payer: Medicare HMO

## 2020-09-26 ENCOUNTER — Ambulatory Visit
Admission: RE | Admit: 2020-09-26 | Discharge: 2020-09-26 | Disposition: A | Discharge status: Home / Self Care | Payer: Medicare HMO | Source: Ambulatory Visit | Attending: Family Medicine | Admitting: Family Medicine

## 2020-09-26 ENCOUNTER — Other Ambulatory Visit: Payer: Self-pay

## 2020-09-26 DIAGNOSIS — R109 Unspecified abdominal pain: Secondary | ICD-10-CM | POA: Insufficient documentation

## 2020-09-26 DIAGNOSIS — R634 Abnormal weight loss: Secondary | ICD-10-CM | POA: Diagnosis present

## 2020-10-27 ENCOUNTER — Other Ambulatory Visit: Payer: Self-pay

## 2020-10-27 ENCOUNTER — Encounter: Payer: Self-pay | Admitting: Gastroenterology

## 2020-10-27 ENCOUNTER — Ambulatory Visit: Payer: Medicare HMO | Admitting: Gastroenterology

## 2020-10-27 VITALS — BP 150/83 | HR 71 | Temp 97.8°F | Ht 63.0 in | Wt 146.0 lb

## 2020-10-27 DIAGNOSIS — G8929 Other chronic pain: Secondary | ICD-10-CM

## 2020-10-27 DIAGNOSIS — R1013 Epigastric pain: Secondary | ICD-10-CM | POA: Diagnosis not present

## 2020-10-27 DIAGNOSIS — R194 Change in bowel habit: Secondary | ICD-10-CM

## 2020-10-27 DIAGNOSIS — K2960 Other gastritis without bleeding: Secondary | ICD-10-CM

## 2020-10-27 MED ORDER — OMEPRAZOLE 20 MG PO CPDR: 40.0000 mg | DELAYED_RELEASE_CAPSULE | Freq: Every day | ORAL | 1 refills | Status: DC

## 2020-10-27 MED ORDER — CLENPIQ 10-3.5-12 MG-GM -GM/160ML PO SOLN
ORAL | 0 refills | Status: DC
Start: 2020-10-27 — End: 2021-02-15

## 2020-10-27 NOTE — Addendum Note (Signed)
Addended by: Wayna Chalet on: 10/27/2020 02:36 PM   Modules accepted: Orders, SmartSet

## 2020-10-27 NOTE — Patient Instructions (Signed)
Take 1 bottle at 5 PM followed by five 8 oz cups of water and repeat 5 hours before procedure.

## 2020-10-27 NOTE — Progress Notes (Signed)
Jonathon Bellows MD, MRCP(U.K) [address]  [place]  [place], [place] [address]  Main: [phone]  Fax: [phone]   Gastroenterology Consultation  Referring Provider:     Ellamae Sia, MD Primary Care Physician:  [facility] [place] Primary Gastroenterologist:  Dr. Jonathon Bellows  Reason for Consultation: GERD, Epigastric pain        HPI:   Alyssa Wood is a 60 y.o. y/o female referred for GERD and Epigastric pain . Entire visit was conducted via an iPad interpreter.  Patient speaks only [place].  She has been having abdominal pain since March April of this year.  Gradually getting worse.  Occurs every time she eats within 15 minutes.  Last for a few hours.  Points to the upper part of her abdomen and radiates to the sides.  Sharp in nature.  Associated nausea and vomiting at times.  She has been taking meloxicam almost daily for many months for shoulder pain.  Denies any other NSAID use.  She has also noted a change in her bowel habits.  She has softer than usual stools.  Up to 4 times a day.  No blood in the stool.  Recalls her last colonoscopy was 5 years back and had polyps at that time.  Not on any PPI.  08/16/2019: Hemoglobin 11.9 g, MCV 81 09/26/2020: Ultrasound abdomen complete status postcholecystectomy otherwise negative examination  Past Medical History:  Diagnosis Date   Anxiety    Chronic pain    Depression    Diabetes mellitus without complication (HCC)    GERD (gastroesophageal reflux disease)    Hypertension     Past Surgical History:  Procedure Laterality Date   CHOLECYSTECTOMY  1992   COLONOSCOPY WITH PROPOFOL N/A 08/28/2015   Procedure: COLONOSCOPY WITH PROPOFOL;  Surgeon: [name], MD;  Location: [facility];  Service: Endoscopy;  Laterality: N/A;  PER PT PLEASE LEAVE LAST NEEDS INTERPRETER   POLYPECTOMY  08/28/2015   Procedure: POLYPECTOMY;  Surgeon: [name], MD;  Location: [facility];  Service:  Endoscopy;;   SHOULDER SURGERY Right    SPINAL CORD STIMULATOR IMPLANT Right Jan 2013   WRIST SURGERY Right    right hand and wrist reconstruction    Prior to Admission medications   Medication Sig Start Date End Date Taking? Authorizing Provider  amLODipine (NORVASC) 2.5 MG tablet Take 1 tablet (2.5 mg total) by mouth daily. 08/17/19 09/16/19  Duffy Bruce, MD  Calcium Carbonate-Vitamin D 600-400 MG-UNIT tablet Take 1 tablet by mouth 2 (two) times daily.     [provider]  clobetasol ointment (TEMOVATE) 0.05 % Apply twice daily to rash on hands when present. 04/02/19   [provider]  Cyanocobalamin (VITAMIN B 12 PO) Take 1 tablet by mouth daily.    [provider]  gabapentin (NEURONTIN) 300 MG capsule Take 1 capsule (300 mg total) by mouth at bedtime. Follow written titration schedule 04/01/20 06/30/20  Milinda Pointer, MD  hydrOXYzine (ATARAX/VISTARIL) 25 MG tablet Take 1 tablet (25 mg total) by mouth every 8 (eight) hours as needed for anxiety. 08/17/19   Duffy Bruce, MD  lisinopril-hydrochlorothiazide (PRINZIDE,ZESTORETIC) 20-12.5 MG tablet Take 1 tablet by mouth daily.     [provider]  meloxicam (MOBIC) 15 MG tablet Take 1 tablet (15 mg total) by mouth daily. 04/01/20 06/30/20  Milinda Pointer, MD  metFORMIN (GLUCOPHAGE) 500 MG tablet Take 500 mg by mouth 2 (two) times daily with a meal.  [provider]  omega-3 acid ethyl esters (LOVAZA) 1 g capsule Take by mouth 2 (two) times daily.    [provider]  omeprazole (PRILOSEC) 20 MG capsule Take 40 mg by mouth daily as needed (for acid reflux).     [provider]  rosuvastatin (CRESTOR) 10 MG tablet Take 10 mg by mouth at bedtime. 07/02/20   [provider]  sertraline (ZOLOFT) 25 MG tablet Take 25 mg by mouth daily.     [provider]  traMADol (ULTRAM) 50 MG tablet Take 1 tablet (50 mg total) by mouth 2 (two) times daily. 04/01/20 06/30/20   Milinda Pointer, MD    Family History  Problem Relation Age of Onset   Cancer Father    Heart disease Father      Social History   Tobacco Use   Smoking status: Never   Smokeless tobacco: Never  Vaping Use   Vaping Use: Never used  Substance Use Topics   Alcohol use: No   Drug use: No    Allergies as of 10/27/2020 - Review Complete 10/27/2020  Allergen Reaction Noted   Trazodone and nefazodone Anaphylaxis 08/19/2015   Hydrocodone Nausea And Vomiting 08/19/2015    Review of Systems:    All systems reviewed and negative except where noted in HPI.   Physical Exam:  BP (!) 150/83   Pulse 71   Temp 97.8 F (36.6 C) (Oral)   Ht 5\' 3"  (1.6 m)   Wt 146 lb (66.2 kg)   BMI 25.86 kg/m  No LMP recorded. Patient is postmenopausal. Psych:  Alert and cooperative. Normal mood and affect. General:   Alert,  Well-developed, well-nourished, pleasant and cooperative in NAD Head:  Normocephalic and atraumatic. Eyes:  Sclera clear, no icterus.   Conjunctiva pink. Ears:  Normal auditory acuity. Nose:  No deformity, discharge, or lesions. Lungs:  Respirations even and unlabored.  Clear throughout to auscultation.   No wheezes, crackles, or rhonchi. No acute distress. Heart:  Regular rate and rhythm; no murmurs, clicks, rubs, or gallops. Abdomen:  Normal bowel sounds.  No bruits.  Soft, non-tender and non-distended without masses, hepatosplenomegaly or hernias noted.  No guarding or rebound tenderness.    Neurologic:  Alert and oriented x3;  grossly normal neurologically. Psych:  Alert and cooperative. Normal mood and affect.  Imaging Studies: No results found.  Assessment and Plan:   Alyssa Wood is a 60 y.o. y/o female has been referred for chronic abdominal pain since February this year along with change in bowel habits since May of this year.  Long-term use of meloxicam.  This may very well because dyspeptic symptoms from NSAID related gastritis or ulceration.   Change in bowel habits also may be related to NSAIDs.  In view of the chronicity of the condition we will plan to perform an EGD and colonoscopy.  I have strongly suggest to stop the NSAIDs.  We will commence on Prilosec 40 mg once a day.  We will check for H. pylori with the breath test in the office today.  She is also on metformin and if her bowel habits do not return to normal after cessation of meloxicam we will plan to give a trial of holding metformin.  Metformin is well known to cause diarrhea.  Other differentials include irritable bowel syndrome and bile salt mediated diarrhea status postcholecystectomy  I have discussed alternative options, risks & benefits,  which include, but are not limited to, bleeding, infection, perforation,respiratory complication & drug  reaction.  The patient agrees with this plan & written consent will be obtained.     Follow up in 6 weeks  Dr Jonathon Bellows MD,MRCP(U.K)

## 2020-10-28 LAB — H. PYLORI BREATH TEST: H pylori Breath Test: POSITIVE — AB

## 2020-10-29 NOTE — Progress Notes (Signed)
H pylori gastritis.   H. pylori breath test positive  Suggest clarithromycin 500 mg PO BID, amoxicillin 1 gram BID, omeprazole 20 mg BID all for 14 days.  ,  Inquire and confirm she is not allergic to penicillin and will need repeat H pylori stool antigen to check for eradication after .

## 2020-10-31 ENCOUNTER — Telehealth: Payer: Self-pay

## 2020-10-31 NOTE — Telephone Encounter (Signed)
-----   Message from Jonathon Bellows, MD sent at 10/29/2020  8:47 AM EDT ----- H pylori gastritis.   H. pylori breath test positive  Suggest clarithromycin 500 mg PO BID, amoxicillin 1 gram BID, omeprazole 20 mg BID all for 14 days.  ,  Inquire and confirm she is not allergic to penicillin and will need repeat H pylori stool antigen to check for eradication after .

## 2020-11-03 MED ORDER — OMEPRAZOLE 20 MG PO CPDR
20.0000 mg | DELAYED_RELEASE_CAPSULE | Freq: Two times a day (BID) | ORAL | 0 refills | Status: DC
Start: 2020-11-03 — End: 2021-02-13

## 2020-11-03 MED ORDER — CLARITHROMYCIN 500 MG PO TABS
500.0000 mg | ORAL_TABLET | Freq: Two times a day (BID) | ORAL | 0 refills | Status: AC
Start: 2020-11-03 — End: 2020-11-17

## 2020-11-03 MED ORDER — AMOXICILLIN 500 MG PO CAPS: 500.0000 mg | ORAL_CAPSULE | Freq: Two times a day (BID) | ORAL | 0 refills | Status: AC

## 2020-11-03 NOTE — Telephone Encounter (Signed)
Patient was contacted on Friday to let her know that she was H Pylori positive therefore, she needed to start taking antibiotics. Patient agreed and knows to come back 6 weeks after her treatment to make sure that she is cleared.

## 2020-12-09 ENCOUNTER — Ambulatory Visit
Admission: RE | Admit: 2020-12-09 | Discharge: 2020-12-09 | Disposition: A | Discharge status: Home / Self Care | Payer: Medicare HMO | Source: Ambulatory Visit | Attending: Family Medicine | Admitting: Family Medicine

## 2020-12-09 ENCOUNTER — Other Ambulatory Visit: Payer: Self-pay | Admitting: Family Medicine

## 2020-12-09 ENCOUNTER — Other Ambulatory Visit: Payer: Self-pay

## 2020-12-09 DIAGNOSIS — Z1231 Encounter for screening mammogram for malignant neoplasm of breast: Secondary | ICD-10-CM | POA: Insufficient documentation

## 2020-12-09 DIAGNOSIS — R059 Cough, unspecified: Secondary | ICD-10-CM

## 2020-12-17 ENCOUNTER — Telehealth: Payer: Self-pay | Admitting: Gastroenterology

## 2020-12-19 ENCOUNTER — Ambulatory Visit: Admission: RE | Admit: 2020-12-19 | Payer: Medicare HMO | Source: Home / Self Care | Admitting: Gastroenterology

## 2020-12-19 ENCOUNTER — Encounter: Admission: RE | Payer: Self-pay | Source: Home / Self Care

## 2020-12-19 ENCOUNTER — Other Ambulatory Visit: Payer: Self-pay

## 2020-12-19 DIAGNOSIS — R194 Change in bowel habit: Secondary | ICD-10-CM

## 2020-12-19 DIAGNOSIS — G8929 Other chronic pain: Secondary | ICD-10-CM

## 2020-12-19 SURGERY — COLONOSCOPY WITH PROPOFOL
Anesthesia: General

## 2020-12-19 NOTE — Telephone Encounter (Signed)
Called patient back and she stated that she had pneumonia and a terrible cough, therefore, she wanted to reschedule her procedure until 02/16/2021. Patient's insurance will not change next year.

## 2020-12-22 ENCOUNTER — Ambulatory Visit: Payer: Medicare HMO | Admitting: Gastroenterology

## 2021-01-09 ENCOUNTER — Other Ambulatory Visit: Payer: Self-pay

## 2021-01-09 ENCOUNTER — Encounter: Payer: Self-pay | Admitting: Emergency Medicine

## 2021-01-09 ENCOUNTER — Emergency Department
Admission: EM | Admit: 2021-01-09 | Discharge: 2021-01-09 | Disposition: A | Discharge status: Home / Self Care | Payer: Medicare HMO | Attending: Emergency Medicine | Admitting: Emergency Medicine

## 2021-01-09 DIAGNOSIS — Z5321 Procedure and treatment not carried out due to patient leaving prior to being seen by health care provider: Secondary | ICD-10-CM | POA: Diagnosis not present

## 2021-01-09 DIAGNOSIS — R309 Painful micturition, unspecified: Secondary | ICD-10-CM | POA: Diagnosis not present

## 2021-01-09 DIAGNOSIS — R1010 Upper abdominal pain, unspecified: Secondary | ICD-10-CM | POA: Insufficient documentation

## 2021-01-09 DIAGNOSIS — R519 Headache, unspecified: Secondary | ICD-10-CM | POA: Insufficient documentation

## 2021-01-09 DIAGNOSIS — M549 Dorsalgia, unspecified: Secondary | ICD-10-CM | POA: Insufficient documentation

## 2021-01-09 LAB — CBC
HCT: 34.4 % — ABNORMAL LOW (ref 36.0–46.0)
Hemoglobin: 11.2 g/dL — ABNORMAL LOW (ref 12.0–15.0)
MCH: 28.5 pg (ref 26.0–34.0)
MCHC: 32.6 g/dL (ref 30.0–36.0)
MCV: 87.5 fL (ref 80.0–100.0)
Platelets: 259 10*3/uL (ref 150–400)
RBC: 3.93 MIL/uL (ref 3.87–5.11)
RDW: 13.7 % (ref 11.5–15.5)
WBC: 17.7 10*3/uL — ABNORMAL HIGH (ref 4.0–10.5)
nRBC: 0 % (ref 0.0–0.2)

## 2021-01-09 LAB — COMPREHENSIVE METABOLIC PANEL
ALT: 335 U/L — ABNORMAL HIGH (ref 0–44)
AST: 98 U/L — ABNORMAL HIGH (ref 15–41)
Albumin: 3.8 g/dL (ref 3.5–5.0)
Alkaline Phosphatase: 98 U/L (ref 38–126)
Anion gap: 5 (ref 5–15)
BUN: 30 mg/dL — ABNORMAL HIGH (ref 8–23)
CO2: 25 mmol/L (ref 22–32)
Calcium: 9.1 mg/dL (ref 8.9–10.3)
Chloride: 103 mmol/L (ref 98–111)
Creatinine, Ser: 0.84 mg/dL (ref 0.44–1.00)
GFR, Estimated: >60 mL/min (ref >60)
Glucose, Bld: 145 mg/dL — ABNORMAL HIGH (ref 70–99)
Potassium: 4.2 mmol/L (ref 3.5–5.1)
Sodium: 133 mmol/L — ABNORMAL LOW (ref 135–145)
Total Bilirubin: 0.5 mg/dL (ref 0.3–1.2)
Total Protein: 7.1 g/dL (ref 6.5–8.1)

## 2021-01-09 LAB — LIPASE, BLOOD: Lipase: 25 U/L (ref 11–51)

## 2021-01-09 NOTE — ED Triage Notes (Signed)
Pt to ED from home via EMS c/o upper abd pain radiating to back around 2000 last night, painful urination, also c/o headache.  Denies n/v/d, hx of gallbladder removal.  Tearful in triage, chest rise even and unlabored, video interpreter used, in NAD at this time.

## 2021-01-14 ENCOUNTER — Other Ambulatory Visit: Payer: Self-pay | Admitting: Family Medicine

## 2021-01-14 DIAGNOSIS — R059 Cough, unspecified: Secondary | ICD-10-CM

## 2021-02-10 ENCOUNTER — Ambulatory Visit
Admission: RE | Admit: 2021-02-10 | Discharge: 2021-02-10 | Disposition: A | Discharge status: Home / Self Care | Payer: Medicare HMO | Source: Ambulatory Visit | Attending: Family Medicine | Admitting: Family Medicine

## 2021-02-10 ENCOUNTER — Other Ambulatory Visit: Payer: Self-pay

## 2021-02-10 DIAGNOSIS — R531 Weakness: Secondary | ICD-10-CM | POA: Diagnosis not present

## 2021-02-10 DIAGNOSIS — K529 Noninfective gastroenteritis and colitis, unspecified: Secondary | ICD-10-CM | POA: Diagnosis not present

## 2021-02-10 DIAGNOSIS — R059 Cough, unspecified: Secondary | ICD-10-CM

## 2021-02-12 ENCOUNTER — Encounter: Payer: Self-pay | Admitting: *Deleted

## 2021-02-12 ENCOUNTER — Other Ambulatory Visit: Payer: Self-pay

## 2021-02-12 ENCOUNTER — Emergency Department: Payer: Medicare HMO

## 2021-02-12 DIAGNOSIS — K219 Gastro-esophageal reflux disease without esophagitis: Secondary | ICD-10-CM | POA: Diagnosis present

## 2021-02-12 DIAGNOSIS — Z885 Allergy status to narcotic agent status: Secondary | ICD-10-CM

## 2021-02-12 DIAGNOSIS — Z87440 Personal history of urinary (tract) infections: Secondary | ICD-10-CM

## 2021-02-12 DIAGNOSIS — N3000 Acute cystitis without hematuria: Secondary | ICD-10-CM | POA: Diagnosis present

## 2021-02-12 DIAGNOSIS — I10 Essential (primary) hypertension: Secondary | ICD-10-CM | POA: Diagnosis present

## 2021-02-12 DIAGNOSIS — Z8719 Personal history of other diseases of the digestive system: Secondary | ICD-10-CM

## 2021-02-12 DIAGNOSIS — Z7984 Long term (current) use of oral hypoglycemic drugs: Secondary | ICD-10-CM

## 2021-02-12 DIAGNOSIS — E871 Hypo-osmolality and hyponatremia: Secondary | ICD-10-CM | POA: Diagnosis present

## 2021-02-12 DIAGNOSIS — K529 Noninfective gastroenteritis and colitis, unspecified: Principal | ICD-10-CM | POA: Diagnosis present

## 2021-02-12 DIAGNOSIS — B9620 Unspecified Escherichia coli [E. coli] as the cause of diseases classified elsewhere: Secondary | ICD-10-CM | POA: Diagnosis present

## 2021-02-12 DIAGNOSIS — Z888 Allergy status to other drugs, medicaments and biological substances status: Secondary | ICD-10-CM

## 2021-02-12 DIAGNOSIS — E119 Type 2 diabetes mellitus without complications: Secondary | ICD-10-CM | POA: Diagnosis present

## 2021-02-12 DIAGNOSIS — E278 Other specified disorders of adrenal gland: Secondary | ICD-10-CM | POA: Diagnosis present

## 2021-02-12 DIAGNOSIS — Z791 Long term (current) use of non-steroidal anti-inflammatories (NSAID): Secondary | ICD-10-CM

## 2021-02-12 DIAGNOSIS — Z8249 Family history of ischemic heart disease and other diseases of the circulatory system: Secondary | ICD-10-CM

## 2021-02-12 DIAGNOSIS — Z9682 Presence of neurostimulator: Secondary | ICD-10-CM

## 2021-02-12 DIAGNOSIS — K2900 Acute gastritis without bleeding: Secondary | ICD-10-CM | POA: Diagnosis present

## 2021-02-12 DIAGNOSIS — Z20822 Contact with and (suspected) exposure to covid-19: Secondary | ICD-10-CM | POA: Diagnosis present

## 2021-02-12 DIAGNOSIS — Z79899 Other long term (current) drug therapy: Secondary | ICD-10-CM

## 2021-02-12 DIAGNOSIS — R630 Anorexia: Secondary | ICD-10-CM | POA: Diagnosis present

## 2021-02-12 DIAGNOSIS — N179 Acute kidney failure, unspecified: Secondary | ICD-10-CM | POA: Diagnosis present

## 2021-02-12 DIAGNOSIS — F32A Depression, unspecified: Secondary | ICD-10-CM | POA: Diagnosis present

## 2021-02-12 DIAGNOSIS — K635 Polyp of colon: Secondary | ICD-10-CM | POA: Diagnosis present

## 2021-02-12 DIAGNOSIS — D62 Acute posthemorrhagic anemia: Secondary | ICD-10-CM | POA: Diagnosis present

## 2021-02-12 DIAGNOSIS — Z86010 Personal history of colonic polyps: Secondary | ICD-10-CM

## 2021-02-12 DIAGNOSIS — Z8619 Personal history of other infectious and parasitic diseases: Secondary | ICD-10-CM

## 2021-02-12 DIAGNOSIS — E860 Dehydration: Secondary | ICD-10-CM | POA: Diagnosis present

## 2021-02-12 DIAGNOSIS — F419 Anxiety disorder, unspecified: Secondary | ICD-10-CM | POA: Diagnosis present

## 2021-02-12 DIAGNOSIS — Z9049 Acquired absence of other specified parts of digestive tract: Secondary | ICD-10-CM

## 2021-02-12 DIAGNOSIS — G894 Chronic pain syndrome: Secondary | ICD-10-CM | POA: Diagnosis present

## 2021-02-12 DIAGNOSIS — Z809 Family history of malignant neoplasm, unspecified: Secondary | ICD-10-CM

## 2021-02-12 LAB — URINALYSIS, COMPLETE (UACMP) WITH MICROSCOPIC
Bilirubin Urine: NEGATIVE
Glucose, UA: NEGATIVE mg/dL
Hgb urine dipstick: NEGATIVE
Ketones, ur: NEGATIVE mg/dL
Nitrite: NEGATIVE
Protein, ur: NEGATIVE mg/dL
Specific Gravity, Urine: 1.008 (ref 1.005–1.030)
WBC, UA: >50 WBC/hpf — ABNORMAL HIGH (ref 0–5)
pH: 6 (ref 5.0–8.0)

## 2021-02-12 LAB — HEPATIC FUNCTION PANEL
ALT: 16 U/L (ref 0–44)
AST: 24 U/L (ref 15–41)
Albumin: 3.8 g/dL (ref 3.5–5.0)
Alkaline Phosphatase: 88 U/L (ref 38–126)
Bilirubin, Direct: <0.1 mg/dL (ref 0.0–0.2)
Total Bilirubin: 0.5 mg/dL (ref 0.3–1.2)
Total Protein: 9.1 g/dL — ABNORMAL HIGH (ref 6.5–8.1)

## 2021-02-12 LAB — BASIC METABOLIC PANEL
Anion gap: 7 (ref 5–15)
BUN: 19 mg/dL (ref 8–23)
CO2: 25 mmol/L (ref 22–32)
Calcium: 9.5 mg/dL (ref 8.9–10.3)
Chloride: 96 mmol/L — ABNORMAL LOW (ref 98–111)
Creatinine, Ser: 1.47 mg/dL — ABNORMAL HIGH (ref 0.44–1.00)
GFR, Estimated: 40 mL/min — ABNORMAL LOW (ref >60)
Glucose, Bld: 88 mg/dL (ref 70–99)
Potassium: 4.1 mmol/L (ref 3.5–5.1)
Sodium: 128 mmol/L — ABNORMAL LOW (ref 135–145)

## 2021-02-12 LAB — TROPONIN I (HIGH SENSITIVITY)
Troponin I (High Sensitivity): 3 ng/L (ref <18)
Troponin I (High Sensitivity): 4 ng/L (ref <18)

## 2021-02-12 LAB — LIPASE, BLOOD: Lipase: 32 U/L (ref 11–51)

## 2021-02-12 LAB — CBC
HCT: 29 % — ABNORMAL LOW (ref 36.0–46.0)
Hemoglobin: 9.4 g/dL — ABNORMAL LOW (ref 12.0–15.0)
MCH: 27.2 pg (ref 26.0–34.0)
MCHC: 32.4 g/dL (ref 30.0–36.0)
MCV: 84.1 fL (ref 80.0–100.0)
Platelets: 373 10*3/uL (ref 150–400)
RBC: 3.45 MIL/uL — ABNORMAL LOW (ref 3.87–5.11)
RDW: 13.1 % (ref 11.5–15.5)
WBC: 9.2 10*3/uL (ref 4.0–10.5)
nRBC: 0 % (ref 0.0–0.2)

## 2021-02-12 LAB — RESP PANEL BY RT-PCR (FLU A&B, COVID) ARPGX2
Influenza A by PCR: NEGATIVE
Influenza B by PCR: NEGATIVE
SARS Coronavirus 2 by RT PCR: NEGATIVE

## 2021-02-12 MED ORDER — ACETAMINOPHEN 325 MG PO TABS
650.0000 mg | ORAL_TABLET | Freq: Once | ORAL | Status: AC
  Administered 2021-02-12: 650 mg via ORAL
  Filled 2021-02-12: qty 2

## 2021-02-12 NOTE — ED Triage Notes (Signed)
Pt sent to er for eval of weakness.  Pt reports chest pain, vomiting, weakness.  Sx for 2 weeks.  Pt alert  speech clear.

## 2021-02-12 NOTE — ED Provider Triage Note (Signed)
Emergency Medicine Provider Triage Evaluation Note  Alyssa Wood , a 61 y.o. female  was evaluated in triage.  Pt complains of weakness, vomiting, recent UTI treated with Levaquin 500 mg daily for 5 days.  Some flank discomfort.  No fever.  Review of Systems  Positive: Flank pain, weakness, vomiting, recent UTI Negative: Chest pain, shortness of breath, fever  Physical Exam  BP 129/63 (BP Location: Left Arm)    Pulse 71    Temp 98.9 F (37.2 C) (Oral)    Resp 20    SpO2 99%  Gen:   Awake, no distress   Resp:  Normal effort  MSK:   Moves extremities without difficulty  Other:  Abdomen is tender in the epigastric to right upper quadrant, positive CVA tenderness  Medical Decision Making  Medically screening exam initiated at 4:37 PM.  Appropriate orders placed.  Henry Dominguez-Vasquez was informed that the remainder of the evaluation will be completed by another provider, this initial triage assessment does not replace that evaluation, and the importance of remaining in the ED until their evaluation is complete.     Versie Starks, PA-C 02/12/21 [phone]

## 2021-02-13 ENCOUNTER — Inpatient Hospital Stay: Payer: Medicare HMO | Admitting: Anesthesiology

## 2021-02-13 ENCOUNTER — Encounter: Payer: Self-pay | Admitting: Internal Medicine

## 2021-02-13 ENCOUNTER — Encounter
Admission: EM | Disposition: A | Discharge status: Home / Self Care | Payer: Self-pay | Source: Home / Self Care | Attending: Obstetrics and Gynecology

## 2021-02-13 ENCOUNTER — Inpatient Hospital Stay
Admission: EM | Admit: 2021-02-13 | Discharge: 2021-02-15 | DRG: 392 | Disposition: A | Discharge status: Home / Self Care | Payer: Medicare HMO | Attending: Obstetrics and Gynecology | Admitting: Obstetrics and Gynecology

## 2021-02-13 ENCOUNTER — Emergency Department: Payer: Medicare HMO

## 2021-02-13 DIAGNOSIS — E119 Type 2 diabetes mellitus without complications: Secondary | ICD-10-CM | POA: Diagnosis present

## 2021-02-13 DIAGNOSIS — D126 Benign neoplasm of colon, unspecified: Secondary | ICD-10-CM | POA: Diagnosis not present

## 2021-02-13 DIAGNOSIS — E860 Dehydration: Secondary | ICD-10-CM | POA: Diagnosis present

## 2021-02-13 DIAGNOSIS — M792 Neuralgia and neuritis, unspecified: Secondary | ICD-10-CM

## 2021-02-13 DIAGNOSIS — R531 Weakness: Secondary | ICD-10-CM

## 2021-02-13 DIAGNOSIS — D62 Acute posthemorrhagic anemia: Secondary | ICD-10-CM | POA: Diagnosis present

## 2021-02-13 DIAGNOSIS — K921 Melena: Secondary | ICD-10-CM | POA: Diagnosis not present

## 2021-02-13 DIAGNOSIS — F419 Anxiety disorder, unspecified: Secondary | ICD-10-CM | POA: Diagnosis present

## 2021-02-13 DIAGNOSIS — N390 Urinary tract infection, site not specified: Secondary | ICD-10-CM

## 2021-02-13 DIAGNOSIS — Z809 Family history of malignant neoplasm, unspecified: Secondary | ICD-10-CM | POA: Diagnosis not present

## 2021-02-13 DIAGNOSIS — E871 Hypo-osmolality and hyponatremia: Secondary | ICD-10-CM

## 2021-02-13 DIAGNOSIS — K635 Polyp of colon: Secondary | ICD-10-CM | POA: Diagnosis present

## 2021-02-13 DIAGNOSIS — F32A Depression, unspecified: Secondary | ICD-10-CM | POA: Diagnosis present

## 2021-02-13 DIAGNOSIS — Z20822 Contact with and (suspected) exposure to covid-19: Secondary | ICD-10-CM | POA: Diagnosis present

## 2021-02-13 DIAGNOSIS — I10 Essential (primary) hypertension: Secondary | ICD-10-CM | POA: Diagnosis present

## 2021-02-13 DIAGNOSIS — D649 Anemia, unspecified: Secondary | ICD-10-CM

## 2021-02-13 DIAGNOSIS — K529 Noninfective gastroenteritis and colitis, unspecified: Principal | ICD-10-CM

## 2021-02-13 DIAGNOSIS — Z8719 Personal history of other diseases of the digestive system: Secondary | ICD-10-CM | POA: Diagnosis not present

## 2021-02-13 DIAGNOSIS — Z79899 Other long term (current) drug therapy: Secondary | ICD-10-CM | POA: Diagnosis not present

## 2021-02-13 DIAGNOSIS — N179 Acute kidney failure, unspecified: Secondary | ICD-10-CM | POA: Diagnosis present

## 2021-02-13 DIAGNOSIS — Z86010 Personal history of colonic polyps: Secondary | ICD-10-CM | POA: Diagnosis not present

## 2021-02-13 DIAGNOSIS — Z87440 Personal history of urinary (tract) infections: Secondary | ICD-10-CM | POA: Diagnosis not present

## 2021-02-13 DIAGNOSIS — Z8619 Personal history of other infectious and parasitic diseases: Secondary | ICD-10-CM | POA: Diagnosis not present

## 2021-02-13 DIAGNOSIS — E278 Other specified disorders of adrenal gland: Secondary | ICD-10-CM | POA: Diagnosis present

## 2021-02-13 DIAGNOSIS — B9620 Unspecified Escherichia coli [E. coli] as the cause of diseases classified elsewhere: Secondary | ICD-10-CM | POA: Diagnosis present

## 2021-02-13 DIAGNOSIS — Z791 Long term (current) use of non-steroidal anti-inflammatories (NSAID): Secondary | ICD-10-CM | POA: Diagnosis not present

## 2021-02-13 DIAGNOSIS — R112 Nausea with vomiting, unspecified: Secondary | ICD-10-CM

## 2021-02-13 DIAGNOSIS — N3000 Acute cystitis without hematuria: Secondary | ICD-10-CM | POA: Diagnosis present

## 2021-02-13 DIAGNOSIS — K2900 Acute gastritis without bleeding: Secondary | ICD-10-CM | POA: Diagnosis present

## 2021-02-13 DIAGNOSIS — F418 Other specified anxiety disorders: Secondary | ICD-10-CM | POA: Diagnosis present

## 2021-02-13 DIAGNOSIS — G894 Chronic pain syndrome: Secondary | ICD-10-CM | POA: Diagnosis present

## 2021-02-13 DIAGNOSIS — Z8249 Family history of ischemic heart disease and other diseases of the circulatory system: Secondary | ICD-10-CM | POA: Diagnosis not present

## 2021-02-13 DIAGNOSIS — G8929 Other chronic pain: Secondary | ICD-10-CM

## 2021-02-13 DIAGNOSIS — R519 Headache, unspecified: Secondary | ICD-10-CM

## 2021-02-13 DIAGNOSIS — R197 Diarrhea, unspecified: Secondary | ICD-10-CM

## 2021-02-13 HISTORY — PX: ESOPHAGOGASTRODUODENOSCOPY (EGD) WITH PROPOFOL: SHX5813

## 2021-02-13 LAB — PROTIME-INR
INR: 1 (ref 0.8–1.2)
Prothrombin Time: 13.2 seconds (ref 11.4–15.2)

## 2021-02-13 LAB — HEMOGLOBIN A1C
Hgb A1c MFr Bld: 6.3 % — ABNORMAL HIGH (ref 4.8–5.6)
Mean Plasma Glucose: 134.11 mg/dL

## 2021-02-13 LAB — IRON AND TIBC
Iron: 41 ug/dL (ref 28–170)
Saturation Ratios: 13 % (ref 10.4–31.8)
TIBC: 318 ug/dL (ref 250–450)
UIBC: 277 ug/dL

## 2021-02-13 LAB — TYPE AND SCREEN
ABO/RH(D): O POS
Antibody Screen: NEGATIVE

## 2021-02-13 LAB — CBG MONITORING, ED
Glucose-Capillary: 99 mg/dL (ref 70–99)
Glucose-Capillary: 100 mg/dL — ABNORMAL HIGH (ref 70–99)
Glucose-Capillary: 86 mg/dL (ref 70–99)

## 2021-02-13 LAB — RETICULOCYTES
Immature Retic Fract: 10.6 % (ref 2.3–15.9)
RBC.: 3.6 MIL/uL — ABNORMAL LOW (ref 3.87–5.11)
Retic Count, Absolute: 81 10*3/uL (ref 19.0–186.0)
Retic Ct Pct: 2.3 % (ref 0.4–3.1)

## 2021-02-13 LAB — FERRITIN: Ferritin: 214 ng/mL (ref 11–307)

## 2021-02-13 LAB — FOLATE: Folate: >100 ng/mL (ref >5.9)

## 2021-02-13 LAB — VITAMIN B12: Vitamin B-12: 524 pg/mL (ref 180–914)

## 2021-02-13 LAB — HIV ANTIBODY (ROUTINE TESTING W REFLEX): HIV Screen 4th Generation wRfx: NONREACTIVE

## 2021-02-13 SURGERY — ESOPHAGOGASTRODUODENOSCOPY (EGD) WITH PROPOFOL
Anesthesia: General

## 2021-02-13 MED ORDER — ONDANSETRON HCL 4 MG PO TABS: 4.0000 mg | ORAL_TABLET | Freq: Four times a day (QID) | ORAL | Status: DC | PRN

## 2021-02-13 MED ORDER — SODIUM CHLORIDE 0.9 % IV SOLN: INTRAVENOUS | Status: DC

## 2021-02-13 MED ORDER — PANTOPRAZOLE SODIUM 40 MG IV SOLR
40.0000 mg | Freq: Two times a day (BID) | INTRAVENOUS | Status: DC
  Administered 2021-02-13: 40 mg via INTRAVENOUS
  Filled 2021-02-13: qty 40

## 2021-02-13 MED ORDER — PANTOPRAZOLE INFUSION (NEW) - SIMPLE MED
8.0000 mg/h | INTRAVENOUS | Status: DC
  Administered 2021-02-13: 8 mg/h via INTRAVENOUS
  Filled 2021-02-13: qty 80

## 2021-02-13 MED ORDER — PROPOFOL 500 MG/50ML IV EMUL
INTRAVENOUS | Status: DC | PRN
  Administered 2021-02-13: 120 ug/kg/min via INTRAVENOUS

## 2021-02-13 MED ORDER — ACETAMINOPHEN 325 MG PO TABS
650.0000 mg | ORAL_TABLET | Freq: Four times a day (QID) | ORAL | Status: DC | PRN
  Administered 2021-02-14 (×2): 650 mg via ORAL
  Filled 2021-02-13 (×2): qty 2

## 2021-02-13 MED ORDER — LIDOCAINE 2% (20 MG/ML) 5 ML SYRINGE
INTRAMUSCULAR | Status: DC | PRN
  Administered 2021-02-13: 20 mg via INTRAVENOUS

## 2021-02-13 MED ORDER — PANTOPRAZOLE SODIUM 40 MG IV SOLR: 40.0000 mg | Freq: Two times a day (BID) | INTRAVENOUS | Status: DC

## 2021-02-13 MED ORDER — MORPHINE SULFATE (PF) 2 MG/ML IV SOLN: 2.0000 mg | INTRAVENOUS | Status: DC | PRN

## 2021-02-13 MED ORDER — PROPOFOL 10 MG/ML IV BOLUS
INTRAVENOUS | Status: DC | PRN
  Administered 2021-02-13: 30 mg via INTRAVENOUS
  Administered 2021-02-13: 70 mg via INTRAVENOUS

## 2021-02-13 MED ORDER — INSULIN ASPART 100 UNIT/ML IJ SOLN: 0.0000 [IU] | INTRAMUSCULAR | Status: DC

## 2021-02-13 MED ORDER — ONDANSETRON HCL 4 MG/2ML IJ SOLN
4.0000 mg | Freq: Four times a day (QID) | INTRAMUSCULAR | Status: DC | PRN
  Administered 2021-02-14: 4 mg via INTRAVENOUS
  Filled 2021-02-13: qty 2

## 2021-02-13 MED ORDER — SODIUM CHLORIDE 0.9 % IV SOLN
INTRAVENOUS | Status: DC
  Administered 2021-02-13: 1000 mL via INTRAVENOUS

## 2021-02-13 MED ORDER — SODIUM CHLORIDE 0.9 % IV BOLUS (SEPSIS)
1000.0000 mL | Freq: Once | INTRAVENOUS | Status: AC
  Administered 2021-02-13: 1000 mL via INTRAVENOUS

## 2021-02-13 MED ORDER — SODIUM CHLORIDE 0.9 % IV SOLN
1.0000 g | INTRAVENOUS | Status: DC
  Administered 2021-02-14 – 2021-02-15 (×2): 1 g via INTRAVENOUS
  Filled 2021-02-13 (×3): qty 10

## 2021-02-13 MED ORDER — ONDANSETRON HCL 4 MG/2ML IJ SOLN
4.0000 mg | Freq: Once | INTRAMUSCULAR | Status: AC
  Administered 2021-02-13: 4 mg via INTRAVENOUS
  Filled 2021-02-13: qty 2

## 2021-02-13 MED ORDER — PANTOPRAZOLE SODIUM 40 MG PO TBEC
40.0000 mg | DELAYED_RELEASE_TABLET | Freq: Every day | ORAL | Status: DC
  Administered 2021-02-14: 40 mg via ORAL
  Filled 2021-02-13: qty 1

## 2021-02-13 MED ORDER — PANTOPRAZOLE 80MG IVPB - SIMPLE MED
80.0000 mg | Freq: Once | INTRAVENOUS | Status: AC
  Administered 2021-02-13: 80 mg via INTRAVENOUS
  Filled 2021-02-13: qty 80

## 2021-02-13 MED ORDER — CEFTRIAXONE SODIUM 1 G IJ SOLR
1.0000 g | Freq: Once | INTRAMUSCULAR | Status: AC
  Administered 2021-02-13: 1 g via INTRAVENOUS
  Filled 2021-02-13: qty 10

## 2021-02-13 MED ORDER — ACETAMINOPHEN 650 MG RE SUPP: 650.0000 mg | Freq: Four times a day (QID) | RECTAL | Status: DC | PRN

## 2021-02-13 MED ORDER — FENTANYL CITRATE PF 50 MCG/ML IJ SOSY
50.0000 ug | PREFILLED_SYRINGE | Freq: Once | INTRAMUSCULAR | Status: AC
  Administered 2021-02-13: 50 ug via INTRAVENOUS
  Filled 2021-02-13: qty 1

## 2021-02-13 NOTE — Op Note (Signed)
[facility] Gastroenterology Patient Name: Alyssa Wood Procedure Date: 02/13/2021 1:29 PM MRN: [id number] Account #: [id number] Date of Birth: January 27, 1961 Admit Type: Outpatient Age: 61 Room: [facility] ENDO ROOM 1 Gender: Female Note Status: Finalized Instrument Name: Upper Endoscope [id number] Procedure:             Upper GI endoscopy Indications:           Melena Providers:             [name] DO, DO Medicines:             Monitored Anesthesia Care Complications:         No immediate complications. Estimated blood loss:                         Minimal. Procedure:             Pre-Anesthesia Assessment:                        - Prior to the procedure, a History and Physical was                         performed, and patient medications and allergies were                         reviewed. The patient is competent. The risks and                         benefits of the procedure and the sedation options and                         risks were discussed with the patient all via medical                         interpreter. All questions were answered and informed                         consent was obtained. Patient identification and                         proposed procedure were verified by the physician, the                         nurse, the anesthetist and the technician in the                         endoscopy suite. Mental Status Examination: alert and                         oriented. Airway Examination: normal oropharyngeal                         airway and neck mobility. Respiratory Examination:                         clear to auscultation. CV Examination: RRR, no                         murmurs, no S3 or S4. Prophylactic Antibiotics: The  patient does not require prophylactic antibiotics.                         Prior Anticoagulants: The patient has taken no                         previous anticoagulant or  antiplatelet agents. ASA                         Grade Assessment: III - A patient with severe systemic                         disease. After reviewing the risks and benefits, the                         patient was deemed in satisfactory condition to                         undergo the procedure. The anesthesia plan was to use                         monitored anesthesia care (MAC). Immediately prior to                         administration of medications, the patient was                         re-assessed for adequacy to receive sedatives. The                         heart rate, respiratory rate, oxygen saturations,                         blood pressure, adequacy of pulmonary ventilation, and                         response to care were monitored throughout the                         procedure. The physical status of the patient was                         re-assessed after the procedure.                        After obtaining informed consent, the endoscope was                         passed under direct vision. Throughout the procedure,                         the patient's blood pressure, pulse, and oxygen                         saturations were monitored continuously. The Endoscope                         was introduced through the mouth, and advanced to the  second part of duodenum. The upper GI endoscopy was                         accomplished without difficulty. The patient tolerated                         the procedure well. Findings:      The duodenal bulb, first portion of the duodenum and second portion of       the duodenum were normal. Estimated blood loss was minimal.      The entire examined stomach was normal. Biopsies were taken with a cold       forceps for Helicobacter pylori testing. Estimated blood loss was       minimal.      The Z-line was regular. Estimated blood loss: none.      Esophagogastric landmarks were identified: the  gastroesophageal junction       was found at 36 cm from the incisors.      The exam of the esophagus was otherwise normal. Impression:            - Normal duodenal bulb, first portion of the duodenum                         and second portion of the duodenum.                        - Normal stomach. Biopsied.                        - Z-line regular.                        - Esophagogastric landmarks identified. Recommendation:        - Clear liquid diet. Will discuss with weekend                         covering gastroenterologist about possible                         colonoscopy. Patient is already scheduled for 02/16/2021.                        - Continue present medications.                        - Can transition to once daily proton pump inhibitor                         therapy                        - Avoid non-steroidal anti inflammatories                        - Return patient to hospital ward for ongoing care.                        - Await pathology results. Procedure Code(s):     --- Professional ---  41423, Esophagogastroduodenoscopy, flexible,                         transoral; with biopsy, single or multiple Diagnosis Code(s):     --- Professional ---                        K92.1, Melena (includes Hematochezia) CPT copyright 2019 American Medical Association. All rights reserved. The codes documented in this report are preliminary and upon coder review may  be revised to meet current compliance requirements. Attending Participation:      I personally performed the entire procedure. Volney American, DO [name] DO, DO 02/13/2021 1:59:41 PM This report has been signed electronically. Number of Addenda: 0 Note Initiated On: 02/13/2021 1:29 PM Estimated Blood Loss:  Estimated blood loss was minimal.      [facility]

## 2021-02-13 NOTE — Progress Notes (Addendum)
PROGRESS NOTE    Alyssa Wood  GDJ:[id number] DOB: 1960/10/20 DOA: 02/13/2021 PCP: [facility] [place]  Outpatient Specialists: gastroenterology    Brief Narrative:   Hx dm, htn, chronic pain, chronic nsaids, recent seen by gi and treated for h pylor, also recent treatment for pyelonephritis, coming in with several days weakness, anorexia, nausea/vomiting/diarrhea, epigastric pain, and dysuria. Found to have aki, hyponatremia, new anemia.   Assessment & Plan:   Principal Problem:   Acute gastroenteritis Active Problems:   Anxiety with depression   Hyponatremia   AKI (acute kidney injury) ([place])   Anemia   Generalized weakness  # Nausea/vomiting/diarrhea # Epigastric pain Concern for gastric pathology given epigastric pain, recent h pylori, chronic nsaid use. No hematochezia. Abdominal exam appears benign. Ct stone protocol without acute findings - maintain npo - GI to see - IV PPI BID - IVF - f/u gi pathogen panel and c diff  # Acute kidney injury Likely prerenal from decreased po. Baseline cr normal - IVF, monitor  # Anemia Likely subacute blood loss. Mild currently - trend  # Hyponatremia Likely 2/2 dehydration - ivf  # T2DM Here normoglycemic - metformin on hold - daily fasting glucose  # HTN Here normotensive in setting of above illness - hold home antihypertensives  # Chronic pain - cont home tramadol, gabapentin  # Dysuria Recent rx for pyelo at urgent care. Culture data not available - cont ceftriaxone - f/u culture  # MDD - home zoloft   DVT prophylaxis: SCDs Code Status: full Family Communication: son updated telephonically 1/6  Level of care: Med-Surg Status is: Inpatient  Remains inpatient appropriate because: severity of illness        Consultants:  GI  Procedures: none  Antimicrobials:  ceftriaxone    Subjective: Ongoing mild epigastric pain. No vomiting or diarrhea since  arrival  Objective: Vitals:   02/12/21 1634 02/12/21 2045 02/12/21 2234 02/13/21 0112  BP: 129/63 (!) 115/54 118/61 122/61  Pulse: 71 72 71 71  Resp: 20 18 20 20   Temp: 98.9 F (37.2 C)   98.4 F (36.9 C)  TempSrc: Oral   Oral  SpO2: 99% 95% 97% 97%   No intake or output data in the 24 hours ending 02/13/21 0838 There were no vitals filed for this visit.  Examination:  General exam: Appears calm and comfortable  Respiratory system: Clear to auscultation. Respiratory effort normal. Cardiovascular system: S1 & S2 heard, RRR. No JVD, murmurs, rubs, gallops or clicks. No pedal edema. Gastrointestinal system: Abdomen is nondistended, soft and mildly ttp in epigastrum. No organomegaly or masses felt. Normal bowel sounds heard. Central nervous system: Alert and oriented. No focal neurological deficits. Extremities: Symmetric 5 x 5 power. Skin: No rashes, lesions or ulcers Psychiatry: Judgement and insight appear normal. Mood & affect appropriate.     Data Reviewed: I have personally reviewed following labs and imaging studies  CBC: Recent Labs  Lab 02/12/21 1639  WBC 9.2  HGB 9.4*  HCT 29.0*  MCV 84.1  PLT 622   Basic Metabolic Panel: Recent Labs  Lab 02/12/21 1639  NA 128*  K 4.1  CL 96*  CO2 25  GLUCOSE 88  BUN 19  CREATININE 1.47*  CALCIUM 9.5   GFR: CrCl cannot be calculated (Unknown ideal weight.). Liver Function Tests: Recent Labs  Lab 02/12/21 1639  AST 24  ALT 16  ALKPHOS 88  BILITOT 0.5  PROT 9.1*  ALBUMIN 3.8   Recent Labs  Lab 02/12/21  1639  LIPASE 32   No results for input(s): AMMONIA in the last 168 hours. Coagulation Profile: Recent Labs  Lab 02/13/21 0210  INR 1.0   Cardiac Enzymes: No results for input(s): CKTOTAL, CKMB, CKMBINDEX, TROPONINI in the last 168 hours. BNP (last 3 results) No results for input(s): PROBNP in the last 8760 hours. HbA1C: No results for input(s): HGBA1C in the last 72 hours. CBG: Recent Labs  Lab  02/13/21 0359 02/13/21 0836  GLUCAP 99 100*   Lipid Profile: No results for input(s): CHOL, HDL, LDLCALC, TRIG, CHOLHDL, LDLDIRECT in the last 72 hours. Thyroid Function Tests: No results for input(s): TSH, T4TOTAL, FREET4, T3FREE, THYROIDAB in the last 72 hours. Anemia Panel: Recent Labs    02/13/21 0306  FOLATE >100.0  FERRITIN 214  TIBC 318  IRON 41  RETICCTPCT 2.3   Urine analysis:    Component Value Date/Time   COLORURINE YELLOW (A) 02/12/2021 2229   APPEARANCEUR HAZY (A) 02/12/2021 2229   APPEARANCEUR Clear 11/28/2017 0843   LABSPEC 1.008 02/12/2021 2229   PHURINE 6.0 02/12/2021 2229   GLUCOSEU NEGATIVE 02/12/2021 2229   HGBUR NEGATIVE 02/12/2021 2229   BILIRUBINUR NEGATIVE 02/12/2021 2229   BILIRUBINUR Negative 11/28/2017 0843   KETONESUR NEGATIVE 02/12/2021 2229   PROTEINUR NEGATIVE 02/12/2021 2229   NITRITE NEGATIVE 02/12/2021 2229   LEUKOCYTESUR LARGE (A) 02/12/2021 2229   Sepsis Labs: @LABRCNTIP (procalcitonin:4,lacticidven:4)  ) Recent Results (from the past 240 hour(s))  Resp Panel by RT-PCR (Flu A&B, Covid) Nasopharyngeal Swab     Status: None   Collection Time: 02/12/21 10:29 PM   Specimen: Nasopharyngeal Swab; Nasopharyngeal(NP) swabs in vial transport medium  Result Value Ref Range Status   SARS Coronavirus 2 by RT PCR NEGATIVE NEGATIVE Final    Comment: (NOTE) SARS-CoV-2 target nucleic acids are NOT DETECTED.  The SARS-CoV-2 RNA is generally detectable in upper respiratory specimens during the acute phase of infection. The lowest concentration of SARS-CoV-2 viral copies this assay can detect is 138 copies/mL. A negative result does not preclude SARS-Cov-2 infection and should not be used as the sole basis for treatment or other patient management decisions. A negative result may occur with  improper specimen collection/handling, submission of specimen other than nasopharyngeal swab, presence of viral mutation(s) within the areas targeted by  this assay, and inadequate number of viral copies(<138 copies/mL). A negative result must be combined with clinical observations, patient history, and epidemiological information. The expected result is Negative.  Fact Sheet for Patients:  EntrepreneurPulse.com.au  Fact Sheet for Healthcare Providers:  IncredibleEmployment.be  This test is no t yet approved or cleared by the [place] FDA and  has been authorized for detection and/or diagnosis of SARS-CoV-2 by FDA under an Emergency Use Authorization (EUA). This EUA will remain  in effect (meaning this test can be used) for the duration of the COVID-19 declaration under Section 564(b)(1) of the Act, 21 U.S.C.section 360bbb-3(b)(1), unless the authorization is terminated  or revoked sooner.       Influenza A by PCR NEGATIVE NEGATIVE Final   Influenza B by PCR NEGATIVE NEGATIVE Final    Comment: (NOTE) The Xpert Xpress SARS-CoV-2/FLU/RSV plus assay is intended as an aid in the diagnosis of influenza from Nasopharyngeal swab specimens and should not be used as a sole basis for treatment. Nasal washings and aspirates are unacceptable for Xpert Xpress SARS-CoV-2/FLU/RSV testing.  Fact Sheet for Patients: EntrepreneurPulse.com.au  Fact Sheet for Healthcare Providers: IncredibleEmployment.be  This test is not yet approved or cleared by  the [employer] and has been authorized for detection and/or diagnosis of SARS-CoV-2 by FDA under an Emergency Use Authorization (EUA). This EUA will remain in effect (meaning this test can be used) for the duration of the COVID-19 declaration under Section 564(b)(1) of the Act, 21 U.S.C. section 360bbb-3(b)(1), unless the authorization is terminated or revoked.  Performed at [facility], [address], [place], [place] [address]          Radiology Studies: DG Chest 2 View  Result Date:  02/12/2021 CLINICAL DATA:  Weakness. EXAM: CHEST - 2 VIEW COMPARISON:  12/09/2020 FINDINGS: Heart size and vascularity normal. Lungs clear without infiltrate or effusion Generator pack in the right lateral chest unchanged. Lead extends into the cervical spinal canal unchanged. IMPRESSION: No active cardiopulmonary disease. Electronically Signed   By: Franchot Gallo M.D.   On: 02/12/2021 17:33   CT HEAD WO CONTRAST (5MM)  Result Date: 02/13/2021 CLINICAL DATA:  Weakness. EXAM: CT HEAD WITHOUT CONTRAST TECHNIQUE: Contiguous axial images were obtained from the base of the skull through the vertex without intravenous contrast. COMPARISON:  December 12, 2016 FINDINGS: Brain: No evidence of acute infarction, hemorrhage, hydrocephalus, extra-axial collection or mass lesion/mass effect. Vascular: No hyperdense vessel or unexpected calcification. Skull: Normal. Negative for fracture or focal lesion. Sinuses/Orbits: No acute finding. Other: None. IMPRESSION: No acute intracranial pathology. Electronically Signed   By: Virgina Norfolk M.D.   On: 02/13/2021 02:35   CT Renal Stone Study  Result Date: 02/12/2021 CLINICAL DATA:  Flank pain EXAM: CT ABDOMEN AND PELVIS WITHOUT CONTRAST TECHNIQUE: Multidetector CT imaging of the abdomen and pelvis was performed following the standard protocol without IV contrast. COMPARISON:  CT chest 02/10/2021, and previous FINDINGS: Lower chest: Right flank subcutaneous implanted electronic device with catheter extending cephalad, incompletely visualized. No pleural or pericardial effusion. Visualized lung bases clear. Hepatobiliary: No focal liver abnormality is seen. Status post cholecystectomy. No biliary dilatation. Pancreas: Unremarkable. No pancreatic ductal dilatation or surrounding inflammatory changes. Spleen: Normal in size without focal abnormality. Adrenals/Urinary Tract: 1.4 cm left adrenal nodule, present on scans dating back to 04/09/2013, consistent with benign adenoma. No  urolithiasis or hydronephrosis. Urinary bladder nondistended. Stomach/Bowel: Stomach is partially distended, unremarkable. The small bowel is nondilated. Normal appendix. The colon is nondilated with probably a few scattered sigmoid diverticula, no adjacent inflammatory change or abscess. Vascular/Lymphatic: Mild aortoiliac calcified atheromatous plaque without aneurysm. No abdominal or pelvic adenopathy. Reproductive: Uterus and bilateral adnexa are unremarkable. Other: No ascites.  No free air. Musculoskeletal: No acute or significant osseous findings. IMPRESSION: 1. No acute findings. 2.  Aortic Atherosclerosis (ICD10-170.0). 3. Sigmoid diverticulosis. Electronically Signed   By: Lucrezia Europe M.D.   On: 02/12/2021 18:26        Scheduled Meds:  insulin aspart  0-15 Units Subcutaneous Q4H   [START ON 02/16/2021] pantoprazole  40 mg Intravenous Q12H   Continuous Infusions:  sodium chloride 125 mL/hr at 02/13/21 0344   [START ON 02/14/2021] cefTRIAXone (ROCEPHIN)  IV     pantoprazole 8 mg/hr (02/13/21 0410)     LOS: 0 days    Time spent: 62 min    Desma Maxim, MD [facility]   If 7PM-7AM, please contact night-coverage [email or web address] Password [facility] 02/13/2021, 8:38 AM
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
## 2021-02-13 NOTE — Anesthesia Postprocedure Evaluation (Signed)
Anesthesia Post Note  Patient: Alyssa Wood  Procedure(s) Performed: ESOPHAGOGASTRODUODENOSCOPY (EGD) WITH PROPOFOL  Anesthesia Type: General Anesthetic complications: no   There were no known notable events for this encounter.   Last Vitals:  Vitals:   02/13/21 1312 02/13/21 1354  BP: (!) 139/58   Pulse: (!) 58 88  Resp: 20 19  Temp: (!) 35.9 C (!) 35.7 C  SpO2:  94%    Last Pain:  Vitals:   02/13/21 1354  TempSrc:   PainSc: [place]

## 2021-02-13 NOTE — ED Notes (Signed)
Per lab type and screen hemolyzed; will send lab tech to recollect

## 2021-02-13 NOTE — H&P (Signed)
History and Physical    [name] EXH:[id number] DOB: Jul 04, 1960 DOA: 02/13/2021  PCP: [facility]   Patient coming from: home  I have personally briefly reviewed patient's relevant medical records in [place]  Chief Complaint: weakness  HPI: Alyssa Wood is a 61 y.o. female with medical history significant for Anxiety and depression, DM, HTN, chronic pain, history of H. pylori gastritis with positive breath test September 2022, and chronic NSAID use, as well as chronic abdominal pain and chronic diarrhea followed by gastroenterology, with recent history of UTI/pyelonephritis treated with Levaquin from 12/11-12/21 who presents to the ED due to persistent weakness, not improving following her treatment for UTI.  She also has continued epigastric pain, continued diarrhea which are black, and continued nausea with some vomiting.  She has had poor oral intake due to her symptoms and feels weak to where it is difficult to walk.  She saw her PCP earlier in the day with BP 109/69 and heart rate of 92 and sent her to the ED for further evaluation  ED course: Vitals WNL Blood work significant for: Sodium 128, creatinine 1.47 above baseline of 0.8, hemoglobin 9.4, down from 11.4 a couple months prior and abnormal UA  EKG, personally viewed and interpreted: NSR at 73 with no acute ST-T wave changes  Imaging: CT renal stone study nonacute CT head with no intracranial pathology Chest x-ray nonacute  Patient started on Rocephin for possible UTI and also started on IV hydration.  Hospitalist consulted for admission.   Review of Systems: As per HPI otherwise all other systems on review of systems negative.   Assessment/Plan  Acute on chronic abdominal pain/gastritis - Suspect secondary to NSAID use, possible PUD given history of H. pylori breath test 10/2020 s/p treatment - We will keep n.p.o. - IV Protonix - GI consult for consideration of  endoscopy  Acute on chronic diarrhea - Seen by GI in the past with consideration for IBS and possible metformin side effect - Stool for C. difficile given recent treatment with Levaquin - GI consulted    Generalized weakness - Secondary to dehydration - Treat acute illness - Hold home diuretics  Hyponatremia - Likely related to dehydration - IV hydration with NS and monitor    AKI (acute kidney injury) ([place]) - Secondary to gastritis, diarrhea - IV hydration - Monitor renal function and avoid nephrotoxins - Hold home metformin, meloxicam, lisinopril/hydrochlorothiazide, gabapentin    Anemia, possible acute blood loss anemia - Stool for occult blood in the ED negative - Monitor hemoglobin - GI consult to evaluate for colonoscopy  Possible UTI - Received IV Rocephin in the ED - Follow culture    Anxiety with depression - Continue home sertraline   DVT prophylaxis: SCDs  code Status: full code  Family Communication:  none  Disposition Plan: Back to previous home environment Consults called: GI Status:At the time of admission, it appears that the appropriate admission status for this patient is INPATIENT. This is judged to be reasonable and necessary in order to provide the required intensity of service to ensure the patient's safety given the presenting symptoms, physical exam findings, and initial radiographic and laboratory data in the context of their  Comorbid conditions.   Patient requires inpatient status due to high intensity of service, high risk for further deterioration and high frequency of surveillance required.   I certify that at the point of admission it is my clinical judgment that the patient will require inpatient hospital care  spanning beyond 2 midnights     Physical Exam: Vitals:   02/12/21 1634 02/12/21 2045 02/12/21 2234 02/13/21 0112  BP: 129/63 (!) 115/54 118/61 122/61  Pulse: 71 72 71 71  Resp: 20 18 20 20   Temp: 98.9 F (37.2 C)   98.4 F  (36.9 C)  TempSrc: Oral   Oral  SpO2: 99% 95% 97% 97%   Constitutional: Ill-appearing, oriented x 3 . Not in any apparent distress HEENT:      Head: Normocephalic and atraumatic.         Eyes: PERLA, EOMI, Conjunctivae are normal. Sclera is non-icteric.       Mouth/Throat: Mucous membranes are moist.       Neck: Supple with no signs of meningismus. Cardiovascular: Regular rate and rhythm. No murmurs, gallops, or rubs. 2+ symmetrical distal pulses are present . No JVD. No  LE edema Respiratory: Respiratory effort normal .Lungs sounds clear bilaterally. No wheezes, crackles, or rhonchi.  Gastrointestinal: Soft, mild epigastric tenderness, non distended. Positive bowel sounds.  Genitourinary: No CVA tenderness. Musculoskeletal: Nontender with normal range of motion in all extremities. No cyanosis, or erythema of extremities. Neurologic:  Face is symmetric. Moving all extremities. No gross focal neurologic deficits . Skin: Skin is warm, dry.  No rash or ulcers Psychiatric: Mood and affect are appropriate     Past Medical History:  Diagnosis Date   Anxiety    Chronic pain    Depression    Diabetes mellitus without complication ([facility])    GERD (gastroesophageal reflux disease)    Hypertension     Past Surgical History:  Procedure Laterality Date   CHOLECYSTECTOMY  1992   COLONOSCOPY WITH PROPOFOL N/A 08/28/2015   Procedure: COLONOSCOPY WITH PROPOFOL;  Surgeon: [name], MD;  Location: [facility];  Service: Endoscopy;  Laterality: N/A;  PER PT PLEASE LEAVE LAST NEEDS INTERPRETER   POLYPECTOMY  08/28/2015   Procedure: POLYPECTOMY;  Surgeon: [name], MD;  Location: [facility];  Service: Endoscopy;;   SHOULDER SURGERY Right    SPINAL CORD STIMULATOR IMPLANT Right Jan 2013   WRIST SURGERY Right    right hand and wrist reconstruction     reports that she has never smoked. She has never used smokeless tobacco. She reports that she does not drink alcohol and does  not use drugs.  Allergies  Allergen Reactions   Trazodone And Nefazodone Anaphylaxis   Hydrocodone Nausea And Vomiting    Family History  Problem Relation Age of Onset   Cancer Father    Heart disease Father    Breast cancer Neg Hx       Prior to Admission medications   Medication Sig Start Date End Date Taking? Authorizing Provider  amLODipine (NORVASC) 2.5 MG tablet Take 1 tablet (2.5 mg total) by mouth daily. 08/17/19 10/27/20  Duffy Bruce, MD  Calcium Carbonate-Vitamin D 600-400 MG-UNIT tablet Take 1 tablet by mouth 2 (two) times daily.     [provider]  clobetasol ointment (TEMOVATE) 0.05 % Apply twice daily to rash on hands when present. 04/02/19   [provider]  Cyanocobalamin (VITAMIN B 12 PO) Take 1 tablet by mouth daily.    [provider]  gabapentin (NEURONTIN) 300 MG capsule Take 1 capsule (300 mg total) by mouth at bedtime. Follow written titration schedule 04/01/20 10/27/20  Milinda Pointer, MD  hydrOXYzine (ATARAX/VISTARIL) 25 MG tablet Take 1 tablet (25 mg total) by mouth every 8 (eight) hours as needed for anxiety. 08/17/19  Duffy Bruce, MD  lisinopril-hydrochlorothiazide (PRINZIDE,ZESTORETIC) 20-12.5 MG tablet Take 1 tablet by mouth daily.     [provider]  meloxicam (MOBIC) 15 MG tablet Take 1 tablet (15 mg total) by mouth daily. 04/01/20 10/27/20  Milinda Pointer, MD  metFORMIN (GLUCOPHAGE) 500 MG tablet Take 500 mg by mouth 2 (two) times daily with a meal.    [provider]  omega-3 acid ethyl esters (LOVAZA) 1 g capsule Take by mouth 2 (two) times daily.    [provider]  omeprazole (PRILOSEC) 20 MG capsule Take 2 capsules (40 mg total) by mouth daily. 10/27/20   Jonathon Bellows, MD  omeprazole (PRILOSEC) 20 MG capsule Take 1 capsule (20 mg total) by mouth 2 (two) times daily before a meal for 14 days. 11/03/20 11/17/20  Jonathon Bellows, MD  rosuvastatin (CRESTOR) 10 MG tablet Take 10 mg by mouth at  bedtime. 07/02/20   [provider]  sertraline (ZOLOFT) 25 MG tablet Take 25 mg by mouth daily.     [provider]  Sod Picosulfate-Mag Ox-Cit Acd (CLENPIQ) 10-3.5-12 MG-GM -GM/160ML SOLN Take 1 bottle at 5 PM followed by five 8 oz cups of water and repeat 5 hours before procedure. 10/27/20   Jonathon Bellows, MD  traMADol (ULTRAM) 50 MG tablet Take 1 tablet (50 mg total) by mouth 2 (two) times daily. 04/01/20 10/27/20  Milinda Pointer, MD      Labs on Admission: I have personally reviewed following labs and imaging studies  CBC: Recent Labs  Lab 02/12/21 1639  WBC 9.2  HGB 9.4*  HCT 29.0*  MCV 84.1  PLT 845   Basic Metabolic Panel: Recent Labs  Lab 02/12/21 1639  NA 128*  K 4.1  CL 96*  CO2 25  GLUCOSE 88  BUN 19  CREATININE 1.47*  CALCIUM 9.5   GFR: CrCl cannot be calculated (Unknown ideal weight.). Liver Function Tests: Recent Labs  Lab 02/12/21 1639  AST 24  ALT 16  ALKPHOS 88  BILITOT 0.5  PROT 9.1*  ALBUMIN 3.8   Recent Labs  Lab 02/12/21 1639  LIPASE 32   No results for input(s): AMMONIA in the last 168 hours. Coagulation Profile: Recent Labs  Lab 02/13/21 0210  INR 1.0   Cardiac Enzymes: No results for input(s): CKTOTAL, CKMB, CKMBINDEX, TROPONINI in the last 168 hours. BNP (last 3 results) No results for input(s): PROBNP in the last 8760 hours. HbA1C: No results for input(s): HGBA1C in the last 72 hours. CBG: No results for input(s): GLUCAP in the last 168 hours. Lipid Profile: No results for input(s): CHOL, HDL, LDLCALC, TRIG, CHOLHDL, LDLDIRECT in the last 72 hours. Thyroid Function Tests: No results for input(s): TSH, T4TOTAL, FREET4, T3FREE, THYROIDAB in the last 72 hours. Anemia Panel: No results for input(s): VITAMINB12, FOLATE, FERRITIN, TIBC, IRON, RETICCTPCT in the last 72 hours. Urine analysis:    Component Value Date/Time   COLORURINE YELLOW (A) 02/12/2021 2229   APPEARANCEUR HAZY (A) 02/12/2021 2229    APPEARANCEUR Clear 11/28/2017 0843   LABSPEC 1.008 02/12/2021 2229   PHURINE 6.0 02/12/2021 2229   GLUCOSEU NEGATIVE 02/12/2021 2229   HGBUR NEGATIVE 02/12/2021 2229   BILIRUBINUR NEGATIVE 02/12/2021 2229   BILIRUBINUR Negative 11/28/2017 [place] 02/12/2021 2229   PROTEINUR NEGATIVE 02/12/2021 2229   NITRITE NEGATIVE 02/12/2021 2229   LEUKOCYTESUR LARGE (A) 02/12/2021 2229    Radiological Exams on Admission: DG Chest 2 View  Result Date: 02/12/2021 CLINICAL DATA:  Weakness. EXAM: CHEST -  2 VIEW COMPARISON:  12/09/2020 FINDINGS: Heart size and vascularity normal. Lungs clear without infiltrate or effusion Generator pack in the right lateral chest unchanged. Lead extends into the cervical spinal canal unchanged. IMPRESSION: No active cardiopulmonary disease. Electronically Signed   By: Franchot Gallo M.D.   On: 02/12/2021 17:33   CT HEAD WO CONTRAST (5MM)  Result Date: 02/13/2021 CLINICAL DATA:  Weakness. EXAM: CT HEAD WITHOUT CONTRAST TECHNIQUE: Contiguous axial images were obtained from the base of the skull through the vertex without intravenous contrast. COMPARISON:  December 12, 2016 FINDINGS: Brain: No evidence of acute infarction, hemorrhage, hydrocephalus, extra-axial collection or mass lesion/mass effect. Vascular: No hyperdense vessel or unexpected calcification. Skull: Normal. Negative for fracture or focal lesion. Sinuses/Orbits: No acute finding. Other: None. IMPRESSION: No acute intracranial pathology. Electronically Signed   By: Virgina Norfolk M.D.   On: 02/13/2021 02:35   CT Renal Stone Study  Result Date: 02/12/2021 CLINICAL DATA:  Flank pain EXAM: CT ABDOMEN AND PELVIS WITHOUT CONTRAST TECHNIQUE: Multidetector CT imaging of the abdomen and pelvis was performed following the standard protocol without IV contrast. COMPARISON:  CT chest 02/10/2021, and previous FINDINGS: Lower chest: Right flank subcutaneous implanted electronic device with catheter extending  cephalad, incompletely visualized. No pleural or pericardial effusion. Visualized lung bases clear. Hepatobiliary: No focal liver abnormality is seen. Status post cholecystectomy. No biliary dilatation. Pancreas: Unremarkable. No pancreatic ductal dilatation or surrounding inflammatory changes. Spleen: Normal in size without focal abnormality. Adrenals/Urinary Tract: 1.4 cm left adrenal nodule, present on scans dating back to 04/09/2013, consistent with benign adenoma. No urolithiasis or hydronephrosis. Urinary bladder nondistended. Stomach/Bowel: Stomach is partially distended, unremarkable. The small bowel is nondilated. Normal appendix. The colon is nondilated with probably a few scattered sigmoid diverticula, no adjacent inflammatory change or abscess. Vascular/Lymphatic: Mild aortoiliac calcified atheromatous plaque without aneurysm. No abdominal or pelvic adenopathy. Reproductive: Uterus and bilateral adnexa are unremarkable. Other: No ascites.  No free air. Musculoskeletal: No acute or significant osseous findings. IMPRESSION: 1. No acute findings. 2.  Aortic Atherosclerosis (ICD10-170.0). 3. Sigmoid diverticulosis. Electronically Signed   By: Lucrezia Europe M.D.   On: 02/12/2021 18:26       Athena Masse MD [facility]   02/13/2021, 3:33 AM

## 2021-02-13 NOTE — ED Notes (Signed)
Pt assisted to bathroom

## 2021-02-13 NOTE — Interval H&P Note (Signed)
History and Physical Interval Note: Preprocedure H&P from 02/13/21  was reviewed and there was no interval change after seeing and examining the patient.  Written consent was obtained via medical interpreter from the patient after discussion of risks, benefits, and alternatives. Patient has consented to proceed with Esophagogastroduodenoscopy with possible intervention   02/13/2021 1:39 PM  Alyssa Wood  has presented today for surgery, with the diagnosis of melena, anemia, h pylori.  The various methods of treatment have been discussed with the patient and family. After consideration of risks, benefits and other options for treatment, the patient has consented to  Procedure(s): ESOPHAGOGASTRODUODENOSCOPY (EGD) WITH PROPOFOL (N/A) as a surgical intervention.  The patient's history has been reviewed, patient examined, no change in status, stable for surgery.  I have reviewed the patient's chart and labs.  Questions were answered to the patient's satisfaction.     [name]

## 2021-02-13 NOTE — Transfer of Care (Signed)
Immediate Anesthesia Transfer of Care Note  Patient: Alyssa Wood  Procedure(s) Performed: ESOPHAGOGASTRODUODENOSCOPY (EGD) WITH PROPOFOL  Patient Location: Endoscopy Unit  Anesthesia Type:General  Level of Consciousness: awake  Airway & Oxygen Therapy: Patient Spontanous Breathing  Post-op Assessment: Report given to RN and Post -op Vital signs reviewed and stable  Post vital signs: Reviewed  Last Vitals:  Vitals Value Taken Time  BP 102/56 02/13/21 1355  Temp    Pulse 89 02/13/21 1355  Resp 15 02/13/21 1355  SpO2 94 % 02/13/21 1355  Vitals shown include unvalidated device data.  Last Pain:  Vitals:   02/13/21 1312  TempSrc:   PainSc: 0-No pain      Patients Stated Pain Goal: 0 (72/09/19 8022)  Complications: No notable events documented.
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
## 2021-02-13 NOTE — Anesthesia Preprocedure Evaluation (Addendum)
Anesthesia Evaluation  Patient identified by MRN, date of birth, ID band Patient awake    Reviewed: Allergy & Precautions, Patient's Chart, lab work & pertinent test results  Airway Mallampati: II  TM Distance: >3 FB Neck ROM: Full    Dental no notable dental hx. (+) Upper Dentures, Lower Dentures   Pulmonary    Pulmonary exam normal breath sounds clear to auscultation       Cardiovascular hypertension, Normal cardiovascular exam Rhythm:Regular Rate:Normal  EKG  NSR  HLD   Neuro/Psych  Headaches, Anxiety    GI/Hepatic GERD  ,  Endo/Other  diabetes  Renal/GU Renal InsufficiencyRenal disease (CR:  1.47  )     Musculoskeletal  (+) Arthritis ,   Abdominal   Peds  Hematology  (+) anemia ,   Anesthesia Other Findings   Reproductive/Obstetrics                           Anesthesia Physical Anesthesia Plan  ASA: 3  Anesthesia Plan: General   Post-op Pain Management:    Induction: Intravenous  PONV Risk Score and Plan:   Airway Management Planned: Natural Airway and Nasal Cannula  Additional Equipment:   Intra-op Plan:   Post-operative Plan:   Informed Consent: I have reviewed the patients History and Physical, chart, labs and discussed the procedure including the risks, benefits and alternatives for the proposed anesthesia with the patient or authorized representative who has indicated his/her understanding and acceptance.     Dental Advisory Given  Plan Discussed with: Anesthesiologist, CRNA and Surgeon  Anesthesia Plan Comments: (Patient consented for risks of anesthesia including but not limited to:  - adverse reactions to medications - risk of airway placement if required - damage to eyes, teeth, lips or other oral mucosa - nerve damage due to positioning  - sore throat or hoarseness - Damage to heart, brain, nerves, lungs, other parts of body or loss of life  Patient  voiced understanding.)        Anesthesia Quick Evaluation

## 2021-02-13 NOTE — ED Notes (Signed)
MD bedside

## 2021-02-13 NOTE — ED Provider Notes (Addendum)
[facility] Provider Note    Event Date/Time   First MD Initiated Contact with Patient 02/13/21 0129     (approximate)   History   Weakness   HPI  Alyssa Wood is a 61 y.o. female with history of hypertension, diabetes, GERD, anxiety, chronic pain who presents to the emergency department for evaluation for complaints of abdominal pain and weakness.  History is provided by patient and also notes provided from [place].  Spanish interpreter used throughout visit.  Patient states that she has not been feeling well for 2 weeks.  She recently had UTI/pyelonephritis at the end of November/early December and had fever, back pain, dysuria.  She took Levaquin for 10 days from 01/18/2021 to 01/28/2021 but continues to have dysuria, back pain.  She also reports that she has been having intermittent severe headaches and has felt very weak.  No focal weakness, numbness.  No head injury.  Not on blood thinners.  She has been taking ibuprofen daily for 2 weeks and is now having upper abdominal pain, burning and is vomiting.  She states she has also had diarrhea for a few days and her stools are black and tarry the past 2 to 3 days.  She is not on iron supplementation or taking any Pepto-Bismol.  She states her last colonoscopy was in 2018.  She has never had an endoscopy.  States she is supposed to be seeing gastroenterology as an outpatient in the next 1 to 2 weeks.  She states that she is feeling extremely weak and has a hard time walking due to her weakness.  Patient has had previous cholecystectomy.  Has had previous back surgery and has a nerve stimulator in place.  Per primary care doctor notes, they were concerned about significant dehydration, UTI versus pyelonephritis, C. difficile.  At her appointment her blood pressure was 109/69 with a heart rate of 92.  They felt that she needed IV fluids, antiemetics, imaging.   Patient denies any  known fevers.  No chest pain, shortness of breath or cough.  She denies bloody stools.  No vaginal bleeding.      Past Medical History:  Diagnosis Date   Anxiety    Chronic pain    Depression    Diabetes mellitus without complication (HCC)    GERD (gastroesophageal reflux disease)    Hypertension     Past Surgical History:  Procedure Laterality Date   CHOLECYSTECTOMY  1992   COLONOSCOPY WITH PROPOFOL N/A 08/28/2015   Procedure: COLONOSCOPY WITH PROPOFOL;  Surgeon: [name], MD;  Location: [place];  Service: Endoscopy;  Laterality: N/A;  PER PT PLEASE LEAVE LAST NEEDS INTERPRETER   POLYPECTOMY  08/28/2015   Procedure: POLYPECTOMY;  Surgeon: [name], MD;  Location: [place];  Service: Endoscopy;;   SHOULDER SURGERY Right    SPINAL CORD STIMULATOR IMPLANT Right Jan 2013   WRIST SURGERY Right    right hand and wrist reconstruction    MEDICATIONS:  Prior to Admission medications   Medication Sig Start Date End Date Taking? Authorizing Provider  amLODipine (NORVASC) 2.5 MG tablet Take 1 tablet (2.5 mg total) by mouth daily. 08/17/19 10/27/20  Duffy Bruce, MD  Calcium Carbonate-Vitamin D 600-400 MG-UNIT tablet Take 1 tablet by mouth 2 (two) times daily.     [provider]  clobetasol ointment (TEMOVATE) 0.05 % Apply twice daily to rash on hands when present. 04/02/19   [provider]  Cyanocobalamin (VITAMIN  B 12 PO) Take 1 tablet by mouth daily.    [provider]  gabapentin (NEURONTIN) 300 MG capsule Take 1 capsule (300 mg total) by mouth at bedtime. Follow written titration schedule 04/01/20 10/27/20  Milinda Pointer, MD  hydrOXYzine (ATARAX/VISTARIL) 25 MG tablet Take 1 tablet (25 mg total) by mouth every 8 (eight) hours as needed for anxiety. 08/17/19   Duffy Bruce, MD  lisinopril-hydrochlorothiazide (PRINZIDE,ZESTORETIC) 20-12.5 MG tablet Take 1 tablet by mouth daily.     [provider]  meloxicam (MOBIC) 15  MG tablet Take 1 tablet (15 mg total) by mouth daily. 04/01/20 10/27/20  Milinda Pointer, MD  metFORMIN (GLUCOPHAGE) 500 MG tablet Take 500 mg by mouth 2 (two) times daily with a meal.    [provider]  omega-3 acid ethyl esters (LOVAZA) 1 g capsule Take by mouth 2 (two) times daily.    [provider]  omeprazole (PRILOSEC) 20 MG capsule Take 2 capsules (40 mg total) by mouth daily. 10/27/20   Jonathon Bellows, MD  omeprazole (PRILOSEC) 20 MG capsule Take 1 capsule (20 mg total) by mouth 2 (two) times daily before a meal for 14 days. 11/03/20 11/17/20  Jonathon Bellows, MD  rosuvastatin (CRESTOR) 10 MG tablet Take 10 mg by mouth at bedtime. 07/02/20   [provider]  sertraline (ZOLOFT) 25 MG tablet Take 25 mg by mouth daily.     [provider]  Sod Picosulfate-Mag Ox-Cit Acd (CLENPIQ) 10-3.5-12 MG-GM -GM/160ML SOLN Take 1 bottle at 5 PM followed by five 8 oz cups of water and repeat 5 hours before procedure. 10/27/20   Jonathon Bellows, MD  traMADol (ULTRAM) 50 MG tablet Take 1 tablet (50 mg total) by mouth 2 (two) times daily. 04/01/20 10/27/20  Milinda Pointer, MD    Physical Exam   Triage Vital Signs: ED Triage Vitals [02/12/21 1634]  Enc Vitals Group     BP 129/63     Pulse Rate 71     Resp 20     Temp 98.9 F (37.2 C)     Temp Source Oral     SpO2 99 %     Weight      Height      Head Circumference      Peak Flow      Pain Score 6     Pain Loc      Pain Edu?      Excl. in [place]?     Most recent vital signs: Vitals:   02/12/21 2234 02/13/21 0112  BP: 118/61 122/61  Pulse: 71 71  Resp: 20 20  Temp:  98.4 F (36.9 C)  SpO2: 97% 97%    CONSTITUTIONAL: Alert and oriented and responds appropriately to questions.  Appears uncomfortable but nontoxic HEAD: Normocephalic, atraumatic EYES: Conjunctivae clear, pupils appear equal, sclera nonicteric ENT: normal nose; moist mucous membranes NECK: Supple, normal ROM CARD: RRR; S1 and S2 appreciated; no  murmurs, no clicks, no rubs, no gallops RESP: Normal chest excursion without splinting or tachypnea; breath sounds clear and equal bilaterally; no wheezes, no rhonchi, no rales, no hypoxia or respiratory distress, speaking full sentences ABD/GI: Normal bowel sounds; non-distended; soft, fusilli tender to palpation without guarding or rebound RECTAL:  Normal rectal tone, no gross blood or melena, guaiac negative but only very minimal amount of stool in the rectal vault, no hemorrhoids appreciated, nontender rectal exam, no fecal impaction. Chaperone present. BACK: The back appears normal, no CVA tenderness EXT: Normal ROM in all  joints; no deformity noted, no edema; no cyanosis SKIN: Normal color for age and race; warm; no rash on exposed skin NEURO: Moves all extremities equally, normal speech, no facial asymmetry PSYCH: The patient's mood and manner are appropriate.   ED Results / Procedures / Treatments   LABS: (all labs ordered are listed, but only abnormal results are displayed) Labs Reviewed  BASIC METABOLIC PANEL - Abnormal; Notable for the following components:      Result Value   Sodium 128 (*)    Chloride 96 (*)    Creatinine, Ser 1.47 (*)    GFR, Estimated 40 (*)    All other components within normal limits  CBC - Abnormal; Notable for the following components:   RBC 3.45 (*)    Hemoglobin 9.4 (*)    HCT 29.0 (*)    All other components within normal limits  HEPATIC FUNCTION PANEL - Abnormal; Notable for the following components:   Total Protein 9.1 (*)    All other components within normal limits  URINALYSIS, COMPLETE (UACMP) WITH MICROSCOPIC - Abnormal; Notable for the following components:   Color, Urine YELLOW (*)    APPearance HAZY (*)    Leukocytes,Ua LARGE (*)    WBC, UA >50 (*)    Bacteria, UA MANY (*)    All other components within normal limits  RESP PANEL BY RT-PCR (FLU A&B, COVID) ARPGX2  URINE CULTURE  GASTROINTESTINAL PANEL BY PCR, STOOL (REPLACES STOOL  CULTURE)  C DIFFICILE QUICK SCREEN W PCR REFLEX    LIPASE, BLOOD  PROTIME-INR  VITAMIN B12  FOLATE  IRON AND TIBC  FERRITIN  RETICULOCYTES  TYPE AND SCREEN  TYPE AND SCREEN  TROPONIN I (HIGH SENSITIVITY)  TROPONIN I (HIGH SENSITIVITY)     EKG:  EKG Interpretation  Date/Time:    Ventricular Rate:    PR Interval:    QRS Duration:   QT Interval:    QTC Calculation:   R Axis:     Text Interpretation:           RADIOLOGY: My personal review and interpretation of imaging: Chest x-ray shows no infiltrate, edema or pneumothorax.  CT of the abdomen pelvis shows no kidney stone or signs of pyelonephritis.  I have personally reviewed all radiology reports.   DG Chest 2 View  Result Date: 02/12/2021 CLINICAL DATA:  Weakness. EXAM: CHEST - 2 VIEW COMPARISON:  12/09/2020 FINDINGS: Heart size and vascularity normal. Lungs clear without infiltrate or effusion Generator pack in the right lateral chest unchanged. Lead extends into the cervical spinal canal unchanged. IMPRESSION: No active cardiopulmonary disease. Electronically Signed   By: Franchot Gallo M.D.   On: 02/12/2021 17:33   CT HEAD WO CONTRAST (5MM)  Result Date: 02/13/2021 CLINICAL DATA:  Weakness. EXAM: CT HEAD WITHOUT CONTRAST TECHNIQUE: Contiguous axial images were obtained from the base of the skull through the vertex without intravenous contrast. COMPARISON:  December 12, 2016 FINDINGS: Brain: No evidence of acute infarction, hemorrhage, hydrocephalus, extra-axial collection or mass lesion/mass effect. Vascular: No hyperdense vessel or unexpected calcification. Skull: Normal. Negative for fracture or focal lesion. Sinuses/Orbits: No acute finding. Other: None. IMPRESSION: No acute intracranial pathology. Electronically Signed   By: Virgina Norfolk M.D.   On: 02/13/2021 02:35   CT Renal Stone Study  Result Date: 02/12/2021 CLINICAL DATA:  Flank pain EXAM: CT ABDOMEN AND PELVIS WITHOUT CONTRAST TECHNIQUE: Multidetector CT  imaging of the abdomen and pelvis was performed following the standard protocol without IV contrast. COMPARISON:  CT chest 02/10/2021, and previous FINDINGS: Lower chest: Right flank subcutaneous implanted electronic device with catheter extending cephalad, incompletely visualized. No pleural or pericardial effusion. Visualized lung bases clear. Hepatobiliary: No focal liver abnormality is seen. Status post cholecystectomy. No biliary dilatation. Pancreas: Unremarkable. No pancreatic ductal dilatation or surrounding inflammatory changes. Spleen: Normal in size without focal abnormality. Adrenals/Urinary Tract: 1.4 cm left adrenal nodule, present on scans dating back to 04/09/2013, consistent with benign adenoma. No urolithiasis or hydronephrosis. Urinary bladder nondistended. Stomach/Bowel: Stomach is partially distended, unremarkable. The small bowel is nondilated. Normal appendix. The colon is nondilated with probably a few scattered sigmoid diverticula, no adjacent inflammatory change or abscess. Vascular/Lymphatic: Mild aortoiliac calcified atheromatous plaque without aneurysm. No abdominal or pelvic adenopathy. Reproductive: Uterus and bilateral adnexa are unremarkable. Other: No ascites.  No free air. Musculoskeletal: No acute or significant osseous findings. IMPRESSION: 1. No acute findings. 2.  Aortic Atherosclerosis (ICD10-170.0). 3. Sigmoid diverticulosis. Electronically Signed   By: Lucrezia Europe M.D.   On: 02/12/2021 18:26     PROCEDURES:  Critical Care performed: YES  CRITICAL CARE Performed by: Cyril Mourning Shevon Sian   Total critical care time: 45 minutes  Critical care time was exclusive of separately billable procedures and treating other patients.  Critical care was necessary to treat or prevent imminent or life-threatening deterioration.  Critical care was time spent personally by me on the following activities: development of treatment plan with patient and/or surrogate as well as nursing,  discussions with consultants, evaluation of patient's response to treatment, examination of patient, obtaining history from patient or surrogate, ordering and performing treatments and interventions, ordering and review of laboratory studies, ordering and review of radiographic studies, pulse oximetry and re-evaluation of patient's condition.     [name]1-3 Lead EKG Interpretation Performed by: Lamees Gable, Delice Bison, DO Authorized by: Terresa Marlett, Delice Bison, DO     Interpretation: normal     ECG rate:  65   ECG rate assessment: normal     Rhythm: sinus rhythm     Ectopy: none     Conduction: normal      IMPRESSION / MDM / ASSESSMENT AND PLAN / ED COURSE  I reviewed the triage vital signs and the nursing notes.    Patient here with complaints of worsening abdominal pain, nausea, vomiting and diarrhea, and continued urinary symptoms despite recent 10-day course of Levaquin and now having black and tarry stools and feeling very weak.  The patient is on the cardiac monitor to evaluate for evidence of arrhythmia and/or significant heart rate changes.   DIFFERENTIAL DIAGNOSIS (includes but not limited to):   UTI, pyelonephritis, infected kidney stone, appendicitis, colitis, diverticulitis, C. difficile, infectious diarrhea, GI bleed,, GERD, gastritis, H. pylori, peptic ulcer, anemia, ACS, intracranial hemorrhage, CVA, CVT, dehydration   PLAN: We will obtain labs, urine, urine cultures, CT of abdomen pelvis, CT of the head.  Will give IV fluids.  Patient may need admission.   MEDICATIONS GIVEN IN ED: Medications  fentaNYL (SUBLIMAZE) injection 50 mcg (has no administration in time range)  ondansetron (ZOFRAN) injection 4 mg (has no administration in time range)  0.9 %  sodium chloride infusion (has no administration in time range)  pantoprazole (PROTONIX) 80 mg /NS 100 mL IVPB (has no administration in time range)  pantoprozole (PROTONIX) 80 mg /NS 100 mL infusion (has no administration in time range)   pantoprazole (PROTONIX) injection 40 mg (has no administration in time range)  acetaminophen (TYLENOL) tablet 650 mg (650 mg Oral Given 02/12/21  2240)  cefTRIAXone (ROCEPHIN) 1 g in sodium chloride 0.9 % 100 mL IVPB (0 g Intravenous Stopped 02/13/21 0259)  sodium chloride 0.9 % bolus 1,000 mL (0 mLs Intravenous Stopped 02/13/21 0259)     ED COURSE: Patient's labs show that she now has a new anemia with a hemoglobin of 9.4 down from 11.2 on 01/09/2021.  I did perform a rectal exam and she has no gross blood or melena but she has only very minimal stool in the rectal vault.  I am concerned that she is having a GI bleed given she is complaining of upper abdominal pain and reports taking NSAIDs regularly for headache.  This could be gastritis, peptic ulcer disease, H. pylori.  We will start her on IV Protonix.  LFTs, lipase within normal limits.  CT of the abdomen pelvis reviewed by myself and radiology shows normal-appearing appendix.  No kidney stones or pyelonephritis.  Urine appears infected today.  She is getting IV Rocephin.  She is getting pain and nausea medicine here.  Also has a mild AKI today.  Patient is getting IV fluids.  This could be from GI loss as she states vomiting and diarrhea.  She has not had any diarrhea here for [place] to collect a stool sample.  We did discuss that C. difficile is a possibility especially given recent fluoroquinolone use.  Troponin is negative.  This seems atypical for ACS.  She is not having chest pain or shortness of breath currently.  EKG is nonischemic without arrhythmia or interval abnormality.  Chest x-ray obtained from triage reviewed by myself and radiology shows no acute abnormality.    CT of the head obtained given complaints of severe headache which shows no acute intracranial pathology when interpreted by myself and radiology.  No intracranial hemorrhage.  Neurologically intact here.  Will admit patient to the hospital for UTI, generalized weakness, anemia,  possible upper GI bleed, AKI.  CONSULTS:  Consulted and discussed patient's case with hospitalist, Dr. Damita Dunnings.  I have recommended admission and consulting physician agrees and will place admission orders.  Patient (and family if present) agree with this plan.   I reviewed all nursing notes, vitals, pertinent previous records.  All labs, EKGs, imaging ordered have been independently reviewed and interpreted by myself.    OUTSIDE RECORDS REVIEWED: I have reviewed previous records.  Last urine culture was in October 2019 and showed mixed urogenital flora.  I have reviewed records sent over from [facility].         FINAL CLINICAL IMPRESSION(S) / ED DIAGNOSES   Final diagnoses:  Acute UTI  Generalized weakness  Bad headache  Nausea vomiting and diarrhea  Melena  Anemia, unspecified type  AKI (acute kidney injury) ([place])     Rx / DC Orders   ED Discharge Orders     None        Note:  This document was prepared using Dragon voice recognition software and may include unintentional dictation errors.   Ural Acree, Delice Bison, DO 02/13/21 0310    Trelyn Vanderlinde, Delice Bison, DO 02/13/21 4128

## 2021-02-14 DIAGNOSIS — K921 Melena: Secondary | ICD-10-CM | POA: Diagnosis not present

## 2021-02-14 DIAGNOSIS — D649 Anemia, unspecified: Secondary | ICD-10-CM

## 2021-02-14 LAB — BASIC METABOLIC PANEL WITH GFR
Anion gap: 3 — ABNORMAL LOW (ref 5–15)
BUN: 11 mg/dL (ref 8–23)
CO2: 23 mmol/L (ref 22–32)
Calcium: 8.5 mg/dL — ABNORMAL LOW (ref 8.9–10.3)
Chloride: 110 mmol/L (ref 98–111)
Creatinine, Ser: 1.14 mg/dL — ABNORMAL HIGH (ref 0.44–1.00)
GFR, Estimated: 54 mL/min — ABNORMAL LOW (ref >60)
Glucose, Bld: 82 mg/dL (ref 70–99)
Potassium: 4.1 mmol/L (ref 3.5–5.1)
Sodium: 136 mmol/L (ref 135–145)

## 2021-02-14 LAB — CBC
HCT: 25.3 % — ABNORMAL LOW (ref 36.0–46.0)
Hemoglobin: 8.2 g/dL — ABNORMAL LOW (ref 12.0–15.0)
MCH: 27.5 pg (ref 26.0–34.0)
MCHC: 32.4 g/dL (ref 30.0–36.0)
MCV: 84.9 fL (ref 80.0–100.0)
Platelets: 299 10*3/uL (ref 150–400)
RBC: 2.98 MIL/uL — ABNORMAL LOW (ref 3.87–5.11)
RDW: 13.3 % (ref 11.5–15.5)
WBC: 6.4 10*3/uL (ref 4.0–10.5)
nRBC: 0 % (ref 0.0–0.2)

## 2021-02-14 MED ORDER — SODIUM CHLORIDE 0.9 % IV SOLN: INTRAVENOUS | Status: DC

## 2021-02-14 MED ORDER — PEG 3350-KCL-NA BICARB-NACL 420 G PO SOLR
4000.0000 mL | Freq: Once | ORAL | Status: AC
  Administered 2021-02-14: 4000 mL via ORAL
  Filled 2021-02-14: qty 4000

## 2021-02-14 NOTE — Plan of Care (Signed)

## 2021-02-14 NOTE — Progress Notes (Signed)
PROGRESS NOTE    Alyssa Wood  DTO:[id number] DOB: 1960-08-22 DOA: 02/13/2021 [facility]CP: [facility]  Outpatient Specialists: gastroenterology    Brief Narrative:   Hx dm, htn, chronic pain, chronic nsaids, recent seen by gi and treated for h pylor, also recent treatment for pyelonephritis, coming in with several days weakness, anorexia, nausea/vomiting/diarrhea, epigastric pain, and dysuria. Found to have aki, hyponatremia, new anemia.   Assessment & Plan:   Principal Problem:   Acute gastroenteritis Active Problems:   Anxiety with depression   Hyponatremia   AKI (acute kidney injury) ([place])   Anemia   Generalized weakness  # Nausea/vomiting/diarrhea # Epigastric pain Initial concern for gastric pathology given epigastric pain, recent h pylori, chronic nsaid use. No hematochezia. Abdominal exam appears benign. Ct stone protocol without acute findings. Upper endoscopy normal. Hgb continues to drop but no emesis/hematochezia/melena since arrival - colonoscopy tomorrow - GI following - will d/c enteric precautions and c diff/pathogen panel order given lack of diarrhea since arrival  # Acute kidney injury Likely prerenal from decreased po. Baseline cr normal. Improving w/ fluids - IVF, monitor  # Anemia Likely subacute blood loss. Worsened to 8.2 this morning, asymtpomatic - trend  # Hyponatremia Likely 2/2 dehydration. Resolved w/ IVF.  # T2DM Here normoglycemic - metformin on hold - daily fasting glucose  # HTN Here normotensive in setting of above illness - hold home antihypertensives  # Chronic pain - cont home tramadol, gabapentin  # Acute cystitis Recent rx for pyelo at urgent care. Culture data not available. Here urine culture growing e coli, sensitivities pending - cont ceftriaxone - f/u culture  # MDD - home zoloft   DVT prophylaxis: SCDs Code Status: full Family Communication: son updated telephonically  1/7  Level of care: Med-Surg Status is: Inpatient  Remains inpatient appropriate because: severity of illness        Consultants:  GI  Procedures: Upper endoscopy 1/6  Antimicrobials:  ceftriaxone    Subjective: Tolerating diet, abd pain resolved, no n/v/d.  Objective: Vitals:   02/13/21 1701 02/13/21 2016 02/14/21 0301 02/14/21 0747  BP: 136/61 (!) 118/55 116/72 (!) 126/56  Pulse: 61 62 66 (!) 56  Resp: 16 16 16 18   Temp: (!) 97.5 F (36.4 C) 98.3 F (36.8 C) 98.4 F (36.9 C) 98.2 F (36.8 C)  TempSrc: Oral   Oral  SpO2: 100% 98% 100% 98%  Weight:      Height:        Intake/Output Summary (Last 24 hours) at 02/14/2021 1420 Last data filed at 02/13/2021 1925 Gross per 24 hour  Intake 498.58 ml  Output 0 ml  Net 498.58 ml   Filed Weights   02/13/21 1312  Weight: 62.6 kg    Examination:  General exam: Appears calm and comfortable  Respiratory system: Clear to auscultation. Respiratory effort normal. Cardiovascular system: S1 & S2 heard, RRR. No JVD, murmurs, rubs, gallops or clicks. No pedal edema. Gastrointestinal system: Abdomen is nondistended, soft and non-tender. No organomegaly or masses felt. Normal bowel sounds heard. Central nervous system: Alert and oriented. No focal neurological deficits. Extremities: Symmetric 5 x 5 power. Skin: No rashes, lesions or ulcers Psychiatry: Judgement and insight appear normal. Mood & affect appropriate.     Data Reviewed: I have personally reviewed following labs and imaging studies  CBC: Recent Labs  Lab 02/12/21 1639 02/14/21 0441  WBC 9.2 6.4  HGB 9.4* 8.2*  HCT 29.0* 25.3*  MCV 84.1 84.9  PLT 373  409   Basic Metabolic Panel: Recent Labs  Lab 02/12/21 1639 02/14/21 0441  NA 128* 136  K 4.1 4.1  CL 96* 110  CO2 25 23  GLUCOSE 88 82  BUN 19 11  CREATININE 1.47* 1.14*  CALCIUM 9.5 8.5*   GFR: Estimated Creatinine Clearance: 47.9 mL/min (A) (by C-G formula based on SCr of 1.14 mg/dL  (H)). Liver Function Tests: Recent Labs  Lab 02/12/21 1639  AST 24  ALT 16  ALKPHOS 88  BILITOT 0.5  PROT 9.1*  ALBUMIN 3.8   Recent Labs  Lab 02/12/21 1639  LIPASE 32   No results for input(s): AMMONIA in the last 168 hours. Coagulation Profile: Recent Labs  Lab 02/13/21 0210  INR 1.0   Cardiac Enzymes: No results for input(s): CKTOTAL, CKMB, CKMBINDEX, TROPONINI in the last 168 hours. BNP (last 3 results) No results for input(s): PROBNP in the last 8760 hours. HbA1C: Recent Labs    02/13/21 1041  HGBA1C 6.3*   CBG: Recent Labs  Lab 02/13/21 0359 02/13/21 0836 02/13/21 1311  GLUCAP 99 100* 86   Lipid Profile: No results for input(s): CHOL, HDL, LDLCALC, TRIG, CHOLHDL, LDLDIRECT in the last 72 hours. Thyroid Function Tests: No results for input(s): TSH, T4TOTAL, FREET4, T3FREE, THYROIDAB in the last 72 hours. Anemia Panel: Recent Labs    02/13/21 0306  VITAMINB12 524  FOLATE >100.0  FERRITIN 214  TIBC 318  IRON 41  RETICCTPCT 2.3   Urine analysis:    Component Value Date/Time   COLORURINE YELLOW (A) 02/12/2021 2229   APPEARANCEUR HAZY (A) 02/12/2021 2229   APPEARANCEUR Clear 11/28/2017 0843   LABSPEC 1.008 02/12/2021 2229   PHURINE 6.0 02/12/2021 2229   GLUCOSEU NEGATIVE 02/12/2021 2229   HGBUR NEGATIVE 02/12/2021 2229   BILIRUBINUR NEGATIVE 02/12/2021 2229   BILIRUBINUR Negative 11/28/2017 0843   KETONESUR NEGATIVE 02/12/2021 2229   PROTEINUR NEGATIVE 02/12/2021 2229   NITRITE NEGATIVE 02/12/2021 2229   LEUKOCYTESUR LARGE (A) 02/12/2021 2229   Sepsis Labs: @LABRCNTIP (procalcitonin:4,lacticidven:4)  ) Recent Results (from the past 240 hour(s))  Resp Panel by RT-PCR (Flu A&B, Covid) Nasopharyngeal Swab     Status: None   Collection Time: 02/12/21 10:29 PM   Specimen: Nasopharyngeal Swab; Nasopharyngeal(NP) swabs in vial transport medium  Result Value Ref Range Status   SARS Coronavirus 2 by RT PCR NEGATIVE NEGATIVE Final    Comment:  (NOTE) SARS-CoV-2 target nucleic acids are NOT DETECTED.  The SARS-CoV-2 RNA is generally detectable in upper respiratory specimens during the acute phase of infection. The lowest concentration of SARS-CoV-2 viral copies this assay can detect is 138 copies/mL. A negative result does not preclude SARS-Cov-2 infection and should not be used as the sole basis for treatment or other patient management decisions. A negative result may occur with  improper specimen collection/handling, submission of specimen other than nasopharyngeal swab, presence of viral mutation(s) within the areas targeted by this assay, and inadequate number of viral copies(<138 copies/mL). A negative result must be combined with clinical observations, patient history, and epidemiological information. The expected result is Negative.  Fact Sheet for Patients:  EntrepreneurPulse.com.au  Fact Sheet for Healthcare Providers:  IncredibleEmployment.be  This test is no t yet approved or cleared by the [place] FDA and  has been authorized for detection and/or diagnosis of SARS-CoV-2 by FDA under an Emergency Use Authorization (EUA). This EUA will remain  in effect (meaning this test can be used) for the duration of the COVID-19 declaration under Section 564(b)(1)  of the Act, 21 U.S.C.section 360bbb-3(b)(1), unless the authorization is terminated  or revoked sooner.       Influenza A by PCR NEGATIVE NEGATIVE Final   Influenza B by PCR NEGATIVE NEGATIVE Final    Comment: (NOTE) The Xpert Xpress SARS-CoV-2/FLU/RSV plus assay is intended as an aid in the diagnosis of influenza from Nasopharyngeal swab specimens and should not be used as a sole basis for treatment. Nasal washings and aspirates are unacceptable for Xpert Xpress SARS-CoV-2/FLU/RSV testing.  Fact Sheet for Patients: EntrepreneurPulse.com.au  Fact Sheet for Healthcare  Providers: IncredibleEmployment.be  This test is not yet approved or cleared by the [place] FDA and has been authorized for detection and/or diagnosis of SARS-CoV-2 by FDA under an Emergency Use Authorization (EUA). This EUA will remain in effect (meaning this test can be used) for the duration of the COVID-19 declaration under Section 564(b)(1) of the Act, 21 U.S.C. section 360bbb-3(b)(1), unless the authorization is terminated or revoked.  Performed at [facility], [address] [place], [place] [address]   Urine Culture     Status: Abnormal (Preliminary result)   Collection Time: 02/12/21 10:29 PM   Specimen: Urine, Random  Result Value Ref Range Status   Specimen Description   Final    URINE, RANDOM Performed at [facility], [address], [place], [place] [address]    Special Requests   Final    NONE Performed at [facility], [address] [place], [place] [address]    Culture (A)  Final    >=100,000 COLONIES/mL ESCHERICHIA COLI SUSCEPTIBILITIES TO FOLLOW Performed at [facility], [place] [address], [place], [place] [address]    Report Status PENDING  Incomplete         Radiology Studies: DG Chest 2 View  Result Date: 02/12/2021 CLINICAL DATA:  Weakness. EXAM: CHEST - 2 VIEW COMPARISON:  12/09/2020 FINDINGS: Heart size and vascularity normal. Lungs clear without infiltrate or effusion Generator pack in the right lateral chest unchanged. Lead extends into the cervical spinal canal unchanged. IMPRESSION: No active cardiopulmonary disease. Electronically Signed   By: Franchot Gallo M.D.   On: 02/12/2021 17:33   CT HEAD WO CONTRAST (5MM)  Result Date: 02/13/2021 CLINICAL DATA:  Weakness. EXAM: CT HEAD WITHOUT CONTRAST TECHNIQUE: Contiguous axial images were obtained from the base of the skull through the vertex without intravenous contrast. COMPARISON:  December 12, 2016 FINDINGS: Brain: No evidence of acute  infarction, hemorrhage, hydrocephalus, extra-axial collection or mass lesion/mass effect. Vascular: No hyperdense vessel or unexpected calcification. Skull: Normal. Negative for fracture or focal lesion. Sinuses/Orbits: No acute finding. Other: None. IMPRESSION: No acute intracranial pathology. Electronically Signed   By: Virgina Norfolk M.D.   On: 02/13/2021 02:35   CT Renal Stone Study  Result Date: 02/12/2021 CLINICAL DATA:  Flank pain EXAM: CT ABDOMEN AND PELVIS WITHOUT CONTRAST TECHNIQUE: Multidetector CT imaging of the abdomen and pelvis was performed following the standard protocol without IV contrast. COMPARISON:  CT chest 02/10/2021, and previous FINDINGS: Lower chest: Right flank subcutaneous implanted electronic device with catheter extending cephalad, incompletely visualized. No pleural or pericardial effusion. Visualized lung bases clear. Hepatobiliary: No focal liver abnormality is seen. Status post cholecystectomy. No biliary dilatation. Pancreas: Unremarkable. No pancreatic ductal dilatation or surrounding inflammatory changes. Spleen: Normal in size without focal abnormality. Adrenals/Urinary Tract: 1.4 cm left adrenal nodule, present on scans dating back to 04/09/2013, consistent with benign adenoma. No urolithiasis or hydronephrosis. Urinary bladder nondistended. Stomach/Bowel: Stomach is  partially distended, unremarkable. The small bowel is nondilated. Normal appendix. The colon is nondilated with probably a few scattered sigmoid diverticula, no adjacent inflammatory change or abscess. Vascular/Lymphatic: Mild aortoiliac calcified atheromatous plaque without aneurysm. No abdominal or pelvic adenopathy. Reproductive: Uterus and bilateral adnexa are unremarkable. Other: No ascites.  No free air. Musculoskeletal: No acute or significant osseous findings. IMPRESSION: 1. No acute findings. 2.  Aortic Atherosclerosis (ICD10-170.0). 3. Sigmoid diverticulosis. Electronically Signed   By: Lucrezia Europe  M.D.   On: 02/12/2021 18:26        Scheduled Meds:  pantoprazole  40 mg Oral Daily   polyethylene glycol-electrolytes  4,000 mL Oral Once   Continuous Infusions:  sodium chloride 125 mL/hr at 02/14/21 0254   sodium chloride     cefTRIAXone (ROCEPHIN)  IV 1 g (02/14/21 0305)     LOS: 1 day    Time spent: 57 min    Desma Maxim, MD [facility]   If 7PM-7AM, please contact night-coverage [email or web address] Password [email or web address] 02/14/2021, 2:20 PM

## 2021-02-14 NOTE — Progress Notes (Signed)
Jonathon Bellows , MD [address], [place], [place], [place], [address] [address], [place], [place], [place], [address] Phone: [phone]  Fax: [phone]   Alyssa Wood is being followed for anemia day 1 of follow up   Subjective: Denies any abdominal pain , nausea or vomting, only complaint has been chronic headaches. No blood in stool    Objective: Vital signs in last 24 hours: Vitals:   02/13/21 1701 02/13/21 2016 02/14/21 0301 02/14/21 0747  BP: 136/61 (!) 118/55 116/72 (!) 126/56  Pulse: 61 62 66 (!) 56  Resp: 16 16 16 18   Temp: (!) 97.5 F (36.4 C) 98.3 F (36.8 C) 98.4 F (36.9 C) 98.2 F (36.8 C)  TempSrc: Oral   Oral  SpO2: 100% 98% 100% 98%  Weight:      Height:       Weight change:   Intake/Output Summary (Last 24 hours) at 02/14/2021 1011 Last data filed at 02/13/2021 1925 Gross per 24 hour  Intake 1398.58 ml  Output 0 ml  Net 1398.58 ml     Exam: Heart:: Regular rate and rhythm, S1S2 present, or without murmur or extra heart sounds Lungs: normal and clear to auscultation Abdomen: soft, nontender, normal bowel sounds   Lab Results: @LABTEST2 @ Micro Results: Recent Results (from the past 240 hour(s))  Resp Panel by RT-PCR (Flu A&B, Covid) Nasopharyngeal Swab     Status: None   Collection Time: 02/12/21 10:29 PM   Specimen: Nasopharyngeal Swab; Nasopharyngeal(NP) swabs in vial transport medium  Result Value Ref Range Status   SARS Coronavirus 2 by RT PCR NEGATIVE NEGATIVE Final    Comment: (NOTE) SARS-CoV-2 target nucleic acids are NOT DETECTED.  The SARS-CoV-2 RNA is generally detectable in upper respiratory specimens during the acute phase of infection. The lowest concentration of SARS-CoV-2 viral copies this assay can detect is 138 copies/mL. A negative result does not preclude SARS-Cov-2 infection and should not be used as the sole basis for treatment or other patient management decisions. A negative result may occur with   improper specimen collection/handling, submission of specimen other than nasopharyngeal swab, presence of viral mutation(s) within the areas targeted by this assay, and inadequate number of viral copies(<138 copies/mL). A negative result must be combined with clinical observations, patient history, and epidemiological information. The expected result is Negative.  Fact Sheet for Patients:  EntrepreneurPulse.com.au  Fact Sheet for Healthcare Providers:  IncredibleEmployment.be  This test is no t yet approved or cleared by the [place] FDA and  has been authorized for detection and/or diagnosis of SARS-CoV-2 by FDA under an Emergency Use Authorization (EUA). This EUA will remain  in effect (meaning this test can be used) for the duration of the COVID-19 declaration under Section 564(b)(1) of the Act, 21 U.S.C.section 360bbb-3(b)(1), unless the authorization is terminated  or revoked sooner.       Influenza A by PCR NEGATIVE NEGATIVE Final   Influenza B by PCR NEGATIVE NEGATIVE Final    Comment: (NOTE) The Xpert Xpress SARS-CoV-2/FLU/RSV plus assay is intended as an aid in the diagnosis of influenza from Nasopharyngeal swab specimens and should not be used as a sole basis for treatment. Nasal washings and aspirates are unacceptable for Xpert Xpress SARS-CoV-2/FLU/RSV testing.  Fact Sheet for Patients: EntrepreneurPulse.com.au  Fact Sheet for Healthcare Providers: IncredibleEmployment.be  This test is not yet approved or cleared by the [place] FDA and has been authorized for detection and/or diagnosis of SARS-CoV-2 by FDA under an Emergency Use  Authorization (EUA). This EUA will remain in effect (meaning this test can be used) for the duration of the COVID-19 declaration under Section 564(b)(1) of the Act, 21 U.S.C. section 360bbb-3(b)(1), unless the authorization is terminated  or revoked.  Performed at [facility], [address] [place], [place] [address]   Urine Culture     Status: Abnormal (Preliminary result)   Collection Time: 02/12/21 10:29 PM   Specimen: Urine, Random  Result Value Ref Range Status   Specimen Description   Final    URINE, RANDOM Performed at [facility], [address] [place], [place] [address]    Special Requests   Final    NONE Performed at [facility], [address] [place], [place] [address]    Culture (A)  Final    >=100,000 COLONIES/mL Lonell Grandchild NEGATIVE RODS SUSCEPTIBILITIES TO FOLLOW Performed at [facility], [place] [address] [place], [place] [address]    Report Status PENDING  Incomplete   Studies/Results: DG Chest 2 View  Result Date: 02/12/2021 CLINICAL DATA:  Weakness. EXAM: CHEST - 2 VIEW COMPARISON:  12/09/2020 FINDINGS: Heart size and vascularity normal. Lungs clear without infiltrate or effusion Generator pack in the right lateral chest unchanged. Lead extends into the cervical spinal canal unchanged. IMPRESSION: No active cardiopulmonary disease. Electronically Signed   By: Franchot Gallo M.D.   On: 02/12/2021 17:33   CT HEAD WO CONTRAST (5MM)  Result Date: 02/13/2021 CLINICAL DATA:  Weakness. EXAM: CT HEAD WITHOUT CONTRAST TECHNIQUE: Contiguous axial images were obtained from the base of the skull through the vertex without intravenous contrast. COMPARISON:  December 12, 2016 FINDINGS: Brain: No evidence of acute infarction, hemorrhage, hydrocephalus, extra-axial collection or mass lesion/mass effect. Vascular: No hyperdense vessel or unexpected calcification. Skull: Normal. Negative for fracture or focal lesion. Sinuses/Orbits: No acute finding. Other: None. IMPRESSION: No acute intracranial pathology. Electronically Signed   By: Virgina Norfolk M.D.   On: 02/13/2021 02:35   CT Renal Stone Study  Result Date: 02/12/2021 CLINICAL DATA:  Flank pain EXAM: CT ABDOMEN AND PELVIS  WITHOUT CONTRAST TECHNIQUE: Multidetector CT imaging of the abdomen and pelvis was performed following the standard protocol without IV contrast. COMPARISON:  CT chest 02/10/2021, and previous FINDINGS: Lower chest: Right flank subcutaneous implanted electronic device with catheter extending cephalad, incompletely visualized. No pleural or pericardial effusion. Visualized lung bases clear. Hepatobiliary: No focal liver abnormality is seen. Status post cholecystectomy. No biliary dilatation. Pancreas: Unremarkable. No pancreatic ductal dilatation or surrounding inflammatory changes. Spleen: Normal in size without focal abnormality. Adrenals/Urinary Tract: 1.4 cm left adrenal nodule, present on scans dating back to 04/09/2013, consistent with benign adenoma. No urolithiasis or hydronephrosis. Urinary bladder nondistended. Stomach/Bowel: Stomach is partially distended, unremarkable. The small bowel is nondilated. Normal appendix. The colon is nondilated with probably a few scattered sigmoid diverticula, no adjacent inflammatory change or abscess. Vascular/Lymphatic: Mild aortoiliac calcified atheromatous plaque without aneurysm. No abdominal or pelvic adenopathy. Reproductive: Uterus and bilateral adnexa are unremarkable. Other: No ascites.  No free air. Musculoskeletal: No acute or significant osseous findings. IMPRESSION: 1. No acute findings. 2.  Aortic Atherosclerosis (ICD10-170.0). 3. Sigmoid diverticulosis. Electronically Signed   By: Lucrezia Europe M.D.   On: 02/12/2021 18:26   Medications: I have reviewed the patient's current medications. Scheduled Meds:  pantoprazole  40 mg Oral Daily   Continuous Infusions:  sodium chloride 125 mL/hr at 02/14/21 0938   cefTRIAXone (ROCEPHIN)  IV 1 g (02/14/21 0305)   PRN Meds:.acetaminophen **  OR** acetaminophen, morphine injection, ondansetron **OR** ondansetron (ZOFRAN) IV   Assessment: Principal Problem:   Acute gastroenteritis Active Problems:   Anxiety with  depression   Hyponatremia   AKI (acute kidney injury) ([place])   Anemia   Generalized weakness  Alyssa Wood 61 y.o. female admitted on 02/13/2021 with melena and drop of hemoglobin to 9.4 g and 12 with a pattern of NSAID use and H. pylori positive result in September 2022 status post treatment.  She was scheduled for outpatient evaluation with myself in November And she canceled her procedures due to her pneumonia and presented prior to t the rescheduled date.  Underwent EGD with Dr. Virgina Jock on 02/13/2018 which showed no gross abnormality hemoglobin was 9.4 g yesterday and dropped to 8.2 g today.No elevation in BUN/creatinine ratio.  Iron studies and B12 levels are normal.  Plan: Recommend inpatient colonoscopy tomorrow 2.  Monitor CBC and transfuse as needed  I have discussed alternative options, risks & benefits,  which include, but are not limited to, bleeding, infection, perforation,respiratory complication & drug reaction.  The patient agrees with this plan & written consent will be obtained.      LOS: 1 day   Jonathon Bellows, MD 02/14/2021, 10:11 AM

## 2021-02-15 ENCOUNTER — Inpatient Hospital Stay: Payer: Medicare HMO | Admitting: Certified Registered"

## 2021-02-15 ENCOUNTER — Encounter
Admission: EM | Disposition: A | Discharge status: Home / Self Care | Payer: Self-pay | Source: Home / Self Care | Attending: Obstetrics and Gynecology

## 2021-02-15 DIAGNOSIS — D126 Benign neoplasm of colon, unspecified: Secondary | ICD-10-CM

## 2021-02-15 DIAGNOSIS — K921 Melena: Secondary | ICD-10-CM | POA: Diagnosis not present

## 2021-02-15 HISTORY — PX: COLONOSCOPY WITH PROPOFOL: SHX5780

## 2021-02-15 LAB — CBC
HCT: 29 % — ABNORMAL LOW (ref 36.0–46.0)
Hemoglobin: 9.4 g/dL — ABNORMAL LOW (ref 12.0–15.0)
MCH: 27.5 pg (ref 26.0–34.0)
MCHC: 32.4 g/dL (ref 30.0–36.0)
MCV: 84.8 fL (ref 80.0–100.0)
Platelets: 310 10*3/uL (ref 150–400)
RBC: 3.42 MIL/uL — ABNORMAL LOW (ref 3.87–5.11)
RDW: 13.5 % (ref 11.5–15.5)
WBC: 7 10*3/uL (ref 4.0–10.5)
nRBC: 0 % (ref 0.0–0.2)

## 2021-02-15 LAB — URINE CULTURE: Culture: >=100000 — AB

## 2021-02-15 LAB — BASIC METABOLIC PANEL
Anion gap: 7 (ref 5–15)
BUN: 7 mg/dL — ABNORMAL LOW (ref 8–23)
CO2: 24 mmol/L (ref 22–32)
Calcium: 9.2 mg/dL (ref 8.9–10.3)
Chloride: 110 mmol/L (ref 98–111)
Creatinine, Ser: 1.08 mg/dL — ABNORMAL HIGH (ref 0.44–1.00)
GFR, Estimated: 58 mL/min — ABNORMAL LOW (ref >60)
Glucose, Bld: 91 mg/dL (ref 70–99)
Potassium: 3.8 mmol/L (ref 3.5–5.1)
Sodium: 141 mmol/L (ref 135–145)

## 2021-02-15 SURGERY — COLONOSCOPY WITH PROPOFOL
Anesthesia: Monitor Anesthesia Care

## 2021-02-15 MED ORDER — CEFDINIR 300 MG PO CAPS: 300.0000 mg | ORAL_CAPSULE | Freq: Two times a day (BID) | ORAL | 0 refills | Status: DC

## 2021-02-15 MED ORDER — PROPOFOL 10 MG/ML IV BOLUS
INTRAVENOUS | Status: DC | PRN
Start: 2021-02-15 — End: 2021-02-15
  Administered 2021-02-15: 20 mg via INTRAVENOUS
  Administered 2021-02-15 (×2): 50 mg via INTRAVENOUS
  Administered 2021-02-15: 20 mg via INTRAVENOUS
  Administered 2021-02-15 (×2): 30 mg via INTRAVENOUS

## 2021-02-15 MED ORDER — LIDOCAINE HCL (CARDIAC) PF 100 MG/5ML IV SOSY
PREFILLED_SYRINGE | INTRAVENOUS | Status: DC | PRN
  Administered 2021-02-15: 100 mg via INTRAVENOUS

## 2021-02-15 NOTE — TOC CM/SW Note (Signed)
°  Transition of Care [facility]) Screening Note   Patient Details  Name: Alyssa Wood Date of Birth: 1960/10/07   Transition of Care [facility]) CM/SW Contact:    Magnus Ivan, LCSW Phone Number: 02/15/2021, 9:12 AM    Transition of Care Department [facility]) has reviewed patient and no TOC needs have been identified at this time. We will continue to monitor patient advancement through interdisciplinary progression rounds. If new patient transition needs arise, please place a TOC consult.

## 2021-02-15 NOTE — Anesthesia Preprocedure Evaluation (Addendum)
Anesthesia Evaluation  Patient identified by MRN, date of birth, ID band Patient awake    Reviewed: Allergy & Precautions, NPO status , Patient's Chart, lab work & pertinent test results  History of Anesthesia Complications Negative for: history of anesthetic complications  Airway Mallampati: II  TM Distance: >3 FB Neck ROM: Full    Dental  (+) Edentulous Lower, Edentulous Upper   Pulmonary neg pulmonary ROS,    Pulmonary exam normal breath sounds clear to auscultation       Cardiovascular Exercise Tolerance: Good METS: 3 - Mets hypertension, Pt. on medications negative cardio ROS Normal cardiovascular exam Rhythm:Regular Rate:Normal     Neuro/Psych  Headaches, PSYCHIATRIC DISORDERS Anxiety Depression  Neuromuscular disease ([place])    GI/Hepatic Neg liver ROS, GERD  Medicated and Controlled,  Endo/Other  negative endocrine ROSdiabetes, Well Controlled, Type 2  Renal/GU Renal InsufficiencyRenal disease (ARI - prerenasl d/t low PO intake)  negative genitourinary   Musculoskeletal  (+) Arthritis , Osteoarthritis,    Abdominal   Peds  Hematology  (+) anemia , Presented with melena; EGD 2 days ago,   Anesthesia Other Findings   Reproductive/Obstetrics negative OB ROS                             Anesthesia Physical Anesthesia Plan  ASA: 3  Anesthesia Plan: MAC   Post-op Pain Management:    Induction: Intravenous  PONV Risk Score and Plan: 2 and Ondansetron, TIVA and Propofol infusion  Airway Management Planned: Natural Airway and Nasal Cannula  Additional Equipment:   Intra-op Plan:   Post-operative Plan:   Informed Consent: I have reviewed the patients History and Physical, chart, labs and discussed the procedure including the risks, benefits and alternatives for the proposed anesthesia with the patient or authorized representative who has indicated his/her understanding and  acceptance.     Dental Advisory Given  Plan Discussed with: Anesthesiologist, CRNA and Surgeon  Anesthesia Plan Comments: (Patient consented for risks of anesthesia including but not limited to:  - adverse reactions to medications - damage to eyes, teeth, lips or other oral mucosa - nerve damage due to positioning  - sore throat or hoarseness - Damage to heart, brain, nerves, lungs, other parts of body or loss of life  Patient voiced understanding.)        Anesthesia Quick Evaluation

## 2021-02-15 NOTE — Transfer of Care (Signed)
Immediate Anesthesia Transfer of Care Note  Patient: Presli Dominguez-Vasquez  Procedure(s) Performed: COLONOSCOPY WITH PROPOFOL  Patient Location: PACU  Anesthesia Type:MAC  Level of Consciousness: awake, alert  and oriented  Airway & Oxygen Therapy: Patient Spontanous Breathing and Patient connected to nasal cannula oxygen  Post-op Assessment: Report given to RN and Post -op Vital signs reviewed and stable  Post vital signs: Reviewed and stable  Last Vitals:  Vitals Value Taken Time  BP    Temp    Pulse 68 02/15/21 1145  Resp 14 02/15/21 1145  SpO2 100 % 02/15/21 1145  Vitals shown include unvalidated device data.  Last Pain:  Vitals:   02/15/21 0752  TempSrc: Oral  PainSc:       Patients Stated Pain Goal: 0 (86/76/19 5093)  Complications: No notable events documented.

## 2021-02-15 NOTE — Op Note (Signed)
[facility] Patient Name: Alyssa Wood Procedure Date: 02/15/2021 11:11 AM MRN: [id number] Account #: [id number] Date of Birth: Jan 09, 1961 Admit Type: Inpatient Age: 61 Room: [facility] ENDO ROOM 4 Gender: Female Note Status: Finalized Instrument Name: Jasper Riling [id number] Procedure:             Colonoscopy Indications:           Melena Providers:             Jonathon Bellows MD, MD Referring MD:          Bridget Hartshorn j. [facility], dr (Referring MD) Medicines:             Monitored Anesthesia Care Complications:         No immediate complications. Procedure:             Pre-Anesthesia Assessment:                        - Prior to the procedure, a History and Physical was                         performed, and patient medications, allergies and                         sensitivities were reviewed. The patient's tolerance                         of previous anesthesia was reviewed.                        - The risks and benefits of the procedure and the                         sedation options and risks were discussed with the                         patient. All questions were answered and informed                         consent was obtained.                        - ASA Grade Assessment: II - A patient with mild                         systemic disease.                        After obtaining informed consent, the colonoscope was                         passed under direct vision. Throughout the procedure,                         the patient's blood pressure, pulse, and oxygen                         saturations were monitored continuously. The                         Colonoscope was introduced through  the anus and                         advanced to the the cecum, identified by the                         appendiceal orifice. The colonoscopy was performed                         with ease. The patient tolerated the procedure well.                          The quality of the bowel preparation was good. Findings:      The perianal and digital rectal examinations were normal.      A 5 mm polyp was found in the cecum. The polyp was sessile. The polyp       was removed with a cold snare. Resection was complete, but the polyp       tissue was not retrieved.      Two sessile polyps were found in the descending colon. The polyps were 5       to 7 mm in size. These polyps were removed with a cold snare. Resection       and retrieval were complete.      The exam was otherwise without abnormality on direct and retroflexion       views. Impression:            - One 5 mm polyp in the cecum, removed with a cold                         snare. Complete resection. Polyp tissue not retrieved.                        - Two 5 to 7 mm polyps in the descending colon,                         removed with a cold snare. Resected and retrieved.                        - The examination was otherwise normal on direct and                         retroflexion views. Recommendation:        - Return patient to hospital ward for ongoing care.                        - Advance diet as tolerated.                        - Continue present medications.                        - Await pathology results.                        - Repeat colonoscopy in 3 years for surveillance.                        - Suggest capsule srtudy of the  small bwoel as                         outpatient Procedure Code(s):     --- Professional ---                        [phone], Colonoscopy, flexible; with removal of                         tumor(s), polyp(s), or other lesion(s) by snare                         technique Diagnosis Code(s):     --- Professional ---                        K63.5, Polyp of colon                        K92.1, Melena (includes Hematochezia) CPT copyright 2019 American Medical Association. All rights reserved. The codes documented in this report are preliminary and  upon coder review may  be revised to meet current compliance requirements. Jonathon Bellows, MD Jonathon Bellows MD, MD 02/15/2021 11:41:27 AM This report has been signed electronically. Number of Addenda: 0 Note Initiated On: 02/15/2021 11:11 AM Scope Withdrawal Time: 0 hours 9 minutes 11 seconds  Total Procedure Duration: 0 hours 12 minutes 5 seconds  Estimated Blood Loss:  Estimated blood loss: none.      [facility]

## 2021-02-15 NOTE — H&P (Signed)
Jonathon Bellows, MD [address], [place], [place], [place], [address] [id number] [place], [place], [place], [address] Phone: [phone]  Fax: [phone]  Primary Care Physician:  [facility]   Pre-Procedure History & Physical: HPI:  Alyssa Wood is a 61 y.o. female is here for an colonoscopy.   Past Medical History:  Diagnosis Date   Anxiety    Chronic pain    Depression    Diabetes mellitus without complication (HCC)    GERD (gastroesophageal reflux disease)    Hypertension     Past Surgical History:  Procedure Laterality Date   CHOLECYSTECTOMY  1992   COLONOSCOPY WITH PROPOFOL N/A 08/28/2015   Procedure: COLONOSCOPY WITH PROPOFOL;  Surgeon: [name], MD;  Location: [facility];  Service: Endoscopy;  Laterality: N/A;  PER PT PLEASE LEAVE LAST NEEDS INTERPRETER   POLYPECTOMY  08/28/2015   Procedure: POLYPECTOMY;  Surgeon: [name], MD;  Location: [facility];  Service: Endoscopy;;   SHOULDER SURGERY Right    SPINAL CORD STIMULATOR IMPLANT Right Jan 2013   WRIST SURGERY Right    right hand and wrist reconstruction    Prior to Admission medications   Medication Sig Start Date End Date Taking? Authorizing Provider  Calcium Carbonate-Vitamin D 600-400 MG-UNIT tablet Take 1 tablet by mouth 2 (two) times daily.    Yes [provider]  Cyanocobalamin (VITAMIN B 12 PO) Take 1 tablet by mouth daily.   Yes [provider]  lisinopril-hydrochlorothiazide (PRINZIDE,ZESTORETIC) 20-12.5 MG tablet Take 1 tablet by mouth daily.    Yes [provider]  meloxicam (MOBIC) 15 MG tablet Take 1 tablet (15 mg total) by mouth daily. 04/01/20 02/13/21 Yes Milinda Pointer, MD  metFORMIN (GLUCOPHAGE) 500 MG tablet Take 500 mg by mouth 2 (two) times daily with a meal.   Yes [provider]  omeprazole (PRILOSEC) 20 MG capsule Take 2 capsules (40 mg total) by mouth daily. 10/27/20  Yes Jonathon Bellows,  MD  rosuvastatin (CRESTOR) 10 MG tablet Take 10 mg by mouth at bedtime. 07/02/20  Yes [provider]  sertraline (ZOLOFT) 50 MG tablet Take 50 mg by mouth daily. 02/11/21  Yes [provider]  SYMBICORT 160-4.5 MCG/ACT inhaler Inhale 2 puffs into the lungs 2 (two) times daily. 12/16/20  Yes [provider]  amLODipine (NORVASC) 2.5 MG tablet Take 1 tablet (2.5 mg total) by mouth daily. Patient not taking: Reported on 02/13/2021 08/17/19 10/27/20  Duffy Bruce, MD  clobetasol ointment (TEMOVATE) 0.05 % Apply twice daily to rash on hands when present. Patient not taking: Reported on 02/13/2021 04/02/19   [provider]  gabapentin (NEURONTIN) 300 MG capsule Take 1 capsule (300 mg total) by mouth at bedtime. Follow written titration schedule Patient not taking: Reported on 02/13/2021 04/01/20 10/27/20  Milinda Pointer, MD  hydrOXYzine (ATARAX/VISTARIL) 25 MG tablet Take 1 tablet (25 mg total) by mouth every 8 (eight) hours as needed for anxiety. Patient not taking: Reported on 02/13/2021 08/17/19   Duffy Bruce, MD  omega-3 acid ethyl esters (LOVAZA) 1 g capsule Take by mouth 2 (two) times daily. Patient not taking: Reported on 02/13/2021    [provider]  Sod Picosulfate-Mag Ox-Cit Acd (CLENPIQ) 10-3.5-12 MG-GM -GM/160ML SOLN Take 1 bottle at 5 PM followed by five 8 oz cups of water and repeat 5 hours before procedure. Patient not taking: Reported on 02/13/2021 10/27/20   Jonathon Bellows, MD  traMADol Veatrice Bourbon) 50 MG tablet Take  1 tablet (50 mg total) by mouth 2 (two) times daily. Patient not taking: Reported on 02/13/2021 04/01/20 10/27/20  Milinda Pointer, MD    Allergies as of 02/12/2021 - Review Complete 02/12/2021  Allergen Reaction Noted   Trazodone and nefazodone Anaphylaxis 08/19/2015   Hydrocodone Nausea And Vomiting 08/19/2015    Family History  Problem Relation Age of Onset   Cancer Father    Heart disease Father    Breast cancer Neg Hx     Social  History   Socioeconomic History   Marital status: Married    Spouse name: Not on file   Number of children: Not on file   Years of education: Not on file   Highest education level: Not on file  Occupational History   Not on file  Tobacco Use   Smoking status: Never   Smokeless tobacco: Never  Vaping Use   Vaping Use: Never used  Substance and Sexual Activity   Alcohol use: No   Drug use: No   Sexual activity: Not Currently  Other Topics Concern   Not on file  Social History Narrative   Not on file   Social Determinants of Health   Financial Resource Strain: Not on file  Food Insecurity: Not on file  Transportation Needs: Not on file  Physical Activity: Not on file  Stress: Not on file  Social Connections: Not on file  Intimate Partner Violence: Not on file    Review of Systems: See HPI, otherwise negative ROS  Physical Exam: BP (!) 128/54 (BP Location: Left Arm)    Pulse (!) 51    Temp 98 F (36.7 C) (Oral)    Resp 18    Ht 5\' 6"  (1.676 m)    Wt 62.6 kg    SpO2 96%    BMI 22.27 kg/m  General:   Alert,  pleasant and cooperative in NAD Head:  Normocephalic and atraumatic. Neck:  Supple; no masses or thyromegaly. Lungs:  Clear throughout to auscultation, normal respiratory effort.    Heart:  +S1, +S2, Regular rate and rhythm, No edema. Abdomen:  Soft, nontender and nondistended. Normal bowel sounds, without guarding, and without rebound.   Neurologic:  Alert and  oriented x4;  grossly normal neurologically.  Impression/Plan: Alyssa Wood is here for an colonoscopy to be performed for anemia Risks, benefits, limitations, and alternatives regarding  colonoscopy have been reviewed with the patient.  Questions have been answered.  All parties agreeable.   Jonathon Bellows, MD  02/15/2021, 11:23 AM

## 2021-02-15 NOTE — Discharge Summary (Signed)
Alyssa Wood JFH:[id number] DOB: [birth year] DOA: 02/13/2021  PCP: [facility] [place] date: 02/13/2021 Discharge date: 02/15/2021  Time spent: 35 minutes  Recommendations for Outpatient Follow-up:  GI f/u for capsule endoscopy PCP f/u CBC 1 week  F/u pathology reports    Discharge Diagnoses:  Principal Problem:   Acute gastroenteritis Active Problems:   Anxiety with depression   Hyponatremia   AKI (acute kidney injury) ([place])   Anemia   Generalized weakness   Discharge Condition: stable  Diet recommendation: light diet to start  [facility] 2 Weights   02/13/21 1312  Weight: 62.6 kg    History of present illness:  Alyssa Wood is a 61 y.o. female with medical history significant for Anxiety and depression, DM, HTN, chronic pain, history of H. pylori gastritis with positive breath test September 2022, and chronic NSAID use, as well as chronic abdominal pain and chronic diarrhea followed by gastroenterology, with recent history of UTI/pyelonephritis treated with Levaquin from 12/11-12/21 who presents to the ED due to persistent weakness, not improving following her treatment for UTI.  She also has continued epigastric pain, continued diarrhea which are black, and continued nausea with some vomiting.  She has had poor oral intake due to her symptoms and feels weak to where it is difficult to walk.  She saw her PCP earlier in the day with BP 109/69 and heart rate of 92 and sent her to the ED for further evaluation    Hospital Course:  # Nausea/vomiting/diarrhea # Epigastric pain Initial concern for gastric pathology given epigastric pain, recent h pylori, chronic nsaid use. No hematochezia but appears to be gI bleed as dark stools and new anemia. Abdominal exam appears benign. Ct stone protocol without acute findings. Upper endoscopy normal. Colonoscopy with polyps, otherwise normal. Hgb stable. Abdominal pain and nausea/vomiting/diarrhea resolved  spontaneously. No diarrhea here so no stool studies performed - f/u path - f/u GI, advising capsule endoscopy   # Acute kidney injury 2/2 dehydration, resolved w/ fluids   # Anemia Likely subacute blood loss. stable in 8s - cbc @ f/u   # Hyponatremia Resolved w/ IVF    # Acute cystitis Recent rx for pyelo at urgent care. Culture data not available. Here urine culture growing e coli. Treated w/ ceftriaxone - d/c w/ cefdinir    Procedures: Upper endoscopy, colonoscopy   Consultations: GI  Discharge Exam: Vitals:   02/15/21 1150 02/15/21 1200  BP: (!) 131/56 (!) 142/54  Pulse: (!) 58 (!) 52  Resp: 18 18  Temp:    SpO2: 100% 99%    General exam: Appears calm and comfortable  Respiratory system: Clear to auscultation. Respiratory effort normal. Cardiovascular system: S1 & S2 heard, RRR. No JVD, murmurs, rubs, gallops or clicks. No pedal edema. Gastrointestinal system: Abdomen is nondistended, soft and non-tender. No organomegaly or masses felt. Normal bowel sounds heard. Central nervous system: Alert and oriented. No focal neurological deficits. Extremities: Symmetric 5 x 5 power. Skin: No rashes, lesions or ulcers Psychiatry: Judgement and insight appear normal. Mood & affect appropriate.   Discharge Instructions   Discharge Instructions     Diet Carb Modified   Complete by: As directed    Increase activity slowly   Complete by: As directed       Allergies as of 02/15/2021       Reactions   Trazodone And Nefazodone Anaphylaxis   Hydrocodone Nausea And Vomiting        Medication List  STOP taking these medications    amLODipine 2.5 MG tablet Commonly known as: NORVASC   Clenpiq 10-3.5-12 MG-GM -GM/160ML Soln Generic drug: Sod Picosulfate-Mag Ox-Cit Acd   clobetasol ointment 0.05 % Commonly known as: TEMOVATE   hydrOXYzine 25 MG tablet Commonly known as: ATARAX   meloxicam 15 MG tablet Commonly known as: MOBIC   omega-3 acid ethyl esters  1 g capsule Commonly known as: LOVAZA   traMADol 50 MG tablet Commonly known as: ULTRAM       TAKE these medications    Calcium Carbonate-Vitamin D 600-400 MG-UNIT tablet Take 1 tablet by mouth 2 (two) times daily.   cefdinir 300 MG capsule Commonly known as: OMNICEF Take 1 capsule (300 mg total) by mouth 2 (two) times daily.   gabapentin 300 MG capsule Commonly known as: NEURONTIN Take 1 capsule (300 mg total) by mouth at bedtime. Follow written titration schedule   lisinopril-hydrochlorothiazide 20-12.5 MG tablet Commonly known as: ZESTORETIC Take 1 tablet by mouth daily.   metFORMIN 500 MG tablet Commonly known as: GLUCOPHAGE Take 500 mg by mouth 2 (two) times daily with a meal.   omeprazole 20 MG capsule Commonly known as: PRILOSEC Take 2 capsules (40 mg total) by mouth daily.   rosuvastatin 10 MG tablet Commonly known as: CRESTOR Take 10 mg by mouth at bedtime.   sertraline 50 MG tablet Commonly known as: ZOLOFT Take 50 mg by mouth daily.   Symbicort 160-4.5 MCG/ACT inhaler Generic drug: budesonide-formoterol Inhale 2 puffs into the lungs 2 (two) times daily.   VITAMIN B 12 PO Take 1 tablet by mouth daily.       Allergies  Allergen Reactions   Trazodone And Nefazodone Anaphylaxis   Hydrocodone Nausea And Vomiting    Follow-up Information     [facility] [place] Follow up.   Specialty: General Practice Contact information: [place] [address] [phone]         Jonathon Bellows, MD. Schedule an appointment as soon as possible for a visit.   Specialty: Gastroenterology Contact information: [address] [place] [address] [phone]                  The results of significant diagnostics from this hospitalization (including imaging, microbiology, ancillary and laboratory) are listed below for reference.    Significant Diagnostic Studies: DG Chest 2  View  Result Date: 02/12/2021 CLINICAL DATA:  Weakness. EXAM: CHEST - 2 VIEW COMPARISON:  12/09/2020 FINDINGS: Heart size and vascularity normal. Lungs clear without infiltrate or effusion Generator pack in the right lateral chest unchanged. Lead extends into the cervical spinal canal unchanged. IMPRESSION: No active cardiopulmonary disease. Electronically Signed   By: Franchot Gallo M.D.   On: 02/12/2021 17:33   CT HEAD WO CONTRAST (5MM)  Result Date: 02/13/2021 CLINICAL DATA:  Weakness. EXAM: CT HEAD WITHOUT CONTRAST TECHNIQUE: Contiguous axial images were obtained from the base of the skull through the vertex without intravenous contrast. COMPARISON:  December 12, 2016 FINDINGS: Brain: No evidence of acute infarction, hemorrhage, hydrocephalus, extra-axial collection or mass lesion/mass effect. Vascular: No hyperdense vessel or unexpected calcification. Skull: Normal. Negative for fracture or focal lesion. Sinuses/Orbits: No acute finding. Other: None. IMPRESSION: No acute intracranial pathology. Electronically Signed   By: Virgina Norfolk M.D.   On: 02/13/2021 02:35   CT CHEST WO CONTRAST  Result Date: 02/10/2021 CLINICAL DATA:  Intermittent chest discomfort a productive cough over the last 6 months. EXAM:  CT CHEST WITHOUT CONTRAST TECHNIQUE: Multidetector CT imaging of the chest was performed following the standard protocol without IV contrast. COMPARISON:  Radiography 12/09/2020.  CT 04/09/2013 FINDINGS: Cardiovascular: Heart size is normal. No pericardial fluid. No visible coronary artery calcification. Aortic atherosclerotic calcification at the arch. Mediastinum/Nodes: No mass or lymphadenopathy. Lungs/Pleura: No underlying emphysema. Minimal pleural and parenchymal scarring at the apices. Minimal indistinct hazy opacity in the posterior right upper lobe that could be scarring or a minimal infiltrate. The lungs are otherwise clear. No consolidation, collapse or effusion. Airways appear normal by CT.  No evidence of definite bronchial thickening. No bronchiectasis Upper Abdomen: Previous cholecystectomy.  Otherwise negative. Musculoskeletal: Spinal neurostimulator in place without visible complicating feature. IMPRESSION: Negative study with the exception of a small area indistinct hazy opacity in the posterior right upper lobe that could be scarring or a very minimal infectious infiltrate. No evidence of dense consolidation or collapse in that region or elsewhere. No pleural effusion. No evidence of airway disease. Aortic Atherosclerosis (ICD10-I70.0). Electronically Signed   By: Nelson Chimes M.D.   On: 02/10/2021 12:03   CT Renal Stone Study  Result Date: 02/12/2021 CLINICAL DATA:  Flank pain EXAM: CT ABDOMEN AND PELVIS WITHOUT CONTRAST TECHNIQUE: Multidetector CT imaging of the abdomen and pelvis was performed following the standard protocol without IV contrast. COMPARISON:  CT chest 02/10/2021, and previous FINDINGS: Lower chest: Right flank subcutaneous implanted electronic device with catheter extending cephalad, incompletely visualized. No pleural or pericardial effusion. Visualized lung bases clear. Hepatobiliary: No focal liver abnormality is seen. Status post cholecystectomy. No biliary dilatation. Pancreas: Unremarkable. No pancreatic ductal dilatation or surrounding inflammatory changes. Spleen: Normal in size without focal abnormality. Adrenals/Urinary Tract: 1.4 cm left adrenal nodule, present on scans dating back to 04/09/2013, consistent with benign adenoma. No urolithiasis or hydronephrosis. Urinary bladder nondistended. Stomach/Bowel: Stomach is partially distended, unremarkable. The small bowel is nondilated. Normal appendix. The colon is nondilated with probably a few scattered sigmoid diverticula, no adjacent inflammatory change or abscess. Vascular/Lymphatic: Mild aortoiliac calcified atheromatous plaque without aneurysm. No abdominal or pelvic adenopathy. Reproductive: Uterus and  bilateral adnexa are unremarkable. Other: No ascites.  No free air. Musculoskeletal: No acute or significant osseous findings. IMPRESSION: 1. No acute findings. 2.  Aortic Atherosclerosis (ICD10-170.0). 3. Sigmoid diverticulosis. Electronically Signed   By: Lucrezia Europe M.D.   On: 02/12/2021 18:26    Microbiology: Recent Results (from the past 240 hour(s))  Resp Panel by RT-PCR (Flu A&B, Covid) Nasopharyngeal Swab     Status: None   Collection Time: 02/12/21 10:29 PM   Specimen: Nasopharyngeal Swab; Nasopharyngeal(NP) swabs in vial transport medium  Result Value Ref Range Status   SARS Coronavirus 2 by RT PCR NEGATIVE NEGATIVE Final    Comment: (NOTE) SARS-CoV-2 target nucleic acids are NOT DETECTED.  The SARS-CoV-2 RNA is generally detectable in upper respiratory specimens during the acute phase of infection. The lowest concentration of SARS-CoV-2 viral copies this assay can detect is 138 copies/mL. A negative result does not preclude SARS-Cov-2 infection and should not be used as the sole basis for treatment or other patient management decisions. A negative result may occur with  improper specimen collection/handling, submission of specimen other than nasopharyngeal swab, presence of viral mutation(s) within the areas targeted by this assay, and inadequate number of viral copies(<138 copies/mL). A negative result must be combined with clinical observations, patient history, and epidemiological information. The expected result is Negative.  Fact Sheet for Patients:  EntrepreneurPulse.com.au  Fact Sheet for  Healthcare Providers:  IncredibleEmployment.be  This test is no t yet approved or cleared by the [place] and  has been authorized for detection and/or diagnosis of SARS-CoV-2 by FDA under an Emergency Use Authorization (EUA). This EUA will remain  in effect (meaning this test can be used) for the duration of the COVID-19 declaration  under Section 564(b)(1) of the Act, 21 U.S.C.section 360bbb-3(b)(1), unless the authorization is terminated  or revoked sooner.       Influenza A by PCR NEGATIVE NEGATIVE Final   Influenza B by PCR NEGATIVE NEGATIVE Final    Comment: (NOTE) The Xpert Xpress SARS-CoV-2/FLU/RSV plus assay is intended as an aid in the diagnosis of influenza from Nasopharyngeal swab specimens and should not be used as a sole basis for treatment. Nasal washings and aspirates are unacceptable for Xpert Xpress SARS-CoV-2/FLU/RSV testing.  Fact Sheet for Patients: EntrepreneurPulse.com.au  Fact Sheet for Healthcare Providers: IncredibleEmployment.be  This test is not yet approved or cleared by the [place] FDA and has been authorized for detection and/or diagnosis of SARS-CoV-2 by FDA under an Emergency Use Authorization (EUA). This EUA will remain in effect (meaning this test can be used) for the duration of the COVID-19 declaration under Section 564(b)(1) of the Act, 21 U.S.C. section 360bbb-3(b)(1), unless the authorization is terminated or revoked.  Performed at [facility], [address] [place], [place] [address]   Urine Culture     Status: Abnormal   Collection Time: 02/12/21 10:29 PM   Specimen: Urine, Random  Result Value Ref Range Status   Specimen Description   Final    URINE, RANDOM Performed at [facility], [address] [place], [place] [address]    Special Requests   Final    NONE Performed at [facility], [address] [place], [place] [address]    Culture >=100,000 COLONIES/mL ESCHERICHIA COLI (A)  Final   Report Status 02/15/2021 FINAL  Final   Organism ID, Bacteria ESCHERICHIA COLI (A)  Final      Susceptibility   Escherichia coli - MIC*    AMPICILLIN <=2 SENSITIVE Sensitive     CEFAZOLIN <=4 SENSITIVE Sensitive     CEFEPIME <=0.12 SENSITIVE Sensitive     CEFTRIAXONE <=0.25 SENSITIVE Sensitive      CIPROFLOXACIN >=4 RESISTANT Resistant     GENTAMICIN <=1 SENSITIVE Sensitive     IMIPENEM <=0.25 SENSITIVE Sensitive     NITROFURANTOIN <=16 SENSITIVE Sensitive     TRIMETH/SULFA >=320 RESISTANT Resistant     AMPICILLIN/SULBACTAM <=2 SENSITIVE Sensitive     PIP/TAZO <=4 SENSITIVE Sensitive     * >=100,000 COLONIES/mL ESCHERICHIA COLI     Labs: Basic Metabolic Panel: Recent Labs  Lab 02/12/21 1639 02/14/21 0441 02/15/21 0626  NA 128* 136 141  K 4.1 4.1 3.8  CL 96* 110 110  CO2 25 23 24   GLUCOSE 88 82 91  BUN 19 11 7*  CREATININE 1.47* 1.14* 1.08*  CALCIUM 9.5 8.5* 9.2   Liver Function Tests: Recent Labs  Lab 02/12/21 1639  AST 24  ALT 16  ALKPHOS 88  BILITOT 0.5  PROT 9.1*  ALBUMIN 3.8   Recent Labs  Lab 02/12/21 1639  LIPASE 32   No results for input(s): AMMONIA in the last 168 hours. CBC: Recent Labs  Lab 02/12/21 1639 02/14/21 0441 02/15/21 0626  WBC 9.2 6.4 7.0  HGB 9.4* 8.2* 9.4*  HCT 29.0* 25.3* 29.0*  MCV 84.1 84.9 84.8  PLT 373 299 310  Cardiac Enzymes: No results for input(s): CKTOTAL, CKMB, CKMBINDEX, TROPONINI in the last 168 hours. BNP: BNP (last 3 results) No results for input(s): BNP in the last 8760 hours.  ProBNP (last 3 results) No results for input(s): PROBNP in the last 8760 hours.  CBG: Recent Labs  Lab 02/13/21 0359 02/13/21 0836 02/13/21 1311  GLUCAP 99 100* 86       Signed:  Desma Maxim MD.  [facility] 02/15/2021, 1:41 PM

## 2021-02-15 NOTE — Plan of Care (Signed)
  Problem: Education: Goal: Knowledge of General Education information will improve Description: Including pain rating scale, medication(s)/side effects and non-pharmacologic comfort measures Outcome: Progressing   Problem: Clinical Measurements: Goal: Ability to maintain clinical measurements within normal limits will improve Outcome: Progressing   

## 2021-02-15 NOTE — Anesthesia Postprocedure Evaluation (Signed)
Anesthesia Post Note  Patient: Alyssa Wood  Procedure(s) Performed: COLONOSCOPY WITH PROPOFOL  Anesthesia Type: MAC Anesthetic complications: no   No notable events documented.   Last Vitals:  Vitals:   02/15/21 1200 02/15/21 1548  BP: (!) 142/54 140/67  Pulse: (!) 52 80  Resp: 18 18  Temp:  37 C  SpO2: 99% 98%    Last Pain:  Vitals:   02/15/21 1548  TempSrc: Oral  PainSc:                  Tonny Bollman

## 2021-02-16 ENCOUNTER — Encounter: Payer: Self-pay | Admitting: Gastroenterology

## 2021-02-16 ENCOUNTER — Encounter: Admission: RE | Payer: Self-pay | Source: Home / Self Care

## 2021-02-16 ENCOUNTER — Ambulatory Visit: Admission: RE | Admit: 2021-02-16 | Payer: Medicare HMO | Source: Home / Self Care | Admitting: Gastroenterology

## 2021-02-16 SURGERY — COLONOSCOPY WITH PROPOFOL
Anesthesia: General

## 2021-02-17 LAB — SURGICAL PATHOLOGY

## 2021-02-24 ENCOUNTER — Ambulatory Visit: Payer: Medicare HMO | Admitting: Gastroenterology

## 2021-02-24 ENCOUNTER — Encounter: Payer: Self-pay | Admitting: Gastroenterology

## 2021-02-24 ENCOUNTER — Other Ambulatory Visit: Payer: Self-pay

## 2021-02-24 VITALS — BP 138/67 | HR 80 | Temp 99.0°F | Wt 144.2 lb

## 2021-02-24 DIAGNOSIS — Z8719 Personal history of other diseases of the digestive system: Secondary | ICD-10-CM

## 2021-02-24 DIAGNOSIS — A048 Other specified bacterial intestinal infections: Secondary | ICD-10-CM | POA: Diagnosis not present

## 2021-02-24 DIAGNOSIS — R1013 Epigastric pain: Secondary | ICD-10-CM | POA: Diagnosis not present

## 2021-02-24 DIAGNOSIS — R194 Change in bowel habit: Secondary | ICD-10-CM | POA: Diagnosis not present

## 2021-02-24 DIAGNOSIS — G8929 Other chronic pain: Secondary | ICD-10-CM

## 2021-02-24 NOTE — Patient Instructions (Addendum)
Preparacin de la cpsula endoscpica:  1. Deber comprar en su farmacia:  Un frasco de gotas de simeticona (antigas)  2 cuartos de Gatorade con 254 gramos de [place] lax mezclados en lquido 2. Suspenda todos los suplementos de hierro 5 das antes de su estudio. 3. Sin [place] o Anticidos 24 HORAS antes del estudio de la cpsula. 4. Diabticos: deben tomar la mitad de su dosis habitual de medicamentos para la diabetes el da del examen. (Consulte a su mdico si es necesario).  1 DA ANTES DEL EXAMEN: Puede tomar un desayuno ligero. NO ALIMENTOS SLIDOS despus del desayuno. Beba solo lquidos claros a partir de las 12:00 p. m. (medioda-hora del almuerzo). Los lquidos claros permitidos son Gatorade o PowerAde, agua, jugos de frutas claros, refrescos claros, consom o caldo de pollo, gelatina natural sin fruta, paletas heladas, caf solo o t. SIN LECHE, CREMA O PRODUCTOS LCTEOS, SIN COLORANTES/TINTES ROJOS O MORADOS 5. Tome una (1) botella de citrato de magnesio de 10 oz. entre las 4:00 y las 6:00 [place] BEBER DESPUS DE LA MEDIA NOCHE  MAANA DEL EXAMEN: A las 6:00 a. m., mezcle la mitad de la botella de gotas de simeticona (antigas) con 8 oz. vaso de agua y beba esta solucin. Puede tomar su Corazn, Presin arterial, Convulsiones, Respiracin al [employer] antes de la ingestin de la cpsula con un sorbo de [place].  FECHA DE LA CITA: 01/23/20223 en [facility] **[place] [phone] EL DA ANTES DEL PROCEDIMIENTO PARA [place].

## 2021-02-24 NOTE — Progress Notes (Signed)
Jonathon Bellows MD, MRCP(U.K) [address]  [place]  [place], [place] [address]  Main: [phone]  Fax: [phone]   Primary Care Physician: [facility]  Primary Gastroenterologist:  Dr. Jonathon Bellows   C.c : Abdominal pain follow up   HPI: Alyssa Wood is a 61 y.o. female   Summary of history :  Initially referred and seen on 10/27/2020 for abdominal pain .She has been having abdominal pain since March April of this year.  Gradually getting worse.  Occurs every time she eats within 15 minutes.  Last for a few hours.  Points to the upper part of her abdomen and radiates to the sides.  Sharp in nature.  Associated nausea and vomiting at times.  She has been taking meloxicam almost daily for many months for shoulder pain.   She has softer than usual stools.  Up to 4 times a day.  No blood in the stool.  Recalls her last colonoscopy was 5 years back and had polyps at that time.  Not on any PPI.   08/16/2019: Hemoglobin 11.9 g, MCV 81 09/26/2020: Ultrasound abdomen complete status postcholecystectomy otherwise negative examination  Interval history   10/27/2020-02/25/2020  Admitted to hospital 02/2021 with melena   10/27/2020: H pylori positive , treated with clarithromycin based triple therapy. 02/13/2021: B12 , folate, iron studies normal 02/22/2021: Ct renal stone protocol : Sigmoid diverticulosis 02/13/2021: No obvious cause seen for melena, diverticulosis, small colon polyps seen- adenomas.Gastritis   Since her hospital discharge she says she does not have any black-colored stools but occasionally has darker stools.  Some fatigue but much better than what it was when she presented to the hospital.  She has stopped taking meloxicam.  States he takes 20 mg omeprazole in the morning and 20 at night at times.  Still has occasional burning sensation after she eats in the epigastric area.  Entire visit was conducted via [place] video interpreter.  Denies any  diarrhea  Current Outpatient Medications  Medication Sig Dispense Refill   Calcium Carbonate-Vitamin D 600-400 MG-UNIT tablet Take 1 tablet by mouth 2 (two) times daily.      cefdinir (OMNICEF) 300 MG capsule Take 1 capsule (300 mg total) by mouth 2 (two) times daily. 10 capsule 0   Cyanocobalamin (VITAMIN B 12 PO) Take 1 tablet by mouth daily.     gabapentin (NEURONTIN) 300 MG capsule Take 1 capsule (300 mg total) by mouth at bedtime. Follow written titration schedule 90 capsule 0   lisinopril-hydrochlorothiazide (PRINZIDE,ZESTORETIC) 20-12.5 MG tablet Take 1 tablet by mouth daily.      metFORMIN (GLUCOPHAGE) 500 MG tablet Take 500 mg by mouth 2 (two) times daily with a meal.     omeprazole (PRILOSEC) 20 MG capsule Take 2 capsules (40 mg total) by mouth daily. 30 capsule 1   rosuvastatin (CRESTOR) 10 MG tablet Take 10 mg by mouth at bedtime.     sertraline (ZOLOFT) 50 MG tablet Take 50 mg by mouth daily.     SYMBICORT 160-4.5 MCG/ACT inhaler Inhale 2 puffs into the lungs 2 (two) times daily.     No current facility-administered medications for this visit.    Allergies as of 02/24/2021 - Review Complete 02/24/2021  Allergen Reaction Noted   Trazodone and nefazodone Anaphylaxis 08/19/2015   Hydrocodone Nausea And Vomiting 08/19/2015    ROS:  General: Negative for anorexia, weight loss, fever, chills, fatigue, weakness. ENT: Negative for hoarseness, difficulty swallowing , nasal congestion. CV: Negative  for chest pain, angina, palpitations, dyspnea on exertion, peripheral edema.  Respiratory: Negative for dyspnea at rest, dyspnea on exertion, cough, sputum, wheezing.  GI: See history of present illness. GU:  Negative for dysuria, hematuria, urinary incontinence, urinary frequency, nocturnal urination.  Endo: Negative for unusual weight change.    Physical Examination:   BP 138/67    Pulse 80    Temp 99 F (37.2 C) (Oral)    Wt 144 lb 3.2 oz (65.4 kg)    BMI 23.27 kg/m   General:  Well-nourished, well-developed in no acute distress.  Eyes: No icterus. Conjunctivae pink. Mouth: Oropharyngeal mucosa moist and pink , no lesions erythema or exudate. Lungs: Clear to auscultation bilaterally. Non-labored. Heart: Regular rate and rhythm, no murmurs rubs or gallops.  Abdomen: Bowel sounds are normal, nontender, nondistended, no hepatosplenomegaly or masses, no abdominal bruits or hernia , no rebound or guarding.   Extremities: No lower extremity edema. No clubbing or deformities. Neuro: Alert and oriented x 3.  Grossly intact. Skin: Warm and dry, no jaundice.   Psych: Alert and cooperative, normal mood and affect.   Imaging Studies: DG Chest 2 View  Result Date: 02/12/2021 CLINICAL DATA:  Weakness. EXAM: CHEST - 2 VIEW COMPARISON:  12/09/2020 FINDINGS: Heart size and vascularity normal. Lungs clear without infiltrate or effusion Generator pack in the right lateral chest unchanged. Lead extends into the cervical spinal canal unchanged. IMPRESSION: No active cardiopulmonary disease. Electronically Signed   By: Franchot Gallo M.D.   On: 02/12/2021 17:33   CT HEAD WO CONTRAST (5MM)  Result Date: 02/13/2021 CLINICAL DATA:  Weakness. EXAM: CT HEAD WITHOUT CONTRAST TECHNIQUE: Contiguous axial images were obtained from the base of the skull through the vertex without intravenous contrast. COMPARISON:  December 12, 2016 FINDINGS: Brain: No evidence of acute infarction, hemorrhage, hydrocephalus, extra-axial collection or mass lesion/mass effect. Vascular: No hyperdense vessel or unexpected calcification. Skull: Normal. Negative for fracture or focal lesion. Sinuses/Orbits: No acute finding. Other: None. IMPRESSION: No acute intracranial pathology. Electronically Signed   By: Virgina Norfolk M.D.   On: 02/13/2021 02:35   CT CHEST WO CONTRAST  Result Date: 02/10/2021 CLINICAL DATA:  Intermittent chest discomfort a productive cough over the last 6 months. EXAM: CT CHEST WITHOUT CONTRAST  TECHNIQUE: Multidetector CT imaging of the chest was performed following the standard protocol without IV contrast. COMPARISON:  Radiography 12/09/2020.  CT 04/09/2013 FINDINGS: Cardiovascular: Heart size is normal. No pericardial fluid. No visible coronary artery calcification. Aortic atherosclerotic calcification at the arch. Mediastinum/Nodes: No mass or lymphadenopathy. Lungs/Pleura: No underlying emphysema. Minimal pleural and parenchymal scarring at the apices. Minimal indistinct hazy opacity in the posterior right upper lobe that could be scarring or a minimal infiltrate. The lungs are otherwise clear. No consolidation, collapse or effusion. Airways appear normal by CT. No evidence of definite bronchial thickening. No bronchiectasis Upper Abdomen: Previous cholecystectomy.  Otherwise negative. Musculoskeletal: Spinal neurostimulator in place without visible complicating feature. IMPRESSION: Negative study with the exception of a small area indistinct hazy opacity in the posterior right upper lobe that could be scarring or a very minimal infectious infiltrate. No evidence of dense consolidation or collapse in that region or elsewhere. No pleural effusion. No evidence of airway disease. Aortic Atherosclerosis (ICD10-I70.0). Electronically Signed   By: Nelson Chimes M.D.   On: 02/10/2021 12:03   CT Renal Stone Study  Result Date: 02/12/2021 CLINICAL DATA:  Flank pain EXAM: CT ABDOMEN AND PELVIS WITHOUT CONTRAST TECHNIQUE: Multidetector CT imaging of the  abdomen and pelvis was performed following the standard protocol without IV contrast. COMPARISON:  CT chest 02/10/2021, and previous FINDINGS: Lower chest: Right flank subcutaneous implanted electronic device with catheter extending cephalad, incompletely visualized. No pleural or pericardial effusion. Visualized lung bases clear. Hepatobiliary: No focal liver abnormality is seen. Status post cholecystectomy. No biliary dilatation. Pancreas: Unremarkable. No  pancreatic ductal dilatation or surrounding inflammatory changes. Spleen: Normal in size without focal abnormality. Adrenals/Urinary Tract: 1.4 cm left adrenal nodule, present on scans dating back to 04/09/2013, consistent with benign adenoma. No urolithiasis or hydronephrosis. Urinary bladder nondistended. Stomach/Bowel: Stomach is partially distended, unremarkable. The small bowel is nondilated. Normal appendix. The colon is nondilated with probably a few scattered sigmoid diverticula, no adjacent inflammatory change or abscess. Vascular/Lymphatic: Mild aortoiliac calcified atheromatous plaque without aneurysm. No abdominal or pelvic adenopathy. Reproductive: Uterus and bilateral adnexa are unremarkable. Other: No ascites.  No free air. Musculoskeletal: No acute or significant osseous findings. IMPRESSION: 1. No acute findings. 2.  Aortic Atherosclerosis (ICD10-170.0). 3. Sigmoid diverticulosis. Electronically Signed   By: Lucrezia Europe M.D.   On: 02/12/2021 18:26    Assessment and Plan:   Alyssa Wood is a 61 y.o. y/o female here to follow up for chronic abdominal pain since February this year.  Long-term use of meloxicam.   H/o H. pylori infection status posttreatment    Plan  No NSAID use Continue PPI increase dose to 40 mg Prilosec twice daily Capsule study of small bowel  4.   CBC   Risks, benefits, alternatives of Givens capsule discussed with patient to include but not limited to the rare risk of Given's capsule becoming lodged in the GI tract requiring surgical removal.  The patient agrees with this plan & consent will be obtained.    Dr Jonathon Bellows  MD,MRCP [facility]) Follow up in 8 weeks

## 2021-02-25 ENCOUNTER — Encounter: Payer: Self-pay | Admitting: Gastroenterology

## 2021-02-25 ENCOUNTER — Telehealth: Payer: Self-pay

## 2021-02-25 LAB — CBC WITH DIFFERENTIAL/PLATELET
Basophils Absolute: 0.1 10*3/uL (ref 0.0–0.2)
Basos: 1 %
EOS (ABSOLUTE): 0.2 10*3/uL (ref 0.0–0.4)
Eos: 2 %
Hematocrit: 30.3 % — ABNORMAL LOW (ref 34.0–46.6)
Hemoglobin: 9.6 g/dL — ABNORMAL LOW (ref 11.1–15.9)
Immature Grans (Abs): 0 10*3/uL (ref 0.0–0.1)
Immature Granulocytes: 0 %
Lymphocytes Absolute: 3.3 10*3/uL — ABNORMAL HIGH (ref 0.7–3.1)
Lymphs: 43 %
MCH: 26.7 pg (ref 26.6–33.0)
MCHC: 31.7 g/dL (ref 31.5–35.7)
MCV: 84 fL (ref 79–97)
Monocytes Absolute: 0.6 10*3/uL (ref 0.1–0.9)
Monocytes: 7 %
Neutrophils Absolute: 3.6 10*3/uL (ref 1.4–7.0)
Neutrophils: 47 %
Platelets: 261 10*3/uL (ref 150–450)
RBC: 3.59 x10E6/uL — ABNORMAL LOW (ref 3.77–5.28)
RDW: 14.5 % (ref 11.7–15.4)
WBC: 7.8 10*3/uL (ref 3.4–10.8)

## 2021-02-25 NOTE — Progress Notes (Signed)
3 years

## 2021-02-25 NOTE — Telephone Encounter (Signed)
Called from refferal to schedule appointment patient already had some appointments scheduled

## 2021-02-26 LAB — H. PYLORI BREATH TEST: H pylori Breath Test: NEGATIVE

## 2021-02-27 ENCOUNTER — Telehealth: Payer: Self-pay

## 2021-02-27 DIAGNOSIS — Z8719 Personal history of other diseases of the digestive system: Secondary | ICD-10-CM

## 2021-02-27 NOTE — Telephone Encounter (Signed)
-----   Message from Jonathon Bellows, MD sent at 02/25/2021 10:31 AM EST ----- Hb stable at 9.6 - repeat in 6 weeks please

## 2021-02-27 NOTE — Telephone Encounter (Signed)
Called patient to let her know the below information and she understood and had no further questions.Patient was also told to come to the office in 6 weeks for [place] to draw her labs again to make sure that she is still doing well. Patient agreed.

## 2021-03-02 ENCOUNTER — Ambulatory Visit
Admission: RE | Admit: 2021-03-02 | Discharge: 2021-03-02 | Disposition: A | Discharge status: Home / Self Care | Payer: Medicare HMO | Attending: Gastroenterology | Admitting: Gastroenterology

## 2021-03-02 ENCOUNTER — Encounter
Admission: RE | Disposition: A | Discharge status: Home / Self Care | Payer: Self-pay | Source: Home / Self Care | Attending: Gastroenterology

## 2021-03-02 ENCOUNTER — Encounter: Payer: Self-pay | Admitting: Certified Registered"

## 2021-03-02 DIAGNOSIS — R109 Unspecified abdominal pain: Secondary | ICD-10-CM | POA: Diagnosis present

## 2021-03-02 HISTORY — PX: GIVENS CAPSULE STUDY: SHX5432

## 2021-03-02 SURGERY — IMAGING PROCEDURE, GI TRACT, INTRALUMINAL, VIA CAPSULE

## 2021-03-02 NOTE — Progress Notes (Signed)
Patient requested a work note for the daughter that transported her to the appointment . A note was given.

## 2021-03-03 ENCOUNTER — Encounter: Payer: Self-pay | Admitting: Gastroenterology

## 2021-03-09 ENCOUNTER — Encounter: Payer: Self-pay | Admitting: Gastroenterology

## 2021-04-29 ENCOUNTER — Ambulatory Visit: Payer: Medicare HMO | Admitting: Gastroenterology

## 2021-08-06 ENCOUNTER — Encounter: Payer: Self-pay | Admitting: Gastroenterology

## 2021-08-06 ENCOUNTER — Ambulatory Visit: Payer: Medicare HMO | Admitting: Gastroenterology

## 2021-08-06 VITALS — BP 131/86 | HR 81 | Temp 98.9°F | Wt 156.4 lb

## 2021-08-06 DIAGNOSIS — G8929 Other chronic pain: Secondary | ICD-10-CM

## 2021-08-06 DIAGNOSIS — R1013 Epigastric pain: Secondary | ICD-10-CM

## 2021-08-06 DIAGNOSIS — A048 Other specified bacterial intestinal infections: Secondary | ICD-10-CM | POA: Diagnosis not present

## 2021-08-06 DIAGNOSIS — Z8719 Personal history of other diseases of the digestive system: Secondary | ICD-10-CM

## 2021-08-06 NOTE — Progress Notes (Signed)
Jonathon Bellows MD, MRCP(U.K) [address]  [place], [place] [address]  Main: [phone]  Fax: [phone]   Primary Care Physician: [facility]  Primary Gastroenterologist:  Dr. Jonathon Bellows   Chief Complaint  Patient presents with   Abdominal Pain    HPI: Alyssa Wood is a 61 y.o. female Summary of history :   Initially referred and seen on 10/27/2020 for abdominal pain .She has been having abdominal pain since March April of  2022  Gradually getting worse.  Occurs every time she eats within 15 minutes.  Last for a few hours.  Points to the upper part of her abdomen and radiates to the sides.  Sharp in nature.  Associated nausea and vomiting at times.  She has been taking meloxicam almost daily for many months for shoulder pain.   She has softer than usual stools.  Up to 4 times a day.  No blood in the stool.  Recalls her last colonoscopy was 5 years back and had polyps at that time.  Not on any PPI.   08/16/2019: Hemoglobin 11.9 g, MCV 81 09/26/2020: Ultrasound abdomen complete status postcholecystectomy otherwise negative examination Admitted to hospital 02/2021 with melena    10/27/2020: H pylori positive , treated with clarithromycin based triple therapy. 02/13/2021: B12 , folate, iron studies normal 02/22/2021: Ct renal stone protocol : Sigmoid diverticulosis 02/13/2021: No obvious cause seen for melena, diverticulosis, small colon polyps seen- adenomas.Gastritis     Interval history  02/25/2020-08/06/2021   03/13/2021: Capsule study of small bowel: Normal study 02/24/2021 H. pylori breath test negative, hemoglobin 9.6 g   Does not speak English entire visit conducted via [place] interpreter.  Since last visit she is doing well takes omeprazole 40 mg twice a day with that has no GI symptoms but if she misses a dose has bloating abdominal pain heartburn and discomfort.  She has not tried taking it once a day.  Denies any NSAID use uses  Tylenol for pale.  Denies any black stools.  No other complaints.    Current Outpatient Medications  Medication Sig Dispense Refill   Calcium Carbonate-Vitamin D 600-400 MG-UNIT tablet Take 1 tablet by mouth 2 (two) times daily.      cefdinir (OMNICEF) 300 MG capsule Take 1 capsule (300 mg total) by mouth 2 (two) times daily. 10 capsule 0   Cyanocobalamin (VITAMIN B 12 PO) Take 1 tablet by mouth daily.     gabapentin (NEURONTIN) 300 MG capsule Take 1 capsule (300 mg total) by mouth at bedtime. Follow written titration schedule 90 capsule 0   lisinopril-hydrochlorothiazide (PRINZIDE,ZESTORETIC) 20-12.5 MG tablet Take 1 tablet by mouth daily.      metFORMIN (GLUCOPHAGE) 500 MG tablet Take 500 mg by mouth 2 (two) times daily with a meal.     omeprazole (PRILOSEC) 20 MG capsule Take 2 capsules (40 mg total) by mouth daily. (Patient taking differently: Take 40 mg by mouth 2 (two) times daily before a meal.) 30 capsule 1   rosuvastatin (CRESTOR) 10 MG tablet Take 10 mg by mouth at bedtime.     sertraline (ZOLOFT) 50 MG tablet Take 50 mg by mouth daily.     SYMBICORT 160-4.5 MCG/ACT inhaler Inhale 2 puffs into the lungs 2 (two) times daily.     No current facility-administered medications for this visit.    Allergies as of 08/06/2021 - Review Complete 08/06/2021  Allergen Reaction Noted   Trazodone and nefazodone Anaphylaxis 08/19/2015  Hydrocodone Nausea And Vomiting 08/19/2015    ROS:  General: Negative for anorexia, weight loss, fever, chills, fatigue, weakness. ENT: Negative for hoarseness, difficulty swallowing , nasal congestion. CV: Negative for chest pain, angina, palpitations, dyspnea on exertion, peripheral edema.  Respiratory: Negative for dyspnea at rest, dyspnea on exertion, cough, sputum, wheezing.  GI: See history of present illness. GU:  Negative for dysuria, hematuria, urinary incontinence, urinary frequency, nocturnal urination.  Endo: Negative for unusual weight change.     Physical Examination:   BP 131/86   Pulse 81   Temp 98.9 F (37.2 C) (Oral)   Wt 156 lb 6.4 oz (70.9 kg)   BMI 25.24 kg/m   General: Well-nourished, well-developed in no acute distress.  Eyes: No icterus. Conjunctivae pink. Mouth: Oropharyngeal mucosa moist and pink , no lesions erythema or exudate. Neuro: Alert and oriented x 3.  Grossly intact. Skin: Warm and dry, no jaundice.   Psych: Alert and cooperative, normal mood and affect.   Imaging Studies: No results found.  Assessment and Plan:   Alyssa Wood is a 61 y.o. y/o female here to follow up for chronic abdominal pain since February 2022.  Long-term use of meloxicam.   H/o H. pylori infection status posttreatment.  Admitted to the hospital with melena in 2022 evaluation was negative, no clear cause noted likely due to NSAID use.  Capsule study of the small bowel was also negative.  She was anemic but stable at that time.     Plan  No NSAID use Check CBC Off PPIs she has significant symptoms but had to go back to taking PPI twice a day.  She has not recently tried going on to omeprazole 40 mg once a day we will try it for a week if she has recurrence of symptoms will go back to twice a day and give her a prescription for long-term use.  If she can tolerate 40 mg once a day then we will try to stay at the dose for a while and when I see her back in 8 months we will try to further reduce the dose.  I am a bit concerned if she still has persistent H. pylori infection and the prior test may be a false negative test we will recheck her for H. pylori today   Dr Jonathon Bellows  MD,MRCP [facility]) Follow up in 65-month

## 2021-08-07 ENCOUNTER — Telehealth: Payer: Self-pay

## 2021-08-07 DIAGNOSIS — Z8719 Personal history of other diseases of the digestive system: Secondary | ICD-10-CM

## 2021-08-07 LAB — CBC WITH DIFFERENTIAL/PLATELET
Basophils Absolute: 0.1 10*3/uL (ref 0.0–0.2)
Basos: 1 %
EOS (ABSOLUTE): 0.1 10*3/uL (ref 0.0–0.4)
Eos: 2 %
Hematocrit: 33.4 % — ABNORMAL LOW (ref 34.0–46.6)
Hemoglobin: 11 g/dL — ABNORMAL LOW (ref 11.1–15.9)
Immature Grans (Abs): 0 10*3/uL (ref 0.0–0.1)
Immature Granulocytes: 0 %
Lymphocytes Absolute: 2.8 10*3/uL (ref 0.7–3.1)
Lymphs: 38 %
MCH: 27.4 pg (ref 26.6–33.0)
MCHC: 32.9 g/dL (ref 31.5–35.7)
MCV: 83 fL (ref 79–97)
Monocytes Absolute: 0.6 10*3/uL (ref 0.1–0.9)
Monocytes: 8 %
Neutrophils Absolute: 3.8 10*3/uL (ref 1.4–7.0)
Neutrophils: 51 %
Platelets: 255 10*3/uL (ref 150–450)
RBC: 4.01 x10E6/uL (ref 3.77–5.28)
RDW: 13.4 % (ref 11.7–15.4)
WBC: 7.5 10*3/uL (ref 3.4–10.8)

## 2021-08-07 LAB — H. PYLORI BREATH TEST: H pylori Breath Test: NEGATIVE

## 2021-08-07 NOTE — Telephone Encounter (Signed)
-----   Message from Jonathon Bellows, MD sent at 08/07/2021  8:10 AM EDT ----- Hb has improved - recheck in 3-4 months please inform and place order

## 2021-08-07 NOTE — Telephone Encounter (Signed)
Called patient but had to leave her a detailed message letting her know that her hemoglobin was better and that Dr. Vicente Males would like to recheck it in 3-4 months to make sure that it was stable to better. I told her that I would place her order and that she did not need to schedule an appointment.

## 2021-08-07 NOTE — Progress Notes (Signed)
Hb has improved - recheck in 3-4 months please inform and place order

## 2021-09-23 ENCOUNTER — Encounter: Payer: Self-pay | Admitting: Emergency Medicine

## 2021-09-23 ENCOUNTER — Emergency Department
Admission: EM | Admit: 2021-09-23 | Discharge: 2021-09-24 | Disposition: A | Discharge status: Home / Self Care | Payer: Medicare HMO | Attending: Emergency Medicine | Admitting: Emergency Medicine

## 2021-09-23 DIAGNOSIS — H5712 Ocular pain, left eye: Secondary | ICD-10-CM | POA: Diagnosis present

## 2021-09-23 DIAGNOSIS — H01004 Unspecified blepharitis left upper eyelid: Secondary | ICD-10-CM | POA: Insufficient documentation

## 2021-09-23 DIAGNOSIS — H1032 Unspecified acute conjunctivitis, left eye: Secondary | ICD-10-CM | POA: Diagnosis not present

## 2021-09-23 MED ORDER — ERYTHROMYCIN 5 MG/GM OP OINT: 1.0000 | TOPICAL_OINTMENT | Freq: Every day | OPHTHALMIC | 0 refills | Status: DC

## 2021-09-23 NOTE — ED Triage Notes (Signed)
Pt c/o left eye pain, itching, redness and swelling x2 days. Pt denies injury and denies eye drainage. On assessment, pts left eye lid is red and swollen but eye open.

## 2021-09-23 NOTE — Discharge Instructions (Addendum)
You may alternate Tylenol 1000 mg every 6 hours as needed for pain, fever and Ibuprofen 800 mg every 6-8 hours as needed for pain, fever.  Please take Ibuprofen with food.  Do not take more than 4000 mg of Tylenol (acetaminophen) in a 24 hour period.  I recommend warm compresses to your left eye several times a day.  Please apply the antibiotic ointment directly to the lid margin at bedtime for 2 weeks.  If your symptoms or not improving, I recommend you continue to follow-up with your primary care doctor.

## 2021-09-23 NOTE — ED Provider Notes (Cosign Needed Addendum)
[facility] Provider Note  Patient Contact: 10:50 PM (approximate)   History   Eye Pain   HPI  Alyssa Wood is a 62 y.o. female who presents the emergency department complaining of left eye irritation, drainage.  Patient states that a couple days ago she started having some eye irritation with some purulent drainage.  She has also noticed now that she is having some swelling and redness of the upper eyelid.  No facial pain.  She denies any contact with known pinkeye.  Patient denies any other symptoms at this time.     Physical Exam   Triage Vital Signs: ED Triage Vitals [09/23/21 2114]  Enc Vitals Group     BP 103/80     Pulse Rate 82     Resp 18     Temp 98.6 F (37 C)     Temp Source Oral     SpO2 98 %     Weight      Height      Head Circumference      Peak Flow      Pain Score      Pain Loc      Pain Edu?      Excl. in [place]?     Most recent vital signs: Vitals:   09/23/21 2114  BP: 103/80  Pulse: 82  Resp: 18  Temp: 98.6 F (37 C)  SpO2: 98%     General: Alert and in no acute distress. Eyes:  PERRL. EOMI. visualization of the left conjunctival reveals some irritation with some purulent drainage on the upper and lower eyelashes.  Patient does have findings consistent with a stye to the left upper eyelid.  No evidence of preseptal or orbital cellulitis.  Cardiovascular:  Good peripheral perfusion Respiratory: Normal respiratory effort without tachypnea or retractions. Lungs CTAB.  Musculoskeletal: Full range of motion to all extremities.  Neurologic:  No gross focal neurologic deficits are appreciated.  Skin:   No rash noted Other:   ED Results / Procedures / Treatments   Labs (all labs ordered are listed, but only abnormal results are displayed) Labs Reviewed  COMPREHENSIVE METABOLIC PANEL  CBC WITH DIFFERENTIAL/PLATELET     EKG     RADIOLOGY    No results found.  PROCEDURES:  Critical Care  performed: No  Procedures   MEDICATIONS ORDERED IN ED: Medications - No data to display   IMPRESSION / MDM / [place] / ED COURSE  I reviewed the triage vital signs and the nursing notes.                              Differential diagnosis includes, but is not limited to, conjunctivitis (viral, allergic, bacterial, chemical) preseptal cellulitis, blepharitis, hordeolum, orbital cellulitis   Patient's presentation is most consistent with acute presentation with potential threat to life or bodily function.   Patient arrived to the ED with left upper eyelid edema, purulent drainage from the left eye.  Patient states that she was sent from her primary care provider for CT scan for orbital cellulitis.  Patient has full range of motion to the bilateral eyes.  She does have some slight edema of the left upper eyelid.  I suspect more that this is likely blepharitis however as patient is reporting pain, swelling to the area and was sent from primary care she request CT and labs at this time.  Feel this is reasonable  and will order same.  Patient care will be transferred to attending provider, Dr. Leonides Schanz at shift change for final diagnosis and disposition.  If there is no evidence of orbital cellulitis I recommend antibiotics and discharge.       FINAL CLINICAL IMPRESSION(S) / ED DIAGNOSES   Final diagnoses:  Acute bacterial conjunctivitis of left eye  Blepharitis of left upper eyelid, unspecified type     Rx / DC Orders   ED Discharge Orders          Ordered    erythromycin ophthalmic ointment  Daily at bedtime        09/23/21 2311             Note:  This document was prepared using Dragon voice recognition software and may include unintentional dictation errors.   Darletta Moll, PA-C 09/23/21 2311    CuthriellCharline Bills, PA-C 09/23/21 2347    Carrie Mew, MD 09/24/21 [phone]

## 2021-09-24 ENCOUNTER — Emergency Department: Payer: Medicare HMO

## 2021-09-24 LAB — CBC WITH DIFFERENTIAL/PLATELET
Abs Immature Granulocytes: 0.01 10*3/uL (ref 0.00–0.07)
Basophils Absolute: 0.1 10*3/uL (ref 0.0–0.1)
Basophils Relative: 1 %
Eosinophils Absolute: 0.2 10*3/uL (ref 0.0–0.5)
Eosinophils Relative: 2 %
HCT: 34.8 % — ABNORMAL LOW (ref 36.0–46.0)
Hemoglobin: 11.2 g/dL — ABNORMAL LOW (ref 12.0–15.0)
Immature Granulocytes: 0 %
Lymphocytes Relative: 42 %
Lymphs Abs: 3.1 10*3/uL (ref 0.7–4.0)
MCH: 27.3 pg (ref 26.0–34.0)
MCHC: 32.2 g/dL (ref 30.0–36.0)
MCV: 84.7 fL (ref 80.0–100.0)
Monocytes Absolute: 0.8 10*3/uL (ref 0.1–1.0)
Monocytes Relative: 11 %
Neutro Abs: 3.3 10*3/uL (ref 1.7–7.7)
Neutrophils Relative %: 44 %
Platelets: 234 10*3/uL (ref 150–400)
RBC: 4.11 MIL/uL (ref 3.87–5.11)
RDW: 13.2 % (ref 11.5–15.5)
WBC: 7.5 10*3/uL (ref 4.0–10.5)
nRBC: 0 % (ref 0.0–0.2)

## 2021-09-24 LAB — COMPREHENSIVE METABOLIC PANEL
ALT: 24 U/L (ref 0–44)
AST: 32 U/L (ref 15–41)
Albumin: 4.1 g/dL (ref 3.5–5.0)
Alkaline Phosphatase: 116 U/L (ref 38–126)
Anion gap: 7 (ref 5–15)
BUN: 18 mg/dL (ref 8–23)
CO2: 27 mmol/L (ref 22–32)
Calcium: 9.2 mg/dL (ref 8.9–10.3)
Chloride: 100 mmol/L (ref 98–111)
Creatinine, Ser: 0.92 mg/dL (ref 0.44–1.00)
GFR, Estimated: >60 mL/min (ref >60)
Glucose, Bld: 98 mg/dL (ref 70–99)
Potassium: 4.4 mmol/L (ref 3.5–5.1)
Sodium: 134 mmol/L — ABNORMAL LOW (ref 135–145)
Total Bilirubin: 0.7 mg/dL (ref 0.3–1.2)
Total Protein: 8.1 g/dL (ref 6.5–8.1)

## 2021-09-24 MED ORDER — IOHEXOL 300 MG/ML  SOLN
75.0000 mL | Freq: Once | INTRAMUSCULAR | Status: AC | PRN
Start: 2021-09-24 — End: 2021-09-24
  Administered 2021-09-24: 75 mL via INTRAVENOUS

## 2021-09-24 MED ORDER — ACETAMINOPHEN 500 MG PO TABS
1000.0000 mg | ORAL_TABLET | Freq: Once | ORAL | Status: AC
  Administered 2021-09-24: 1000 mg via ORAL
  Filled 2021-09-24: qty 2

## 2021-09-24 NOTE — ED Provider Notes (Signed)
12:00 AM  Assumed care at shift change.  Patient here eye redness, eyelid swelling and redness.  Sent here from her PCPs office.  Previous provider suspects likely conjunctivitis and blepharitis but lab work and CT scan ordered to rule out orbital cellulitis.  2:05 AM  Pt's labs show no leukocytosis, normal electrolytes and renal function.  CT reviewed and interpreted by myself and the radiologist and shows mild left periorbital edema but no cellulitis or abscess.  Agree with previous provider that this is blepharitis.  She has already been prescribed erythromycin ointment to use at bedtime to the lid margin.  Recommended continuing this for 2 weeks.  Given instructions for lid hygiene, warm compresses.  Given outpatient PCP and ophthalmology follow-up.   At this time, I do not feel there is any life-threatening condition present. I reviewed all nursing notes, vitals, pertinent previous records.  All lab and urine results, EKGs, imaging ordered have been independently reviewed and interpreted by myself.  I reviewed all available radiology reports from any imaging ordered this visit.  Based on my assessment, I feel the patient is safe to be discharged home without further emergent workup and can continue workup as an outpatient as needed. Discussed all findings, treatment plan as well as usual and customary return precautions.  They verbalize understanding and are comfortable with this plan.  Outpatient follow-up has been provided as needed.  All questions have been answered.    Pinki Rottman, Delice Bison, DO 09/24/21 0206

## 2021-12-15 ENCOUNTER — Other Ambulatory Visit: Payer: Self-pay

## 2021-12-15 ENCOUNTER — Encounter: Payer: Self-pay | Admitting: Gastroenterology

## 2021-12-15 ENCOUNTER — Ambulatory Visit: Payer: Medicare HMO | Admitting: Gastroenterology

## 2021-12-15 VITALS — BP 158/68 | HR 84 | Temp 98.1°F | Wt 158.4 lb

## 2021-12-15 DIAGNOSIS — G8929 Other chronic pain: Secondary | ICD-10-CM

## 2021-12-15 DIAGNOSIS — R1013 Epigastric pain: Secondary | ICD-10-CM | POA: Diagnosis not present

## 2021-12-15 MED ORDER — OMEPRAZOLE 40 MG PO CPDR: 40.0000 mg | DELAYED_RELEASE_CAPSULE | Freq: Two times a day (BID) | ORAL | 3 refills | Status: DC

## 2021-12-15 MED ORDER — OMEPRAZOLE 40 MG PO CPDR: 40.0000 mg | DELAYED_RELEASE_CAPSULE | Freq: Two times a day (BID) | ORAL | 3 refills | Status: AC

## 2021-12-15 NOTE — Progress Notes (Signed)
Jonathon Bellows MD, MRCP(U.K) [address]  [place]  [place], [place] [address]  Main: [phone]  Fax: [phone]   Primary Care Physician: [facility]  Primary Gastroenterologist:  Dr. Jonathon Bellows   Chief Complaint  Patient presents with   Gastroesophageal Reflux    HPI: Alyssa Wood is a 61 y.o. female Summary of history :   Initially referred and seen on 10/27/2020 for abdominal pain .She has been having abdominal pain since March April of  2022  Gradually getting worse.  Occurs every time she eats within 15 minutes.  Last for a few hours.  Points to the upper part of her abdomen and radiates to the sides.  Sharp in nature.  Associated nausea and vomiting at times.  She has been taking meloxicam almost daily for many months for shoulder pain.   She has softer than usual stools.  Up to 4 times a day.  No blood in the stool.  Recalls her last colonoscopy was 5 years back and had polyps at that time.  Not on any PPI.   08/16/2019: Hemoglobin 11.9 g, MCV 81 09/26/2020: Ultrasound abdomen complete status postcholecystectomy otherwise negative examination Admitted to hospital 02/2021 with melena    10/27/2020: H pylori positive , treated with clarithromycin based triple therapy. 02/13/2021: B12 , folate, iron studies normal 02/22/2021: Ct renal stone protocol : Sigmoid diverticulosis 02/13/2021: No obvious cause seen for melena, diverticulosis, small colon polyps seen- adenomas.Gastritis  03/13/2021: Capsule study of small bowel: Normal study 02/24/2021 H. pylori breath test negative, hemoglobin 9.6 g    Interval history  08/06/2021 12/09/2021   09/23/2021 hemoglobin 11.2 g 08/06/2021 H. pylori breath test negative  Does not speak any English whole visit conducted via [place] interpreter.  Taking omeprazole 40 mg once a day.  She complains of nausea and abdominal discomfort when she consumes steak or consumes almond milk.  She is being evaluated for  chronic cough.  Denies any NSAID use.  She has had her gallbladder taken out.  No other complaints.  Current Outpatient Medications  Medication Sig Dispense Refill   Calcium Carbonate-Vitamin D 600-400 MG-UNIT tablet Take 1 tablet by mouth 2 (two) times daily.      cefdinir (OMNICEF) 300 MG capsule Take 1 capsule (300 mg total) by mouth 2 (two) times daily. 10 capsule 0   Cyanocobalamin (VITAMIN B 12 PO) Take 1 tablet by mouth daily.     erythromycin ophthalmic ointment Place 1 Application into the left eye at bedtime. 3.5 g 0   gabapentin (NEURONTIN) 300 MG capsule Take 1 capsule (300 mg total) by mouth at bedtime. Follow written titration schedule 90 capsule 0   lisinopril-hydrochlorothiazide (PRINZIDE,ZESTORETIC) 20-12.5 MG tablet Take 1 tablet by mouth daily.      losartan-hydrochlorothiazide (HYZAAR) 50-12.5 MG tablet Take 1 tablet by mouth daily.     metFORMIN (GLUCOPHAGE) 500 MG tablet Take 500 mg by mouth 2 (two) times daily with a meal.     omeprazole (PRILOSEC) 40 MG capsule Take 40 mg by mouth daily.     rosuvastatin (CRESTOR) 10 MG tablet Take 10 mg by mouth at bedtime.     sertraline (ZOLOFT) 50 MG tablet Take 50 mg by mouth daily.     SYMBICORT 160-4.5 MCG/ACT inhaler Inhale 2 puffs into the lungs 2 (two) times daily.     No current facility-administered medications for this visit.    Allergies as of 12/15/2021 - Review Complete 12/15/2021  Allergen Reaction  Noted   Trazodone and nefazodone Anaphylaxis 08/19/2015   Hydrocodone Nausea And Vomiting 08/19/2015    ROS:  General: Negative for anorexia, weight loss, fever, chills, fatigue, weakness. ENT: Negative for hoarseness, difficulty swallowing , nasal congestion. CV: Negative for chest pain, angina, palpitations, dyspnea on exertion, peripheral edema.  Respiratory: Negative for dyspnea at rest, dyspnea on exertion, cough, sputum, wheezing.  GI: See history of present illness. GU:  Negative for dysuria, hematuria,  urinary incontinence, urinary frequency, nocturnal urination.  Endo: Negative for unusual weight change.    Physical Examination:   BP (!) 158/68   Pulse 84   Temp 98.1 F (36.7 C) (Oral)   Wt 158 lb 6.4 oz (71.8 kg)   BMI 25.57 kg/m   General: Well-nourished, well-developed in no acute distress.  Eyes: No icterus. Conjunctivae pink. Mouth: Oropharyngeal mucosa moist and pink , no lesions erythema or exudate. Neuro: Alert and oriented x 3.  Grossly intact. Skin: Warm and dry, no jaundice.   Psych: Alert and cooperative, normal mood and affect.   Imaging Studies: No results found.  Assessment and Plan:   Alyssa Wood is a 61 y.o. y/o female  here to follow up for chronic abdominal pain since February 2022.  Long-term use of meloxicam.   H/o H. pylori infection status posttreatment.  Admitted to the hospital with melena in 2022 evaluation was negative, no clear cause noted likely due to NSAID use.  Capsule study of the small bowel was also negative.  She was anemic but stable at that time.  Has had some improvement in some symptoms but continues to have on and off abdominal discomfort when she consumes steak and almond milk with her coffee.     Plan  Stay off NSAIDs Increase dose of omeprazole to 40 mg twice a day and if she feels better to gradually reduce the dose and go off of it and transition to [place] for food allergies and alpha gal panel Return back to the office if not feeling any better  Dr Jonathon Bellows  MD,MRCP [facility]) Follow up in as needed

## 2021-12-18 LAB — ALPHA-GAL PANEL
Allergen Lamb IgE: <0.1 kU/L
Beef IgE: <0.1 kU/L
IgE (Immunoglobulin E), Serum: 14 IU/mL (ref 6–495)
O215-IgE Alpha-Gal: <0.1 kU/L
Pork IgE: <0.1 kU/L

## 2021-12-18 LAB — FOOD ALLERGY PROFILE
Allergen Corn, IgE: <0.1 kU/L
Clam IgE: <0.1 kU/L
Codfish IgE: <0.1 kU/L
Egg White IgE: <0.1 kU/L
Milk IgE: <0.1 kU/L
Peanut IgE: <0.1 kU/L
Scallop IgE: <0.1 kU/L
Sesame Seed IgE: <0.1 kU/L
Shrimp IgE: <0.1 kU/L
Soybean IgE: <0.1 kU/L
Walnut IgE: <0.1 kU/L
Wheat IgE: <0.1 kU/L

## 2022-02-18 NOTE — Progress Notes (Signed)
PROVIDER NOTE: Information contained herein reflects review and annotations entered in association with encounter. Interpretation of such information and data should be left to medically-trained personnel. Information provided to patient can be located elsewhere in the medical record under "Patient Instructions". Document created using STT-dictation technology, any transcriptional errors that may result from process are unintentional.    Patient: Alyssa Wood  Service Category: E/M  Provider: Gaspar Cola, MD  DOB: 04-11-1960  DOS: 02/22/2022  Referring Provider: Center, Jenny Reichmann*  MRN: [id number]  Specialty: Interventional Pain Management  PCP: [facility], [place]  Type: Established Patient  Setting: Ambulatory outpatient    Location: Office  Delivery: Face-to-face     HPI  Ms. Meghana Tullo, a 62 y.o. year old female, is here today because of her Chronic pain syndrome [G89.4]. Ms. Wood's primary complain today is Neck Pain (Both side) Last encounter: My last encounter with her was on Visit date not found. Pertinent problems: Ms. Faubert has Chronic neck pain (1ry area of Pain) (Bilateral) (R>L); Chronic low back pain (4th area of Pain) (Bilateral) (L>R) w/o sciatica; Chronic upper extremity pain (Bilateral) (R>L); Chronic pain syndrome; GERD (gastroesophageal reflux disease); Chronic shoulder pain (2ry area of Pain) (Bilateral) (R>L); Cervicogenic headache (3ry area of Pain); Cervical Spinal cord stimulator (Fractured Tip); Osteoarthritis; Chronic musculoskeletal pain; Chronic cervical radiculitis (Bilateral) (R>L); Cervical spondylosis with radiculopathy (Bilateral); Chronic shoulder radicular pain (Bilateral); DDD (degenerative disc disease), cervical; Cervicalgia (1ry area of Pain) (Bilateral) (R>L); Neurogenic pain; Chronic neuropathic pain; Chronic hand pain (Bilateral) (R>L); and Chronic trigger finger, middle finger (Right)  on their pertinent problem list. Pain Assessment: Severity of Chronic pain is reported as a 9 /10. Location: Neck Left, Right/pain radiaties down both shoulder, to her fingers. Onset: More than a month ago. Quality: Aching, Burning, Constant, Discomfort, Throbbing, Tightness, Tingling. Timing: Constant. Modifying factor(s): Meds,. Vitals:  height is '5\' 6"'$  (1.676 m) and weight is 158 lb (71.7 kg). Her temperature is 98 F (36.7 C). Her blood pressure is 121/51 (abnormal) and her pulse is 83. Her oxygen saturation is 99%.  BMI: Estimated body mass index is 25.5 kg/m as calculated from the following:   Height as of this encounter: '5\' 6"'$  (1.676 m).   Weight as of this encounter: 158 lb (71.7 kg).  Reason for encounter: medication management.  This patient was last seen on 03/05/2020.  She requested an appointment to the discuss medication management.  I no longer provide this service, but today she will be evaluated for recommendations.   The patient indicates that in January 2024 she had an episode of upper GI bleeding which landed her in the hospital.  As a consequence of that they have discontinued her NSAIDs including the meloxicam.  Unfortunately, the medication was really helping for her shoulder pain and neck pain.  Today she comes in with a flareup of her neck pain, shoulder pain, and also complains of difficulty closing her right hand.  Examination reveals that the patient has a right middle finger trigger finger.  She is also complaining of difficulty closing her right hand with perceived swelling of her knuckles.  She also has painful range of motion of the cervical spine with referred pain down both shoulder, especially affecting the trapezius and suprascapular muscle region.  She also refers that some of this range of motion will cause discomfort and pain going down the hands.  She has a cervical spinal cord stimulator which she indicates that she forgot to recharge and  currently is not  responding.  Physical exam demonstrates painful range of motion of the cervical spine with clear evidence of a right middle finger trigger finger.  She also seems to have some swelling in the right hand.  She has tenderness over both trapezius muscles with decreased range of motion and pain on attempting to do abduction of the shoulders.  Today we will go ahead and order a sed rate and CRP.  I have recommended that she start using over-the-counter turmeric for the inflammation.  She may also be a good candidate to go back on her tramadol.  She is to take tramadol 50 mg, 2 tabs p.o. daily (PRN), and she was also okay to use a maximum of 3/day.  She was able to tolerate that medication well without any problems.  At this point, I have explained to the patient that we no longer take patients for any further medication management and therefore all I can do is all for this regiment as a recommendation for her PCP to prescribe, if they feel comfortable with that.  In addition, we will be sending the patient for a consult to a hand surgeon to see if they can release the right hand trigger finger.  I have taught the patient some techniques to put pressure over the flexor tendon cyst so as to squeeze some of the fluid out and allow it to move through the pulley with more ease.  I have also informed the patient that we could do injections with local anesthetic into the affected area so as to bring the swelling down and decrease some of the pain.  I have warned her that this is not a cure, but it may assist in bringing some of that pain down.  I have also entered a referral to physical therapy to work with her hands so as to improve her range of motion and decrease some of her pain.  We will arrange for the [employer] representative to come in and reprogram her spinal cord stimulator so as to do a rebooting.  She indicates that this quit working for her approximately 1 months ago and since then her pain has  increased.  In addition, we will be ordering x-rays of her shoulders and cervical spine on flexion and extension and I will also order a CT of the cervical spine to evaluate the canal and the placement of her cervical spinal cord stimulator electrodes.  Finally, I will be scheduling the patient to come in for a diagnostic/therapeutic bilateral suprascapular nerve block.  If this works well for her, we will consider radiofrequency ablation.  The plan was shared with the patient who understood and accepted.  Pharmacotherapy Assessment  Analgesic: No chronic opioid analgesics therapy prescribed by our practice. Tramadol 50 mg, 2 tab PO QD (PRN) (100 mg/day of tramadol) (Ok to use 3/day, PRN) MME/day: 10 mg/day.   Monitoring: [place] PMP: PDMP reviewed during this encounter.       Pharmacotherapy: No side-effects or adverse reactions reported. Compliance: No problems identified. Effectiveness: Clinically acceptable.  Chauncey Fischer, RN  02/22/2022  8:35 AM  Sign when Signing Visit Safety precautions to be maintained throughout the outpatient stay will include: orient to surroundings, keep bed in low position, maintain call bell within reach at all times, provide assistance with transfer out of bed and ambulation.     No results found for: "CBDTHCR" No results found for: "D8THCCBX" No results found for: "D9THCCBX"  UDS:  Summary  Date Value  Ref Range Status  07/03/2019 Note  Final    Comment:    ==================================================================== [employer] Select 13 (MW) ==================================================================== Test                             Result       Flag       Units Drug Present and Declared for Prescription Verification   Tramadol                       >6024        EXPECTED   ng/mg creat   O-Desmethyltramadol            >6024        EXPECTED   ng/mg creat   N-Desmethyltramadol            541          EXPECTED   ng/mg creat    Source of  tramadol is a prescription medication. O-desmethyltramadol    and N-desmethyltramadol are expected metabolites of tramadol. ==================================================================== Test                      Result    Flag   Units      Ref Range   Creatinine              83               mg/dL      >=20 ==================================================================== Declared Medications:  The flagging and interpretation on this report are based on the  following declared medications.  Unexpected results may arise from  inaccuracies in the declared medications.  **Note: The testing scope of this panel includes these medications:  Tramadol (Ultram)  **Note: The testing scope of this panel does not include the  following reported medications:  Calcium  Cyanocobalamin  Gabapentin (Neurontin)  Hydrochlorothiazide (Zestoretic)  Lisinopril (Zestoretic)  Meloxicam (Mobic)  Metformin (Glucophage)  Omega-3 Fatty Acids  Omeprazole (Prilosec)  Sertraline (Zoloft)  Topical  Vitamin D ==================================================================== For clinical consultation, please call [phone]. ====================================================================       ROS  Constitutional: Denies any fever or chills Gastrointestinal: No reported hemesis, hematochezia, vomiting, or acute GI distress Musculoskeletal: Denies any acute onset joint swelling, redness, loss of ROM, or weakness Neurological: No reported episodes of acute onset apraxia, aphasia, dysarthria, agnosia, amnesia, paralysis, loss of coordination, or loss of consciousness  Medication Review  Cyanocobalamin, Fish Oil, gabapentin, losartan-hydrochlorothiazide, meloxicam, metFORMIN, omeprazole, rosuvastatin, and sertraline  History Review  Allergy: Ms. Rylee is allergic to trazodone and nefazodone and hydrocodone. Drug: Ms. Neyens  reports no history of drug use. Alcohol:   reports no history of alcohol use. Tobacco:  reports that she has never smoked. She has never used smokeless tobacco. Social: Ms. Alarid  reports that she has never smoked. She has never used smokeless tobacco. She reports that she does not drink alcohol and does not use drugs. Medical:  has a past medical history of Anxiety, Chronic pain, Depression, Diabetes mellitus without complication ([place]), GERD (gastroesophageal reflux disease), and Hypertension. Surgical: Ms. Potts  has a past surgical history that includes Cholecystectomy (1992); Spinal cord stimulator implant (Right, Jan 2013); Shoulder surgery (Right); Wrist surgery (Right); Colonoscopy with propofol (N/A, 08/28/2015); polypectomy (08/28/2015); Esophagogastroduodenoscopy (egd) with propofol (N/A, 02/13/2021); Givens capsule study (N/A, 03/02/2021); and Colonoscopy with propofol (N/A, 02/15/2021). Family: family history includes Cancer in her father; Heart disease  in her father.  Laboratory Chemistry Profile   Renal Lab Results  Component Value Date   BUN 18 09/23/2021   CREATININE 0.92 09/23/2021   BCR 19 01/11/2017   GFRAA >60 08/16/2019   GFRNONAA >60 09/23/2021    Hepatic Lab Results  Component Value Date   AST 32 09/23/2021   ALT 24 09/23/2021   ALBUMIN 4.1 09/23/2021   ALKPHOS 116 09/23/2021   LIPASE 32 02/12/2021    Electrolytes Lab Results  Component Value Date   NA 134 (L) 09/23/2021   K 4.4 09/23/2021   CL 100 09/23/2021   CALCIUM 9.2 09/23/2021   MG 2.3 01/11/2017    Bone Lab Results  Component Value Date   25OHVITD1 22 (L) 01/11/2017   25OHVITD2 2.5 01/11/2017   25OHVITD3 19 01/11/2017    Inflammation (CRP: Acute Phase) (ESR: Chronic Phase) Lab Results  Component Value Date   CRP 2.9 01/11/2017   ESRSEDRATE 47 (H) 01/11/2017         Note: Above Lab results reviewed.  Recent Imaging Review  DG Shoulder Left CLINICAL DATA:  Pain  EXAM: RIGHT SHOULDER - 3 VIEW; LEFT SHOULDER  - 3  VIEW  COMPARISON:  None Available.  FINDINGS: There is no evidence of fracture or dislocation. There is no evidence of arthropathy or other focal bone abnormality. Soft tissues are unremarkable.  IMPRESSION: Negative.  Electronically Signed   By: Sammie Bench M.D.   On: 02/22/2022 10:57 DG Shoulder Right CLINICAL DATA:  Pain  EXAM: RIGHT SHOULDER - 3 VIEW; LEFT SHOULDER - 3  VIEW  COMPARISON:  None Available.  FINDINGS: There is no evidence of fracture or dislocation. There is no evidence of arthropathy or other focal bone abnormality. Soft tissues are unremarkable.  IMPRESSION: Negative.  Electronically Signed   By: Sammie Bench M.D.   On: 02/22/2022 10:57 DG Cervical Spine With Flex & Extend CLINICAL DATA:  Chronic neck pain.  EXAM: CERVICAL SPINE COMPLETE WITH FLEXION AND EXTENSION VIEWS  COMPARISON:  January 11, 2017  FINDINGS: Multilevel mild degenerative joint changes of the cervical spine with narrowed joint space osteophyte formation identified. No acute fracture or dislocation. Neurostimulator identified unchanged.  IMPRESSION: Multilevel mild degenerative joint changes of cervical spine.  Electronically Signed   By: Abelardo Diesel M.D.   On: 02/22/2022 10:55 Note: Reviewed        Physical Exam  General appearance: Well nourished, well developed, and well hydrated. In no apparent acute distress Mental status: Alert, oriented x 3 (person, place, & time)       Respiratory: No evidence of acute respiratory distress Eyes: PERLA Vitals: BP (!) 121/51   Pulse 83   Temp 98 F (36.7 C)   Ht '5\' 6"'$  (1.676 m)   Wt 158 lb (71.7 kg)   SpO2 99%   BMI 25.50 kg/m  BMI: Estimated body mass index is 25.5 kg/m as calculated from the following:   Height as of this encounter: '5\' 6"'$  (1.676 m).   Weight as of this encounter: 158 lb (71.7 kg). Ideal: Ideal body weight: 59.3 kg (130 lb 11.7 oz) Adjusted ideal body weight: 64.2 kg (141 lb 10.2  oz)  Assessment   Diagnosis Status  1. Chronic pain syndrome   2. Cervicalgia (1ry area of Pain) (Bilateral) (R>L)   3. Chronic shoulder pain (2ry area of Pain) (Bilateral) (R>L)   4. Cervicogenic headache (3ry area of Pain)   5. Chronic low back pain (4th area of Pain) (Bilateral) (  L>R) w/o sciatica   6. Cervical Spinal cord stimulator (Fractured Tip)   7. Cervical spondylosis with radiculopathy (Bilateral)   8. Chronic hand pain (Bilateral) (R>L)   9. Chronic trigger finger, middle finger (Right)   10. Disorder of skeletal system   11. Problems influencing health status   12. Hx of upper GI bleed    Worsened Worsened Worsened   Updated Problems: Problem  Chronic hand pain (Bilateral) (R>L)  Chronic trigger finger, middle finger (Right)  Hx of upper GI bleed  Other Long Term (Current) Drug Therapy     Plan of Care  Problem-specific:  No problem-specific Assessment & Plan notes found for this encounter.  Ms. Karynn Deblasi has a current medication list which includes the following long-term medication(s): losartan-hydrochlorothiazide, metformin, omeprazole, rosuvastatin, and gabapentin.  Pharmacotherapy (Medications Ordered): No orders of the defined types were placed in this encounter.  Orders:  Orders Placed This Encounter  Procedures   SUPRASCAPULAR NERVE BLOCK    For shoulder pain.    Standing Status:   Future    Standing Expiration Date:   05/24/2022    Scheduling Instructions:     Purpose: Therapeutic     Laterality: Bilateral     Level(s): Suprascapular notch     Sedation: Patient's choice.     Scheduling Timeframe: ASAA    Order Specific Question:   Where will this procedure be performed?    Answer:   [facility]   DG Cervical Spine With Flex & Extend    Patient presents with axial pain with possible radicular component.  Please evaluate for any evidence of cervical spine instability. Describe the presence of any spondylolisthesis  (Antero- or retrolisthesis). If present, provide displacement "Grade" and measurement in cm. Please describe presence and specific location (Level & Laterality) of any signs of  osteoarthritis, zygapophyseal (Facet) joints DJD (including decreased joint space and/or osteophytosis), DDD, Foraminal narrowing, as well as any sclerosis and/or cyst formation. Please comment on ROM. Patient presents with axial pain with possible radicular component. Please assist [place] in identifying specific level(s) and laterality of any additional findings such as: 1. Facet (Zygapophyseal) joint DJD (Hypertrophy, space narrowing, subchondral sclerosis, and/or osteophyte formation) 2. DDD and/or IVDD (Loss of disc height, desiccation, gas patterns, osteophytes, endplate sclerosis, or "Black disc disease") 3. Pars defects 4. Spondylolisthesis, spondylosis, and/or spondyloarthropathies (include Degree/Grade of displacement in mm) (stability) 5. Vertebral body Fractures (acute/chronic) (state percentage of collapse) 6. Demineralization (osteopenia/osteoporotic) 7. Bone pathology 8. Foraminal narrowing  9. Surgical changes    Standing Status:   Future    Number of Occurrences:   1    Standing Expiration Date:   03/25/2022    Scheduling Instructions:     Please make sure that the patient understands that this needs to be done as soon as possible. Never have the patient do the imaging "just before the next appointment". Inform patient that having the imaging done within the [facility] will expedite the availability of the results and will provide      imaging availability to the requesting physician. In addition inform the patient that the imaging order has an expiration date and will not be renewed if not done within the active period.    Order Specific Question:   Reason for Exam (SYMPTOM  OR DIAGNOSIS REQUIRED)    Answer:   Cervicalgia    Order Specific Question:   Preferred imaging location?    Answer:   [facility]    Order  Specific Question:   Call Results- Best Contact Number?    Answer:   [phone] ([facility])    Order Specific Question:   Radiology Contrast Protocol - do NOT remove file path    Answer:   \\[email or web address]    Order Specific Question:   Release to patient    Answer:   Immediate   CT CERVICAL SPINE WO CONTRAST    Patient presents with axial pain with possible radicular component. Please assist [place] in identifying specific level(s) and laterality of any additional findings such as: 1. Facet (Zygapophyseal) joint DJD (Hypertrophy, space narrowing, subchondral sclerosis, and/or osteophyte formation) 2. DDD and/or IVDD (Loss of disc height, desiccation, gas patterns, osteophytes, endplate sclerosis, or "Black disc disease") 3. Pars defects 4. Spondylolisthesis, spondylosis, and/or spondyloarthropathies (include Degree/Grade of displacement in mm) (stability) 5. Vertebral body Fractures (acute/chronic) (state percentage of collapse) 6. Demineralization (osteopenia/osteoporotic) 7. Bone pathology 8. Foraminal narrowing  9. Surgical changes 10. Central, Lateral Recess, and/or Foraminal Stenosis (include AP diameter of stenosis in mm) 11. Surgical changes (hardware type, status, and presence of fibrosis) 12. Modic Type Changes (MRI only) 13. IVDD (Disc bulge, protrusion, herniation, extrusion) (Level, laterality, extent)    Standing Status:   Future    Standing Expiration Date:   03/25/2022    Scheduling Instructions:     Please make sure that the patient understands that this needs to be done as soon as possible. Never have the patient do the imaging "just before the next appointment". Inform patient that having the imaging done within the [facility] will expedite the availability of the results and will provide      imaging availability to the requesting physician. In addition inform the patient that the imaging order has an  expiration date and will not be renewed if not done within the active period.    Order Specific Question:   Preferred imaging location?    Answer:   [facility]    Order Specific Question:   Call Results- Best Contact Number?    Answer:   [phone] ([facility])    Order Specific Question:   Radiology Contrast Protocol - do NOT remove file path    Answer:   \\[email or web address]   DG Shoulder Right    Please make sure that the patient understands that this needs to be done as soon as possible. Never have the patient do the imaging "just before the next appointment". Inform patient that having the imaging done within the [facility] will expedite the availability of the results and will provide imaging availability to the requesting physician. In addition inform the patient that the imaging order has an expiration date and will not be renewed if not done within the active period.    Standing Status:   Future    Number of Occurrences:   1    Standing Expiration Date:   03/25/2022    Scheduling Instructions:     Imaging must be done as soon as possible. Inform patient that order will expire within 30 days and I will not renew it.    Order Specific Question:   Reason for Exam (SYMPTOM  OR DIAGNOSIS REQUIRED)    Answer:   Right shoulder pain    Order Specific Question:   Preferred imaging location?    Answer:   [facility]    Order Specific Question:   Call Results- Best Contact Number?    Answer:   [phone] ([facility])    Order  Specific Question:   Release to patient    Answer:   Immediate   DG Shoulder Left    Please make sure that the patient understands that this needs to be done as soon as possible. Never have the patient do the imaging "just before the next appointment". Inform patient that having the imaging done within the [facility] will expedite the availability of the results and will provide imaging availability to the  requesting physician. In addition inform the patient that the imaging order has an expiration date and will not be renewed if not done within the active period.    Standing Status:   Future    Number of Occurrences:   1    Standing Expiration Date:   03/25/2022    Scheduling Instructions:     Imaging must be done as soon as possible. Inform patient that order will expire within 30 days and I will not renew it.    Order Specific Question:   Reason for Exam (SYMPTOM  OR DIAGNOSIS REQUIRED)    Answer:   Left shoulder pain    Order Specific Question:   Preferred imaging location?    Answer:   [facility]    Order Specific Question:   Call Results- Best Contact Number?    Answer:   [phone] ([facility])    Order Specific Question:   Release to patient    Answer:   Immediate   Sedimentation rate    Indication: Disorder of skeletal system (M89.9) Cc PCP: [facility], [place]    Order Specific Question:   CC Results    Answer:   PCP-NURSE [[id number]]    Order Specific Question:   Release to patient    Answer:   Immediate   C-reactive protein    Indication: Problems influencing health status (Z78.9) Cc PCP: [facility], [place]    Order Specific Question:   CC Results    Answer:   PCP-NURSE [[id number]]    Order Specific Question:   Release to patient    Answer:   Immediate   Ambulatory referral to Physical Therapy    Referral Priority:   Routine    Referral Type:   Physical Medicine    Referral Reason:   Specialty Services Required    Requested Specialty:   Physical Therapy    Number of Visits Requested:   1   Ambulatory referral to Hand Surgery    Referral Priority:   Routine    Referral Type:   Surgical    Referral Reason:   Specialty Services Required    Requested Specialty:   Hand Surgery    Number of Visits Requested:   1   Follow-up plan:   Return for (Clinic): (B) SSNB #1.     Interventional Therapies  Risk Factors   Considerations:  Anxiety/depression; GERD; HTN; Hx of upper GI bleed   Planned  Pending:   Diagnostic/therapeutic bilateral suprascapular NB #1  Referral to physical therapy to work with hands Referral to hand surgery for possible surgical alternatives to her right hand trigger finger. Diagnostic x-rays and CT of cervical spine Diagnostic lab work: Sed rate, CRP We will try to coordinate one of her visits with that of the [employer] representative so that they can "jumpstart" her spinal cord stimulator again.   Under consideration:   Diagnostic/therapeutic right hand, middle trigger finger injection #1  Diagnostic bilateral suprascapular NB #1    Completed:   Therapeutic right C7-T1 CESI  x2 (08/04/2017) (100/100/100 x 1 day/60/>75)  Diagnostic cervical spinal cord stimulator trial    Completed by other providers:   None at this time   Therapeutic  Palliative (PRN) options:   Palliative right CESI #3   Pharmacotherapy  The patient recently developed an upper GI bleed and had to discontinue the use of all NSAIDs including the meloxicam which was helping control her chronic neck and shoulder pain.  In the past the patient used to take tramadol 50 mg, 2 tabs p.o. daily (PRN) for pain.  I believe that this would be a very appropriate regimen to put the patient back on.  However, we currently do not have them nonpowered to add any more patients to our medication management portion of the practice.  Therefore, I will leave this as a recommendation for the patient's PCP, but we will not be taking over any of her medications.      Recent Visits No visits were found meeting these conditions. Showing recent visits within past 90 days and meeting all other requirements Today's Visits Date Type Provider Dept  02/22/22 Office Visit Milinda Pointer, MD [facility]  Showing today's visits and meeting all other requirements Future Appointments No visits were found meeting these  conditions. Showing future appointments within next 90 days and meeting all other requirements  I discussed the assessment and treatment plan with the patient. The patient was provided an opportunity to ask questions and all were answered. The patient agreed with the plan and demonstrated an understanding of the instructions.  Patient advised to call back or seek an in-person evaluation if the symptoms or condition worsens.  Duration of encounter: 46 minutes.  Total time on encounter, as per AMA guidelines included both the face-to-face and non-face-to-face time personally spent by the physician and/or other qualified health care professional(s) on the day of the encounter (includes time in activities that require the physician or other qualified health care professional and does not include time in activities normally performed by clinical staff). Physician's time may include the following activities when performed: Preparing to see the patient (e.g., pre-charting review of records, searching for previously ordered imaging, lab work, and nerve conduction tests) Review of prior analgesic pharmacotherapies. Reviewing PMP Interpreting ordered tests (e.g., lab work, imaging, nerve conduction tests) Performing post-procedure evaluations, including interpretation of diagnostic procedures Obtaining and/or reviewing separately obtained history Performing a medically appropriate examination and/or evaluation Counseling and educating the patient/family/caregiver Ordering medications, tests, or procedures Referring and communicating with other health care professionals (when not separately reported) Documenting clinical information in the electronic or other health record Independently interpreting results (not separately reported) and communicating results to the patient/ family/caregiver Care coordination (not separately reported)  Note by: Gaspar Cola, MD Date: 02/22/2022; Time: 11:09 AM

## 2022-02-18 NOTE — Patient Instructions (Addendum)
Trigger Point Injection Trigger points are areas where you have pain. A trigger point injection is a shot given in the trigger point to help relieve pain for a few days to a few months. Common places for trigger points include the neck, shoulders, upper back, or lower back. A trigger point injection will not cure long-term (chronic) pain permanently. These injections do not always work for every person. For some people, they can help to relieve pain for a few days to a few months. Tell a health care provider about: Any allergies you have. All medicines you are taking, including vitamins, herbs, eye drops, creams, and over-the-counter medicines. Any problems you or family members have had with anesthetic medicines. Any bleeding problems you have. Any surgeries you have had. Any medical conditions you have. Whether you are pregnant or may be pregnant. What are the risks? Generally, this is a safe procedure. However, problems may occur, including: Infection. Bleeding or bruising. Allergic reaction to the injected medicine. Irritation of the skin around the injection site. What happens before the procedure? Ask your health care provider about: Changing or stopping your regular medicines. This is especially important if you are taking diabetes medicines or blood thinners. Taking medicines such as aspirin and ibuprofen. These medicines can thin your blood. Do not take these medicines unless your health care provider tells you to take them. Taking over-the-counter medicines, vitamins, herbs, and supplements. What happens during the procedure?  Your health care provider will feel for trigger points. A marker may be used to circle the area for the injection. The skin over the trigger point will be washed with a germ-killing soap. You may be given a medicine to help you relax (sedative). A thin needle is used for the injection. You may feel pain or a twitching feeling when the needle enters your  skin. A numbing solution may be injected into the trigger point. Sometimes a medicine to keep down inflammation is also injected. Your health care provider may move the needle around the area where the trigger point is located until the tightness and twitching goes away. After the injection, your health care provider may put gentle pressure over the injection site. The injection site will be covered with a bandage (dressing). The procedure may vary among health care providers and hospitals. What can I expect after treatment? After treatment, you may have soreness and stiffness for 1-2 days. Follow these instructions at home: Injection site care Remove your dressing in a few hours, or as told by your health care provider. Check your injection site every day for signs of infection. Check for: Redness, swelling, or pain. Fluid or blood. Warmth. Pus or a bad smell. Managing pain, stiffness, and swelling If directed, put ice on the affected area. To do this: Put ice in a plastic bag. Place a towel between your skin and the bag. Leave the ice on for 20 minutes, 2-3 times a day. Remove the ice if your skin turns bright red. This is very important. If you cannot feel pain, heat, or cold, you have a greater risk of damage to the area. Activity If you were given a sedative during the procedure, it can affect you for several hours. Do not drive or operate machinery until your health care provider says that it is safe. Do not take baths, swim, or use a hot tub until your health care provider approves. Return to your normal activities as told by your health care provider. Ask your health care provider what  activities are safe for you. General instructions If you were asked to stop your regular medicines, ask your health care provider when you may start taking them again. You may be asked to see an occupational or physical therapist for exercises that reduce muscle strain and stretch the area of the  trigger point. Keep all follow-up visits. This is important. Contact a health care provider if: Your pain comes back, and it is worse than before the injection. You may need more injections. You have chills or a fever. The injection site becomes more painful, red, swollen, or warm to the touch. Summary A trigger point injection is a shot given in the trigger point to help relieve pain. Common places for trigger point injections are the neck, shoulders, upper back, and lower back. These injections do not always work for every person, but for some people, the injections can help to relieve pain for a few days to a few months. Contact a health care provider if symptoms come back or if they are worse than before treatment. Also, get help if the injection site becomes more painful, red, swollen, or warm to the touch. This information is not intended to replace advice given to you by your health care provider. Make sure you discuss any questions you have with your health care provider. Document Revised: 05/06/2020 Document Reviewed: 05/06/2020 Elsevier Patient Education  [place].  ____________________________________________________________________________________________  Patient Information update  To: All of our patients.  Re: Name change.  It has been made official that our current name, "[facility]"   will soon be changed to "[facility]".   The purpose of this change is to eliminate any confusion created by the concept of our practice being a "[facility]". In the past this has led to the misconception that we treat pain primarily by the use of prescription medications.  Nothing can be farther from the truth.   Understanding PAIN MANAGEMENT: To further understand what our practice does, you first have to understand that "Pain Management" is a  subspecialty that requires additional training once a physician has completed their specialty training, which can be in either Anesthesia, Neurology, Psychiatry, or Physical Medicine and Rehabilitation (PMR). Each one of these contributes to the final approach taken by each physician to the management of their patient's pain. To be a "Pain Management Specialist" you must have first completed one of the specialty trainings below.  Anesthesiologists - trained in clinical pharmacology and interventional techniques such as nerve blockade and regional as well as central neuroanatomy. They are trained to block pain before, during, and after surgical interventions.  Neurologists - trained in the diagnosis and pharmacological treatment of complex neurological conditions, such as Multiple Sclerosis, Parkinson's, spinal cord injuries, and other systemic conditions that may be associated with symptoms that may include but are not limited to pain. They tend to rely primarily on the treatment of chronic pain using prescription medications.  Psychiatrist - trained in conditions affecting the psychosocial wellbeing of patients including but not limited to depression, anxiety, schizophrenia, personality disorders, addiction, and other substance use disorders that may be associated with chronic pain. They tend to rely primarily on the treatment of chronic pain using prescription medications.   Physical Medicine and Rehabilitation (PMR) physicians, also known as physiatrists - trained to treat a wide variety of medical conditions affecting the brain, spinal cord, nerves, bones, joints, ligaments, muscles, and tendons. Their  training is primarily aimed at treating patients that have suffered injuries that have caused severe physical impairment. Their training is primarily aimed at the physical therapy and rehabilitation of those patients. They may also work alongside orthopedic surgeons or neurosurgeons using their expertise  in assisting surgical patients to recover after their surgeries.  INTERVENTIONAL PAIN MANAGEMENT is sub-subspecialty of Pain Management.  Our physicians are Board-certified in Anesthesia, Pain Management, and Interventional Pain Management.  This meaning that not only have they been trained and Board-certified in their specialty of Anesthesia, and subspecialty of Pain Management, but they have also received further training in the sub-subspecialty of Interventional Pain Management, in order to become Board-certified as INTERVENTIONAL PAIN MANAGEMENT SPECIALIST.    Mission: Our goal is to use our skills in  [place] as alternatives to the chronic use of prescription opioid medications for the treatment of pain. To make this more clear, we have changed our name to reflect what we do and offer. We will continue to offer medication management assessment and recommendations, but we will not be taking over any patient's medication management.  ____________________________________________________________________________________________

## 2022-02-22 ENCOUNTER — Ambulatory Visit
Admission: RE | Admit: 2022-02-22 | Discharge: 2022-02-22 | Disposition: A | Discharge status: Home / Self Care | Payer: Medicare HMO | Source: Ambulatory Visit | Attending: Pain Medicine | Admitting: Pain Medicine

## 2022-02-22 ENCOUNTER — Ambulatory Visit (HOSPITAL_BASED_OUTPATIENT_CLINIC_OR_DEPARTMENT_OTHER): Payer: Medicare HMO | Admitting: Pain Medicine

## 2022-02-22 ENCOUNTER — Ambulatory Visit
Admission: RE | Admit: 2022-02-22 | Discharge: 2022-02-22 | Disposition: A | Discharge status: Home / Self Care | Payer: Medicare HMO | Attending: Pain Medicine | Admitting: Pain Medicine

## 2022-02-22 ENCOUNTER — Encounter: Payer: Self-pay | Admitting: Pain Medicine

## 2022-02-22 VITALS — BP 121/51 | HR 83 | Temp 98.0°F | Ht 66.0 in | Wt 158.0 lb

## 2022-02-22 DIAGNOSIS — M4722 Other spondylosis with radiculopathy, cervical region: Secondary | ICD-10-CM | POA: Insufficient documentation

## 2022-02-22 DIAGNOSIS — M899 Disorder of bone, unspecified: Secondary | ICD-10-CM

## 2022-02-22 DIAGNOSIS — M25511 Pain in right shoulder: Secondary | ICD-10-CM | POA: Insufficient documentation

## 2022-02-22 DIAGNOSIS — M25512 Pain in left shoulder: Secondary | ICD-10-CM | POA: Insufficient documentation

## 2022-02-22 DIAGNOSIS — Z8719 Personal history of other diseases of the digestive system: Secondary | ICD-10-CM | POA: Insufficient documentation

## 2022-02-22 DIAGNOSIS — Z789 Other specified health status: Secondary | ICD-10-CM | POA: Insufficient documentation

## 2022-02-22 DIAGNOSIS — M79642 Pain in left hand: Secondary | ICD-10-CM

## 2022-02-22 DIAGNOSIS — M79641 Pain in right hand: Secondary | ICD-10-CM | POA: Insufficient documentation

## 2022-02-22 DIAGNOSIS — G894 Chronic pain syndrome: Secondary | ICD-10-CM

## 2022-02-22 DIAGNOSIS — M545 Low back pain, unspecified: Secondary | ICD-10-CM

## 2022-02-22 DIAGNOSIS — G4486 Cervicogenic headache: Secondary | ICD-10-CM | POA: Insufficient documentation

## 2022-02-22 DIAGNOSIS — G8929 Other chronic pain: Secondary | ICD-10-CM | POA: Insufficient documentation

## 2022-02-22 DIAGNOSIS — Z9689 Presence of other specified functional implants: Secondary | ICD-10-CM

## 2022-02-22 DIAGNOSIS — M542 Cervicalgia: Secondary | ICD-10-CM

## 2022-02-22 DIAGNOSIS — M65331 Trigger finger, right middle finger: Secondary | ICD-10-CM | POA: Insufficient documentation

## 2022-02-22 NOTE — Progress Notes (Signed)
Safety precautions to be maintained throughout the outpatient stay will include: orient to surroundings, keep bed in low position, maintain call bell within reach at all times, provide assistance with transfer out of bed and ambulation.  

## 2022-02-23 LAB — C-REACTIVE PROTEIN: CRP: 7 mg/L (ref 0–10)

## 2022-02-23 LAB — SEDIMENTATION RATE: Sed Rate: 37 mm/hr (ref 0–40)

## 2022-02-28 NOTE — Progress Notes (Deleted)
PROVIDER NOTE: Interpretation of information contained herein should be left to medically-trained personnel. Specific patient instructions are provided elsewhere under "Patient Instructions" section of medical record. This document was created in part using STT-dictation technology, any transcriptional errors that may result from this process are unintentional.  Patient: Alyssa Wood Type: Established DOB: Oct 13, 1960 MRN: [id number] PCP: [facility]  Service: Procedure DOS: 03/04/2022 Setting: Ambulatory Location: Ambulatory outpatient facility Delivery: Face-to-face Provider: Gaspar Cola, MD Specialty: Interventional Pain Management Specialty designation: 09 Location: Outpatient facility Ref. Prov.: Milinda Pointer, MD    Primary Reason for Visit: Interventional Pain Management Treatment. CC: No chief complaint on file.  Procedure:           Type: Suprascapular nerve block (SSNB)  ***   Laterality:  Bilateral Level: Superior to scapular spine, lateral to supraspinatus fossa (Suprascapular notch).  Imaging: Fluoroscopic guidance         Anesthesia: Local anesthesia (1-2% Lidocaine) Anxiolysis: None                 Sedation:                         DOS: 03/04/2022  Performed by: Gaspar Cola, MD  Purpose: Diagnostic/Therapeutic Indications: Shoulder pain, severe enough to impact quality of life and/or function. No diagnosis found. NAS-11 score:   Pre-procedure:  /10   Post-procedure:  /10     Target: Suprascapular nerve Location: midway between the medial border of the scapula and the acromion as it runs through the suprascapular notch. Region: Suprascapular, posterior shoulder  Approach: Percutaneous  Neuroanatomy: The suprascapular nerve is the lateral branch of the superior trunk of the brachial plexus. It receives nerve fibers that originate in the nerve roots C5 and C6 (and sometimes C4). It is a mixed nerve, meaning  that it provides both sensory and motor supply for the suprascapular region. Function: The main function of this nerve is to provide motor innervation for two muscles, the supraspinatus and infraspinatus muscles. They are part of the rotator cuff muscles. In addition, the suprascapular nerve provides a sensory supply to the joints of the scapula (glenohumeral and acromioclavicular joints). Rationale (medical necessity): procedure needed and proper for the diagnosis and/or treatment of the patient's medical symptoms and needs.  Position / Prep / Materials:  Position: Prone Materials:  Tray: Block Needle(s):  Type: Spinal  Gauge (G): 22  Length: 3.5 in.  Qty: 1 Prep solution: DuraPrep (Iodine Povacrylex [0.7% available iodine] and Isopropyl Alcohol, 74% w/w) Prep Area: Entire posterior shoulder area. From upper spine to shoulder proper (upper arm), and from lateral neck to lower tip of shoulder blade.   Pre-op H&P Assessment:  Alyssa Wood is a 62 y.o. (year old), female patient, seen today for interventional treatment. She  has a past surgical history that includes Cholecystectomy (1992); Spinal cord stimulator implant (Right, Jan 2013); Shoulder surgery (Right); Wrist surgery (Right); Colonoscopy with propofol (N/A, 08/28/2015); polypectomy (08/28/2015); Esophagogastroduodenoscopy (egd) with propofol (N/A, 02/13/2021); Givens capsule study (N/A, 03/02/2021); and Colonoscopy with propofol (N/A, 02/15/2021). Alyssa Wood has a current medication list which includes the following prescription(s): cyanocobalamin, gabapentin, losartan-hydrochlorothiazide, meloxicam, metformin, fish oil, omeprazole, rosuvastatin, and sertraline. Her primarily concern today is the No chief complaint on file.  Initial Vital Signs:  Pulse/HCG Rate:    Temp:   Resp:   BP:   SpO2:    BMI: Estimated body mass index is 25.5 kg/m as calculated from the  following:   Height as of 02/22/22: '5\' 6"'$  (1.676 m).    Weight as of 02/22/22: 158 lb (71.7 kg).  Risk Assessment: Allergies: Reviewed. She is allergic to trazodone and nefazodone and hydrocodone.  Allergy Precautions: None required Coagulopathies: Reviewed. None identified.  Blood-thinner therapy: None at this time Active Infection(s): Reviewed. None identified. Alyssa Wood is afebrile  Site Confirmation: Alyssa Wood was asked to confirm the procedure and laterality before marking the site Procedure checklist: Completed Consent: Before the procedure and under the influence of no sedative(s), amnesic(s), or anxiolytics, the patient was informed of the treatment options, risks and possible complications. To fulfill our ethical and legal obligations, as recommended by the American Medical Association's Code of Ethics, I have informed the patient of my clinical impression; the nature and purpose of the treatment or procedure; the risks, benefits, and possible complications of the intervention; the alternatives, including doing nothing; the risk(s) and benefit(s) of the alternative treatment(s) or procedure(s); and the risk(s) and benefit(s) of doing nothing. The patient was provided information about the general risks and possible complications associated with the procedure. These may include, but are not limited to: failure to achieve desired goals, infection, bleeding, organ or nerve damage, allergic reactions, paralysis, and death. In addition, the patient was informed of those risks and complications associated to the procedure, such as failure to decrease pain; infection; bleeding; organ or nerve damage with subsequent damage to sensory, motor, and/or autonomic systems, resulting in permanent pain, numbness, and/or weakness of one or several areas of the body; allergic reactions; (i.e.: anaphylactic reaction); and/or death. Furthermore, the patient was informed of those risks and complications associated with the medications. These  include, but are not limited to: allergic reactions (i.e.: anaphylactic or anaphylactoid reaction(s)); adrenal axis suppression; blood sugar elevation that in diabetics may result in ketoacidosis or comma; water retention that in patients with history of congestive heart failure may result in shortness of breath, pulmonary edema, and decompensation with resultant heart failure; weight gain; swelling or edema; medication-induced neural toxicity; particulate matter embolism and blood vessel occlusion with resultant organ, and/or nervous system infarction; and/or aseptic necrosis of one or more joints. Finally, the patient was informed that Medicine is not an exact science; therefore, there is also the possibility of unforeseen or unpredictable risks and/or possible complications that may result in a catastrophic outcome. The patient indicated having understood very clearly. We have given the patient no guarantees and we have made no promises. Enough time was given to the patient to ask questions, all of which were answered to the patient's satisfaction. Alyssa Wood has indicated that she wanted to continue with the procedure. Attestation: I, the ordering provider, attest that I have discussed with the patient the benefits, risks, side-effects, alternatives, likelihood of achieving goals, and potential problems during recovery for the procedure that I have provided informed consent. Date  Time: {CHL [facility]-PAIN TIME CHOICES:21018001}  Pre-Procedure Preparation:  Monitoring: As per clinic protocol. Respiration, ETCO2, SpO2, BP, heart rate and rhythm monitor placed and checked for adequate function Safety Precautions: Patient was assessed for positional comfort and pressure points before starting the procedure. Time-out: I initiated and conducted the "Time-out" before starting the procedure, as per protocol. The patient was asked to participate by confirming the accuracy of the "Time Out" information.  Verification of the correct person, site, and procedure were performed and confirmed by me, the nursing staff, and the patient. "Time-out" conducted as per Joint Commission's Universal Protocol (UP.01.01.01). Time:  Description of Procedure:          Procedural Technique Safety Precautions: Aspiration looking for blood return was conducted prior to all injections. At no point did we inject any substances, as a needle was being advanced. No attempts were made at seeking any paresthesias. Safe injection practices and needle disposal techniques used. Medications properly checked for expiration dates. SDV (single dose vial) medications used. Description of the Procedure: Protocol guidelines were followed. The patient was placed in position over the procedure table. The target area was identified and the area prepped in the usual manner. Skin & deeper tissues infiltrated with local anesthetic. Appropriate amount of time allowed to pass for local anesthetics to take effect. The procedure needles were then advanced to the target area. Proper needle placement secured. Negative aspiration confirmed. Solution injected in intermittent fashion, asking for systemic symptoms every 0.5cc of injectate. The needles were then removed and the area cleansed, making sure to leave some of the prepping solution back to take advantage of its long term bactericidal properties.  There were no vitals filed for this visit.   Start Time:   hrs. End Time:   hrs.  Imaging Guidance (Spinal):          Type of Imaging Technique: Fluoroscopy Guidance (Spinal) Indication(s): Assistance in needle guidance and placement for procedures requiring needle placement in or near specific anatomical locations not easily accessible without such assistance. Exposure Time: Please see nurses notes. Contrast: None used. Fluoroscopic Guidance: I was personally present during the use of fluoroscopy. "Tunnel Vision Technique" used to obtain the best  possible view of the target area. Parallax error corrected before commencing the procedure. "Direction-depth-direction" technique used to introduce the needle under continuous pulsed fluoroscopy. Once target was reached, antero-posterior, oblique, and lateral fluoroscopic projection used confirm needle placement in all planes. Images permanently stored in EMR. Interpretation: No contrast injected. I personally interpreted the imaging intraoperatively. Adequate needle placement confirmed in multiple planes. Permanent images saved into the patient's record.  Antibiotic Prophylaxis:   Anti-infectives (From admission, onward)    None      Indication(s): None identified  Post-operative Assessment:  Post-procedure Vital Signs:  Pulse/HCG Rate:    Temp:   Resp:   BP:   SpO2:    EBL: None  Complications: No immediate post-treatment complications observed by team, or reported by patient.  Note: The patient tolerated the entire procedure well. A repeat set of vitals were taken after the procedure and the patient was kept under observation following institutional policy, for this type of procedure. Post-procedural neurological assessment was performed, showing return to baseline, prior to discharge. The patient was provided with post-procedure discharge instructions, including a section on how to identify potential problems. Should any problems arise concerning this procedure, the patient was given instructions to immediately contact us, at any time, without hesitation. In any case, we plan to contact the patient by telephone for a follow-up status report regarding this interventional procedure.  Comments:  No additional relevant information.  Plan of Care  Orders:  No orders of the defined types were placed in this encounter.  Chronic Opioid Analgesic:  No chronic opioid analgesics therapy prescribed by our practice. Tramadol 50 mg, 2 tab PO QD (PRN) (100 mg/day of tramadol) (Ok to use 3/day,  PRN) MME/day: 10 mg/day.   Medications ordered for procedure: No orders of the defined types were placed in this encounter.  Medications administered: Alyssa Wood had no medications administered during this visit.  See  the medical record for exact dosing, route, and time of administration.  Follow-up plan:   No follow-ups on file.       Interventional Therapies  Risk Factors  Considerations:  Anxiety/depression; GERD; HTN; Hx of upper GI bleed   Planned  Pending:   Diagnostic/therapeutic bilateral suprascapular NB #1  Referral to physical therapy to work with hands Referral to hand surgery for possible surgical alternatives to her right hand trigger finger. Diagnostic x-rays and CT of cervical spine Diagnostic lab work: Sed rate, CRP We will try to coordinate one of her visits with that of the [employer] representative so that they can "jumpstart" her spinal cord stimulator again.   Under consideration:   Diagnostic/therapeutic right hand, middle trigger finger injection #1  Diagnostic bilateral suprascapular NB #1    Completed:   Therapeutic right C7-T1 CESI x2 (08/04/2017) (100/100/100 x 1 day/60/>75)  Diagnostic cervical spinal cord stimulator trial    Completed by other providers:   None at this time   Therapeutic  Palliative (PRN) options:   Palliative right CESI #3   Pharmacotherapy  The patient recently developed an upper GI bleed and had to discontinue the use of all NSAIDs including the meloxicam which was helping control her chronic neck and shoulder pain.  In the past the patient used to take tramadol 50 mg, 2 tabs p.o. daily (PRN) for pain.  I believe that this would be a very appropriate regimen to put the patient back on.  However, we currently do not have them nonpowered to add any more patients to our medication management portion of the practice.  Therefore, I will leave this as a recommendation for the patient's PCP, but we will not be taking  over any of her medications.       Recent Visits Date Type Provider Dept  02/22/22 Office Visit Milinda Pointer, MD [facility]  Showing recent visits within past 90 days and meeting all other requirements Future Appointments Date Type Provider Dept  03/04/22 Appointment Milinda Pointer, MD [facility]  Showing future appointments within next 90 days and meeting all other requirements  Disposition: Discharge home  Discharge (Date  Time): 03/04/2022;   hrs.   Primary Care Physician: [facility] [place] Location: [facility] Note by: Gaspar Cola, MD Date: 03/04/2022; Time: 10:35 AM  Disclaimer:  Medicine is not an exact science. The only guarantee in medicine is that nothing is guaranteed. It is important to note that the decision to proceed with this intervention was based on the information collected from the patient. The Data and conclusions were drawn from the patient's questionnaire, the interview, and the physical examination. Because the information was provided in large part by the patient, it cannot be guaranteed that it has not been purposely or unconsciously manipulated. Every effort has been made to obtain as much relevant data as possible for this evaluation. It is important to note that the conclusions that lead to this procedure are derived in large part from the available data. Always take into account that the treatment will also be dependent on availability of resources and existing treatment guidelines, considered by other Pain Management Practitioners as being common knowledge and practice, at the time of the intervention. For Medico-Legal purposes, it is also important to point out that variation in procedural techniques and pharmacological choices are the acceptable norm. The indications, contraindications, technique, and results of the above procedure should only be interpreted and judged by a  Board-Certified Interventional Pain Specialist with extensive familiarity and expertise in the same exact procedure and technique.

## 2022-03-04 ENCOUNTER — Ambulatory Visit: Payer: Medicare HMO | Admitting: Pain Medicine

## 2022-03-08 NOTE — Progress Notes (Unsigned)
PROVIDER NOTE: Interpretation of information contained herein should be left to medically-trained personnel. Specific patient instructions are provided elsewhere under "Patient Instructions" section of medical record. This document was created in part using STT-dictation technology, any transcriptional errors that may result from this process are unintentional.  Patient: Alyssa Wood Type: Established DOB: 1960-11-03 MRN: [id number] PCP: [facility]  Service: Procedure DOS: 03/09/2022 Setting: Ambulatory Location: Ambulatory outpatient facility Delivery: Face-to-face Provider: Gaspar Cola, MD Specialty: Interventional Pain Management Specialty designation: 09 Location: Outpatient facility Ref. Prov.: Milinda Pointer, MD    Primary Reason for Visit: Interventional Pain Management Treatment. CC: Shoulder Pain (bileral)  Procedure:           Type: Suprascapular nerve block (SSNB) #1  Laterality:  Bilateral Level: Superior to scapular spine, lateral to supraspinatus fossa (Suprascapular notch).  Imaging: Fluoroscopic guidance         Anesthesia: Local anesthesia (1-2% Lidocaine) Anxiolysis: None                 Sedation: No Sedation                       DOS: 03/09/2022  Performed by: Gaspar Cola, MD  Purpose: Diagnostic/Therapeutic Indications: Shoulder pain, severe enough to impact quality of life and/or function. 1. Chronic shoulder pain (2ry area of Pain) (Bilateral) (R>L)   2. Cervicalgia (1ry area of Pain) (Bilateral) (R>L)   3. DDD (degenerative disc disease), cervical   4. Cervical myofascial pain syndrome    NAS-11 score:   Pre-procedure: 7 /10   Post-procedure: 7 /10     Target: Suprascapular nerve Location: midway between the medial border of the scapula and the acromion as it runs through the suprascapular notch. Region: Suprascapular, posterior shoulder  Approach: Percutaneous  Neuroanatomy: The suprascapular  nerve is the lateral branch of the superior trunk of the brachial plexus. It receives nerve fibers that originate in the nerve roots C5 and C6 (and sometimes C4). It is a mixed nerve, meaning that it provides both sensory and motor supply for the suprascapular region. Function: The main function of this nerve is to provide motor innervation for two muscles, the supraspinatus and infraspinatus muscles. They are part of the rotator cuff muscles. In addition, the suprascapular nerve provides a sensory supply to the joints of the scapula (glenohumeral and acromioclavicular joints). Rationale (medical necessity): procedure needed and proper for the diagnosis and/or treatment of the patient's medical symptoms and needs.  Position / Prep / Materials:  Position: Prone Materials:  Tray: Block Needle(s):  Type: Spinal  Gauge (G): 22  Length: 3.5 in.  Qty: 1 Prep solution: DuraPrep (Iodine Povacrylex [0.7% available iodine] and Isopropyl Alcohol, 74% w/w) Prep Area: Entire posterior shoulder area. From upper spine to shoulder proper (upper arm), and from lateral neck to lower tip of shoulder blade.   Pre-op H&P Assessment:  Alyssa Wood is a 63 y.o. (year old), female patient, seen today for interventional treatment. She  has a past surgical history that includes Cholecystectomy (1992); Spinal cord stimulator implant (Right, Jan 2013); Shoulder surgery (Right); Wrist surgery (Right); Colonoscopy with propofol (N/A, 08/28/2015); polypectomy (08/28/2015); Esophagogastroduodenoscopy (egd) with propofol (N/A, 02/13/2021); Givens capsule study (N/A, 03/02/2021); and Colonoscopy with propofol (N/A, 02/15/2021). Alyssa Wood has a current medication list which includes the following prescription(s): cyanocobalamin, losartan-hydrochlorothiazide, meloxicam, metformin, fish oil, omeprazole, rosuvastatin, sertraline, and gabapentin. Her primarily concern today is the Shoulder Pain (bileral)  Initial Vital  Signs:  Pulse/HCG Rate: 73  Temp: (!) 97.3 F (36.3 C) Resp: 16 BP: (!) 144/77 SpO2: 98 %  BMI: Estimated body mass index is 25.02 kg/m as calculated from the following:   Height as of this encounter: '5\' 6"'$  (1.676 m).   Weight as of this encounter: 155 lb (70.3 kg).  Risk Assessment: Allergies: Reviewed. She is allergic to trazodone and nefazodone and hydrocodone.  Allergy Precautions: None required Coagulopathies: Reviewed. None identified.  Blood-thinner therapy: None at this time Active Infection(s): Reviewed. None identified. Alyssa Wood is afebrile  Site Confirmation: Alyssa Wood was asked to confirm the procedure and laterality before marking the site Procedure checklist: Completed Consent: Before the procedure and under the influence of no sedative(s), amnesic(s), or anxiolytics, the patient was informed of the treatment options, risks and possible complications. To fulfill our ethical and legal obligations, as recommended by the American Medical Association's Code of Ethics, I have informed the patient of my clinical impression; the nature and purpose of the treatment or procedure; the risks, benefits, and possible complications of the intervention; the alternatives, including doing nothing; the risk(s) and benefit(s) of the alternative treatment(s) or procedure(s); and the risk(s) and benefit(s) of doing nothing. The patient was provided information about the general risks and possible complications associated with the procedure. These may include, but are not limited to: failure to achieve desired goals, infection, bleeding, organ or nerve damage, allergic reactions, paralysis, and death. In addition, the patient was informed of those risks and complications associated to the procedure, such as failure to decrease pain; infection; bleeding; organ or nerve damage with subsequent damage to sensory, motor, and/or autonomic systems, resulting in permanent pain,  numbness, and/or weakness of one or several areas of the body; allergic reactions; (i.e.: anaphylactic reaction); and/or death. Furthermore, the patient was informed of those risks and complications associated with the medications. These include, but are not limited to: allergic reactions (i.e.: anaphylactic or anaphylactoid reaction(s)); adrenal axis suppression; blood sugar elevation that in diabetics may result in ketoacidosis or comma; water retention that in patients with history of congestive heart failure may result in shortness of breath, pulmonary edema, and decompensation with resultant heart failure; weight gain; swelling or edema; medication-induced neural toxicity; particulate matter embolism and blood vessel occlusion with resultant organ, and/or nervous system infarction; and/or aseptic necrosis of one or more joints. Finally, the patient was informed that Medicine is not an exact science; therefore, there is also the possibility of unforeseen or unpredictable risks and/or possible complications that may result in a catastrophic outcome. The patient indicated having understood very clearly. We have given the patient no guarantees and we have made no promises. Enough time was given to the patient to ask questions, all of which were answered to the patient's satisfaction. Ms. Voit has indicated that she wanted to continue with the procedure. Attestation: I, the ordering provider, attest that I have discussed with the patient the benefits, risks, side-effects, alternatives, likelihood of achieving goals, and potential problems during recovery for the procedure that I have provided informed consent. Date  Time: 03/09/2022 11:18 AM  Pre-Procedure Preparation:  Monitoring: As per clinic protocol. Respiration, ETCO2, SpO2, BP, heart rate and rhythm monitor placed and checked for adequate function Safety Precautions: Patient was assessed for positional comfort and pressure points before  starting the procedure. Time-out: I initiated and conducted the "Time-out" before starting the procedure, as per protocol. The patient was asked to participate by confirming the accuracy of the "Time Out" information. Verification of  the correct person, site, and procedure were performed and confirmed by me, the nursing staff, and the patient. "Time-out" conducted as per Joint Commission's Universal Protocol (UP.01.01.01). Time: 1150  Description of Procedure:          Procedural Technique Safety Precautions: Aspiration looking for blood return was conducted prior to all injections. At no point did we inject any substances, as a needle was being advanced. No attempts were made at seeking any paresthesias. Safe injection practices and needle disposal techniques used. Medications properly checked for expiration dates. SDV (single dose vial) medications used. Description of the Procedure: Protocol guidelines were followed. The patient was placed in position over the procedure table. The target area was identified and the area prepped in the usual manner. Skin & deeper tissues infiltrated with local anesthetic. Appropriate amount of time allowed to pass for local anesthetics to take effect. The procedure needles were then advanced to the target area. Proper needle placement secured. Negative aspiration confirmed. Solution injected in intermittent fashion, asking for systemic symptoms every 0.5cc of injectate. The needles were then removed and the area cleansed, making sure to leave some of the prepping solution back to take advantage of its long term bactericidal properties.  Vitals:   03/09/22 1118 03/09/22 1138 03/09/22 1148 03/09/22 1158  BP: (!) 144/77 (!) 149/72 (!) 155/74 (!) 151/75  Pulse: 73 75 74 73  Resp: '16 17 16 16  '$ Temp: (!) 97.3 F (36.3 C)     TempSrc: Temporal     SpO2: 98% 95% 97% 98%  Weight: 155 lb (70.3 kg)     Height: '5\' 6"'$  (1.676 m)        Start Time: 1150 hrs. End Time: 1155  hrs.  Imaging Guidance (Spinal):          Type of Imaging Technique: Fluoroscopy Guidance (Spinal) Indication(s): Assistance in needle guidance and placement for procedures requiring needle placement in or near specific anatomical locations not easily accessible without such assistance. Exposure Time: Please see nurses notes. Contrast: None used. Fluoroscopic Guidance: I was personally present during the use of fluoroscopy. "Tunnel Vision Technique" used to obtain the best possible view of the target area. Parallax error corrected before commencing the procedure. "Direction-depth-direction" technique used to introduce the needle under continuous pulsed fluoroscopy. Once target was reached, antero-posterior, oblique, and lateral fluoroscopic projection used confirm needle placement in all planes. Images permanently stored in EMR. Interpretation: No contrast injected. I personally interpreted the imaging intraoperatively. Adequate needle placement confirmed in multiple planes. Permanent images saved into the patient's record.  Antibiotic Prophylaxis:   Anti-infectives (From admission, onward)    None      Indication(s): None identified  Post-operative Assessment:  Post-procedure Vital Signs:  Pulse/HCG Rate: 73  Temp: (!) 97.3 F (36.3 C) Resp: 16 BP: (!) 151/75 SpO2: 98 %  EBL: None  Complications: No immediate post-treatment complications observed by team, or reported by patient.  Note: The patient tolerated the entire procedure well. A repeat set of vitals were taken after the procedure and the patient was kept under observation following institutional policy, for this type of procedure. Post-procedural neurological assessment was performed, showing return to baseline, prior to discharge. The patient was provided with post-procedure discharge instructions, including a section on how to identify potential problems. Should any problems arise concerning this procedure, the patient was  given instructions to immediately contact us, at any time, without hesitation. In any case, we plan to contact the patient by telephone for a follow-up  status report regarding this interventional procedure.  Comments:  No additional relevant information.  Plan of Care  Orders:  Orders Placed This Encounter  Procedures   SUPRASCAPULAR NERVE BLOCK    For shoulder pain.    Scheduling Instructions:     Purpose: Diagnostic     Laterality: Bilateral     Level(s): Suprascapular notch     Sedation: Patient's choice.     Scheduling Timeframe: Today    Order Specific Question:   Where will this procedure be performed?    Answer:   [facility]   DG [facility] C-ARM 1-60 MIN NO REPORT    Intraoperative interpretation by procedural physician at [place].    Standing Status:   Standing    Number of Occurrences:   1    Order Specific Question:   Reason for exam:    Answer:   Assistance in needle guidance and placement for procedures requiring needle placement in or near specific anatomical locations not easily accessible without such assistance.   Informed Consent Details: Physician/Practitioner Attestation; Transcribe to consent form and obtain patient signature    Note: Always confirm laterality of pain with Ms. Wood, before procedure.    Order Specific Question:   Physician/Practitioner attestation of informed consent for procedure/surgical case    Answer:   I, the physician/practitioner, attest that I have discussed with the patient the benefits, risks, side effects, alternatives, likelihood of achieving goals and potential problems during recovery for the procedure that I have provided informed consent.    Order Specific Question:   Procedure    Answer:   Suprascapular Nerve Block    Order Specific Question:   Physician/Practitioner performing the procedure    Answer:   Mulan Adan A. Dossie Arbour, MD    Order Specific Question:   Indication/Reason    Answer:    Chronic shoulder pain (arthralgia)   Provide equipment / supplies at bedside    Procedure tray: "Block Tray" (Disposable  single use) Skin infiltration needle: Regular 1.5-in, 25-G, (x1) Block Needle type: Spinal Amount/quantity: 2 Size: Regular (3.5-inch) Gauge: 22G    Standing Status:   Standing    Number of Occurrences:   1    Order Specific Question:   Specify    Answer:   Block Tray   Chronic Opioid Analgesic:  No chronic opioid analgesics therapy prescribed by our practice. Tramadol 50 mg, 2 tab PO QD (PRN) (100 mg/day of tramadol) (Ok to use 3/day, PRN) MME/day: 10 mg/day.   Medications ordered for procedure: Meds ordered this encounter  Medications   lidocaine (XYLOCAINE) 2 % (with pres) injection 400 mg   pentafluoroprop-tetrafluoroeth (GEBAUERS) aerosol   DISCONTD: lactated ringers infusion   DISCONTD: midazolam (VERSED) injection 0.5-2 mg    Make sure Flumazenil is available in the pyxis when using this medication. If oversedation occurs, administer 0.2 mg IV over 15 sec. If after 45 sec no response, administer 0.2 mg again over 1 min; may repeat at 1 min intervals; not to exceed 4 doses (1 mg)   ropivacaine (PF) 2 mg/mL (0.2%) (NAROPIN) injection 9 mL   methylPREDNISolone acetate (DEPO-MEDROL) injection 80 mg   Medications administered: We administered lidocaine, pentafluoroprop-tetrafluoroeth, ropivacaine (PF) 2 mg/mL (0.2%), and methylPREDNISolone acetate.  See the medical record for exact dosing, route, and time of administration.  Follow-up plan:   Return in about 2 weeks (around 03/23/2022) for Proc-day (T,Th), (F2F), (PPE).       Interventional Therapies  Risk Factors  Considerations:  Anxiety/depression; GERD; HTN; Hx of upper GI bleed   Planned  Pending:   Diagnostic/therapeutic bilateral suprascapular NB #1  Referral to physical therapy to work with hands Referral to hand surgery for possible surgical alternatives to her right hand trigger  finger. Diagnostic x-rays and CT of cervical spine Diagnostic lab work: Sed rate, CRP We will try to coordinate one of her visits with that of the [employer] representative so that they can "jumpstart" her spinal cord stimulator again.   Under consideration:   Diagnostic/therapeutic right hand, middle trigger finger injection #1  Diagnostic bilateral suprascapular NB #1    Completed:   Therapeutic right C7-T1 CESI x2 (08/04/2017) (100/100/100 x 1 day/60/>75)  Diagnostic cervical spinal cord stimulator trial    Completed by other providers:   None at this time   Therapeutic  Palliative (PRN) options:   Palliative right CESI #3   Pharmacotherapy  The patient recently developed an upper GI bleed and had to discontinue the use of all NSAIDs including the meloxicam which was helping control her chronic neck and shoulder pain.  In the past the patient used to take tramadol 50 mg, 2 tabs p.o. daily (PRN) for pain.  I believe that this would be a very appropriate regimen to put the patient back on.  However, we currently do not have them nonpowered to add any more patients to our medication management portion of the practice.  Therefore, I will leave this as a recommendation for the patient's PCP, but we will not be taking over any of her medications.        Recent Visits Date Type Provider Dept  02/22/22 Office Visit Milinda Pointer, MD [facility]  Showing recent visits within past 90 days and meeting all other requirements Today's Visits Date Type Provider Dept  03/09/22 Procedure visit Milinda Pointer, MD [facility]  Showing today's visits and meeting all other requirements Future Appointments Date Type Provider Dept  03/23/22 Appointment Milinda Pointer, MD [facility]  Showing future appointments within next 90 days and meeting all other requirements  Disposition: Discharge home  Discharge (Date  Time): 03/09/2022; 1200 hrs.   Primary Care  Physician: [facility] [place] Location: [facility] Note by: Gaspar Cola, MD Date: 03/09/2022; Time: 12:14 PM  Disclaimer:  Medicine is not an Chief Strategy Officer. The only guarantee in medicine is that nothing is guaranteed. It is important to note that the decision to proceed with this intervention was based on the information collected from the patient. The Data and conclusions were drawn from the patient's questionnaire, the interview, and the physical examination. Because the information was provided in large part by the patient, it cannot be guaranteed that it has not been purposely or unconsciously manipulated. Every effort has been made to obtain as much relevant data as possible for this evaluation. It is important to note that the conclusions that lead to this procedure are derived in large part from the available data. Always take into account that the treatment will also be dependent on availability of resources and existing treatment guidelines, considered by other Pain Management Practitioners as being common knowledge and practice, at the time of the intervention. For Medico-Legal purposes, it is also important to point out that variation in procedural techniques and pharmacological choices are the acceptable norm. The indications, contraindications, technique, and results of the above procedure should only be interpreted and judged by a Board-Certified Interventional Pain Specialist with extensive familiarity and  expertise in the same exact procedure and technique.

## 2022-03-09 ENCOUNTER — Ambulatory Visit
Admission: RE | Admit: 2022-03-09 | Discharge: 2022-03-09 | Disposition: A | Discharge status: Home / Self Care | Payer: Medicare HMO | Source: Ambulatory Visit | Attending: Pain Medicine | Admitting: Pain Medicine

## 2022-03-09 ENCOUNTER — Ambulatory Visit: Payer: Medicare HMO | Attending: Pain Medicine | Admitting: Pain Medicine

## 2022-03-09 VITALS — BP 151/75 | HR 73 | Temp 97.3°F | Resp 16 | Ht 66.0 in | Wt 155.0 lb

## 2022-03-09 DIAGNOSIS — M5030 Other cervical disc degeneration, unspecified cervical region: Secondary | ICD-10-CM | POA: Diagnosis present

## 2022-03-09 DIAGNOSIS — M542 Cervicalgia: Secondary | ICD-10-CM | POA: Insufficient documentation

## 2022-03-09 DIAGNOSIS — M25512 Pain in left shoulder: Secondary | ICD-10-CM | POA: Diagnosis not present

## 2022-03-09 DIAGNOSIS — G8929 Other chronic pain: Secondary | ICD-10-CM | POA: Diagnosis not present

## 2022-03-09 DIAGNOSIS — M7918 Myalgia, other site: Secondary | ICD-10-CM | POA: Diagnosis present

## 2022-03-09 DIAGNOSIS — M25511 Pain in right shoulder: Secondary | ICD-10-CM | POA: Diagnosis not present

## 2022-03-09 MED ORDER — PENTAFLUOROPROP-TETRAFLUOROETH EX AERO
INHALATION_SPRAY | Freq: Once | CUTANEOUS | Status: AC
  Administered 2022-03-09: 30 via TOPICAL
  Filled 2022-03-09: qty 116

## 2022-03-09 MED ORDER — ROPIVACAINE HCL 2 MG/ML IJ SOLN
9.0000 mL | Freq: Once | INTRAMUSCULAR | Status: AC
  Administered 2022-03-09: 9 mL via PERINEURAL
  Filled 2022-03-09: qty 20

## 2022-03-09 MED ORDER — LIDOCAINE HCL 2 % IJ SOLN
20.0000 mL | Freq: Once | INTRAMUSCULAR | Status: AC
  Administered 2022-03-09: 400 mg
  Filled 2022-03-09: qty 20

## 2022-03-09 MED ORDER — LACTATED RINGERS IV SOLN: Freq: Once | INTRAVENOUS | Status: DC

## 2022-03-09 MED ORDER — METHYLPREDNISOLONE ACETATE 80 MG/ML IJ SUSP
80.0000 mg | Freq: Once | INTRAMUSCULAR | Status: AC
  Administered 2022-03-09: 80 mg
  Filled 2022-03-09: qty 1

## 2022-03-09 MED ORDER — MIDAZOLAM HCL 2 MG/2ML IJ SOLN
0.5000 mg | Freq: Once | INTRAMUSCULAR | Status: DC
  Filled 2022-03-09: qty 2

## 2022-03-09 NOTE — Patient Instructions (Signed)

## 2022-03-10 ENCOUNTER — Telehealth: Payer: Self-pay

## 2022-03-10 NOTE — Telephone Encounter (Signed)
Post procedure follow up.  Patient states she is doing ok.  

## 2022-03-12 ENCOUNTER — Ambulatory Visit
Admission: RE | Admit: 2022-03-12 | Discharge: 2022-03-12 | Disposition: A | Discharge status: Home / Self Care | Payer: Medicare HMO | Source: Ambulatory Visit | Attending: Pain Medicine | Admitting: Pain Medicine

## 2022-03-12 DIAGNOSIS — M4722 Other spondylosis with radiculopathy, cervical region: Secondary | ICD-10-CM | POA: Insufficient documentation

## 2022-03-12 DIAGNOSIS — M542 Cervicalgia: Secondary | ICD-10-CM | POA: Insufficient documentation

## 2022-03-12 DIAGNOSIS — G4486 Cervicogenic headache: Secondary | ICD-10-CM | POA: Diagnosis present

## 2022-03-12 DIAGNOSIS — Z9689 Presence of other specified functional implants: Secondary | ICD-10-CM | POA: Diagnosis present

## 2022-03-19 NOTE — Progress Notes (Signed)
(  03/23/2022) NO-SHOW. Patient called on day of procedure to cancel appointment. Says she is feeling sick.  Post-procedure evaluation   Type: Suprascapular nerve block (SSNB) #1  Laterality:  Bilateral Level: Superior to scapular spine, lateral to supraspinatus fossa (Suprascapular notch).  Imaging: Fluoroscopic guidance         Anesthesia: Local anesthesia (1-2% Lidocaine) Anxiolysis: None                 Sedation: No Sedation                       DOS: 03/09/2022  Performed by: Oswaldo Done, MD  Purpose: Diagnostic/Therapeutic Indications: Shoulder pain, severe enough to impact quality of life and/or function. 1. Chronic shoulder pain (2ry area of Pain) (Bilateral) (R>L)   2. Cervicalgia (1ry area of Pain) (Bilateral) (R>L)   3. DDD (degenerative disc disease), cervical   4. Cervical myofascial pain syndrome    NAS-11 score:   Pre-procedure: 7 /10   Post-procedure: 7 /10   Effectiveness (03/23/2022):  Initial hour after procedure: 100 %. Subsequent 4-6 hours post-procedure: 100 %. Analgesia past initial 6 hours: 80 % (ongoing).         Interventional Therapies  Risk Factors  Considerations:  Anxiety/depression; GERD; HTN; Hx of upper GI bleed   Planned  Pending:    Referral to physical therapy to work with hands Referral to hand surgery for possible surgical alternatives to her right hand trigger finger. Diagnostic x-rays and CT of cervical spine Diagnostic lab work: Sed rate, CRP We will try to coordinate one of her visits with that of the [employer] representative so that they can "jumpstart" her spinal cord stimulator again.   Under consideration:   Diagnostic/therapeutic right hand, middle trigger finger injection #1  Therapeutic bilateral suprascapular NB #2  Therapeutic bilateral suprascapular nerve RFA #1    Completed:   Diagnostic bilateral suprascapular NB x1 (03/09/2022) (100/100/80/80)  Therapeutic right C7-T1 CESI x2 (08/04/2017) (100/100/100 x 1  day/60/>75)  Diagnostic cervical spinal cord stimulator trial    Completed by other providers:   None at this time   Therapeutic  Palliative (PRN) options:   Palliative right CESI #3   Pharmacotherapy  The patient recently developed an upper GI bleed and had to discontinue the use of all NSAIDs including the meloxicam which was helping control her chronic neck and shoulder pain.  In the past the patient used to take tramadol 50 mg, 2 tabs p.o. daily (PRN) for pain.  I believe that this would be a very appropriate regimen to put the patient back on.  However, we currently do not have them nonpowered to add any more patients to our medication management portion of the practice.  Therefore, I will leave this as a recommendation for the patient's PCP, but we will not be taking over any of her medications.       Note by: Oswaldo Done, MD Date: 03/23/2022; Time: 9:27 AM

## 2022-03-23 ENCOUNTER — Ambulatory Visit (HOSPITAL_BASED_OUTPATIENT_CLINIC_OR_DEPARTMENT_OTHER): Payer: Medicare HMO | Admitting: Pain Medicine

## 2022-03-23 DIAGNOSIS — M25512 Pain in left shoulder: Secondary | ICD-10-CM

## 2022-03-23 DIAGNOSIS — Z91199 Patient's noncompliance with other medical treatment and regimen due to unspecified reason: Secondary | ICD-10-CM

## 2022-03-23 DIAGNOSIS — G8929 Other chronic pain: Secondary | ICD-10-CM

## 2022-03-23 DIAGNOSIS — M25511 Pain in right shoulder: Secondary | ICD-10-CM

## 2022-04-06 ENCOUNTER — Encounter: Payer: Self-pay | Admitting: Pain Medicine

## 2022-04-06 ENCOUNTER — Ambulatory Visit: Payer: Medicare HMO | Attending: Pain Medicine | Admitting: Pain Medicine

## 2022-04-06 VITALS — BP 140/76 | HR 78 | Temp 97.5°F | Resp 16 | Ht 62.0 in | Wt 156.0 lb

## 2022-04-06 DIAGNOSIS — M7912 Myalgia of auxiliary muscles, head and neck: Secondary | ICD-10-CM

## 2022-04-06 DIAGNOSIS — M25511 Pain in right shoulder: Secondary | ICD-10-CM | POA: Diagnosis present

## 2022-04-06 DIAGNOSIS — M542 Cervicalgia: Secondary | ICD-10-CM | POA: Insufficient documentation

## 2022-04-06 DIAGNOSIS — M7918 Myalgia, other site: Secondary | ICD-10-CM | POA: Diagnosis present

## 2022-04-06 DIAGNOSIS — M25512 Pain in left shoulder: Secondary | ICD-10-CM | POA: Insufficient documentation

## 2022-04-06 DIAGNOSIS — G8929 Other chronic pain: Secondary | ICD-10-CM | POA: Diagnosis present

## 2022-04-06 DIAGNOSIS — G894 Chronic pain syndrome: Secondary | ICD-10-CM | POA: Insufficient documentation

## 2022-04-06 NOTE — Progress Notes (Signed)
PROVIDER NOTE: Information contained herein reflects review and annotations entered in association with encounter. Interpretation of such information and data should be left to medically-trained personnel. Information provided to patient can be located elsewhere in the medical record under "Patient Instructions". Document created using STT-dictation technology, any transcriptional errors that may result from process are unintentional.    Patient: Alyssa Wood  Service Category: E/M  Provider: Gaspar Cola, MD  DOB: 1960-06-10  DOS: 04/06/2022  Referring Provider: [facility], Jenny [facility]mann*  MRN: [id number]  Specialty: Interventional Pain Management  PCP: [facility], [place]  Type: Established Patient  Setting: Ambulatory outpatient    Location: Office  Delivery: Face-to-face     HPI  Ms. Cytlaly Norum, a 62 y.o. year old female, is here today because of her Chronic pain of both shoulders [M25.511, G89.29, M25.512]. Ms. Wood's primary complain today is Shoulder Pain (bilaterl) Last encounter: My last encounter with her was on 03/23/2022. Pertinent problems: Ms. Dijoseph has Chronic neck pain (1ry area of Pain) (Bilateral) (R>L); Chronic low back pain (4th area of Pain) (Bilateral) (L>R) w/o sciatica; Chronic upper extremity pain (Bilateral) (R>L); Chronic pain syndrome; Chronic shoulder pain (2ry area of Pain) (Bilateral) (R>L); Cervicogenic headache (3ry area of Pain); Cervical Spinal cord stimulator (Fractured Tip); Osteoarthritis; Chronic musculoskeletal pain; Chronic cervical radiculitis (Bilateral) (R>L); Cervical spondylosis with radiculopathy (Bilateral); Chronic shoulder radicular pain (Bilateral); DDD (degenerative disc disease), cervical; Cervicalgia (1ry area of Pain) (Bilateral) (R>L); Neurogenic pain; Chronic neuropathic pain; Chronic hand pain (Bilateral) (R>L); Chronic trigger finger, middle finger (Right); and Cervical myofascial  pain syndrome on their pertinent problem list. Pain Assessment: Severity of Chronic pain is reported as a 0-No pain/10. Location: Shoulder Right, Left/right arm to the hand, with numbness in fingers on right hand. Onset: More than a month ago. Quality: Burning, Throbbing. Timing: Intermittent. Modifying factor(s): Tramadol. Vitals:  height is '5\' 2"'$  (1.575 m) and weight is 156 lb (70.8 kg). Her temporal temperature is 97.5 F (36.4 C) (abnormal). Her blood pressure is 140/76 (abnormal) and her pulse is 78. Her respiration is 16 and oxygen saturation is 100%.  BMI: Estimated body mass index is 28.53 kg/m as calculated from the following:   Height as of this encounter: '5\' 2"'$  (1.575 m).   Weight as of this encounter: 156 lb (70.8 kg).  Reason for encounter: evaluation of worsening, or previously known (established) problem.  The patient indicates having attained an ongoing 90% improvement after her diagnostic bilateral suprascapular nerve block #1.  Today I went over the results of the cervical CT scan which I have explained to the patient in layman's terms.  Post-procedure evaluation   Type: Suprascapular nerve block (SSNB) #1  Laterality:  Bilateral Level: Superior to scapular spine, lateral to supraspinatus fossa (Suprascapular notch).  Imaging: Fluoroscopic guidance         Anesthesia: Local anesthesia (1-2% Lidocaine) Anxiolysis: None                 Sedation: No Sedation                       DOS: 03/09/2022  Performed by: Gaspar Cola, MD  Purpose: Diagnostic/Therapeutic Indications: Shoulder pain, severe enough to impact quality of life and/or function. 1. Chronic shoulder pain (2ry area of Pain) (Bilateral) (R>L)   2. Cervicalgia (1ry area of Pain) (Bilateral) (R>L)   3. DDD (degenerative disc disease), cervical   4. Cervical myofascial pain syndrome    NAS-11  score:   Pre-procedure: 7 /10   Post-procedure: 7 /10   Effectiveness (03/23/2022):  Initial hour after  procedure: 100 %. Subsequent 4-6 hours post-procedure: 100 %. Analgesia past initial 6 hours: 80 % (ongoing).      Effectiveness (04/06/2022):  Initial hour after procedure: 90 %. Subsequent 4-6 hours post-procedure: 90 %. Analgesia past initial 6 hours: 90 %. Ongoing improvement:  Analgesic: The patient indicates still having an ongoing 90% improvement of her neck and upper extremity pain. Function: Ms. Gambler reports improvement in function ROM: Ms. Butz reports improvement in ROM  Pharmacotherapy Assessment  Analgesic: No chronic opioid analgesics therapy prescribed by our practice. Tramadol 50 mg, 2 tab PO QD (PRN) (100 mg/day of tramadol) (Ok to use 3/day, PRN) MME/day: 10 mg/day.   Monitoring: [place] PMP: PDMP not reviewed this encounter.       Pharmacotherapy: No side-effects or adverse reactions reported. Compliance: No problems identified. Effectiveness: Clinically acceptable.  No notes on file  No results found for: "CBDTHCR" No results found for: "D8THCCBX" No results found for: "D9THCCBX"  UDS:  Summary  Date Value Ref Range Status  07/03/2019 Note  Final    Comment:    ==================================================================== [employer] Select 13 (MW) ==================================================================== Test                             Result       Flag       Units Drug Present and Declared for Prescription Verification   Tramadol                       >6024        EXPECTED   ng/mg creat   O-Desmethyltramadol            >6024        EXPECTED   ng/mg creat   N-Desmethyltramadol            541          EXPECTED   ng/mg creat    Source of tramadol is a prescription medication. O-desmethyltramadol    and N-desmethyltramadol are expected metabolites of tramadol. ==================================================================== Test                      Result    Flag   Units      Ref Range   Creatinine               83               mg/dL      >=20 ==================================================================== Declared Medications:  The flagging and interpretation on this report are based on the  following declared medications.  Unexpected results may arise from  inaccuracies in the declared medications.  **Note: The testing scope of this panel includes these medications:  Tramadol (Ultram)  **Note: The testing scope of this panel does not include the  following reported medications:  Calcium  Cyanocobalamin  Gabapentin (Neurontin)  Hydrochlorothiazide (Zestoretic)  Lisinopril (Zestoretic)  Meloxicam (Mobic)  Metformin (Glucophage)  Omega-3 Fatty Acids  Omeprazole (Prilosec)  Sertraline (Zoloft)  Topical  Vitamin D ==================================================================== For clinical consultation, please call [phone]. ====================================================================       ROS  Constitutional: Denies any fever or chills Gastrointestinal: No reported hemesis, hematochezia, vomiting, or acute GI distress Musculoskeletal: Denies any acute onset joint swelling, redness, loss of ROM, or weakness Neurological: No reported episodes of  acute onset apraxia, aphasia, dysarthria, agnosia, amnesia, paralysis, loss of coordination, or loss of consciousness  Medication Review  Cyanocobalamin, Fish Oil, gabapentin, losartan-hydrochlorothiazide, meloxicam, metFORMIN, omeprazole, rosuvastatin, and sertraline  History Review  Allergy: Ms. Lapointe is allergic to trazodone and nefazodone and hydrocodone. Drug: Ms. Bergfeld  reports no history of drug use. Alcohol:  reports no history of alcohol use. Tobacco:  reports that she has never smoked. She has never used smokeless tobacco. Social: Ms. Werning  reports that she has never smoked. She has never used smokeless tobacco. She reports that she does not drink alcohol and does not use  drugs. Medical:  has a past medical history of Anxiety, Chronic pain, Depression, Diabetes mellitus without complication ([place]), GERD (gastroesophageal reflux disease), and Hypertension. Surgical: Ms. Foore  has a past surgical history that includes Cholecystectomy (1992); Spinal cord stimulator implant (Right, Jan 2013); Shoulder surgery (Right); Wrist surgery (Right); Colonoscopy with propofol (N/A, 08/28/2015); polypectomy (08/28/2015); Esophagogastroduodenoscopy (egd) with propofol (N/A, 02/13/2021); Givens capsule study (N/A, 03/02/2021); and Colonoscopy with propofol (N/A, 02/15/2021). Family: family history includes Cancer in her father; Heart disease in her father.  Laboratory Chemistry Profile   Renal Lab Results  Component Value Date   BUN 18 09/23/2021   CREATININE 0.92 09/23/2021   BCR 19 01/11/2017   GFRAA >60 08/16/2019   GFRNONAA >60 09/23/2021    Hepatic Lab Results  Component Value Date   AST 32 09/23/2021   ALT 24 09/23/2021   ALBUMIN 4.1 09/23/2021   ALKPHOS 116 09/23/2021   LIPASE 32 02/12/2021    Electrolytes Lab Results  Component Value Date   NA 134 (L) 09/23/2021   K 4.4 09/23/2021   CL 100 09/23/2021   CALCIUM 9.2 09/23/2021   MG 2.3 01/11/2017    Bone Lab Results  Component Value Date   25OHVITD1 22 (L) 01/11/2017   25OHVITD2 2.5 01/11/2017   25OHVITD3 19 01/11/2017    Inflammation (CRP: Acute Phase) (ESR: Chronic Phase) Lab Results  Component Value Date   CRP 7 02/22/2022   ESRSEDRATE 37 02/22/2022         Note: Above Lab results reviewed.  Recent Imaging Review  CT CERVICAL SPINE WO CONTRAST CLINICAL DATA:  Chronic neck pain with cervicalgia  EXAM: CT CERVICAL SPINE WITHOUT CONTRAST  TECHNIQUE: Multidetector CT imaging of the cervical spine was performed without intravenous contrast. Multiplanar CT image reconstructions were also generated.  RADIATION DOSE REDUCTION: This exam was performed according to the departmental  dose-optimization program which includes automated exposure control, adjustment of the mA and/or kV according to patient size and/or use of iterative reconstruction technique.  COMPARISON:  Radiography of the cervical spine 02/22/2022  FINDINGS: Alignment: Normal  Skull base and vertebrae: No acute fracture. No primary bone lesion or focal pathologic process.  Soft tissues and spinal canal: No evidence of collection, mass, or inflammation in the soft tissues. Dorsal column stimulator which enters the posterior canal at T1-2, stimulator leads being in the right posterior canal at the level of C2-3 to C5-6.  Disc levels: Mild disc height loss and minor ridging at C3-4 to C5-6. No bony impingement.  Upper chest: Negative  IMPRESSION: 1. Mild degenerative change without bony impingement. 2. Spinal cord stimulator as described.  Electronically Signed   By: Jorje Guild M.D.   On: 03/13/2022 06:20 Note: Reviewed        Physical Exam  General appearance: Well nourished, well developed, and well hydrated. In no apparent acute distress Mental status: Alert,  oriented x 3 (person, place, & time)       Respiratory: No evidence of acute respiratory distress Eyes: PERLA Vitals: BP (!) 140/76   Pulse 78   Temp (!) 97.5 F (36.4 C) (Temporal)   Resp 16   Ht '5\' 2"'$  (1.575 m)   Wt 156 lb (70.8 kg)   SpO2 100%   BMI 28.53 kg/m  BMI: Estimated body mass index is 28.53 kg/m as calculated from the following:   Height as of this encounter: '5\' 2"'$  (1.575 m).   Weight as of this encounter: 156 lb (70.8 kg). Ideal: Ideal body weight: 50.1 kg (110 lb 7.2 oz) Adjusted ideal body weight: 58.4 kg (128 lb 10.7 oz)  Assessment   Diagnosis Status  1. Chronic shoulder pain (2ry area of Pain) (Bilateral) (R>L)   2. Cervicalgia (1ry area of Pain) (Bilateral) (R>L)   3. Cervical myofascial pain syndrome   4. Chronic pain syndrome    Improved Improved Improved   Updated Problems: No  problems updated.  Plan of Care  Problem-specific:  No problem-specific Assessment & Plan notes found for this encounter.  Ms. Desi Fred has a current medication list which includes the following long-term medication(s): losartan-hydrochlorothiazide, metformin, omeprazole, rosuvastatin, and gabapentin.  Pharmacotherapy (Medications Ordered): No orders of the defined types were placed in this encounter.  Orders:  No orders of the defined types were placed in this encounter.  Follow-up plan:   Return if symptoms worsen or fail to improve.      Interventional Therapies  Risk Factors  Considerations:  Anxiety/depression; GERD; HTN; Hx of upper GI bleed   Planned  Pending:    Referral to physical therapy to work with hands Referral to hand surgery for possible surgical alternatives to her right hand trigger finger. Diagnostic x-rays and CT of cervical spine Diagnostic lab work: Sed rate, CRP We will try to coordinate one of her visits with that of the [employer] representative so that they can "jumpstart" her spinal cord stimulator again.   Under consideration:   Diagnostic/therapeutic right hand, middle trigger finger injection #1  Therapeutic bilateral suprascapular NB #2  Therapeutic bilateral suprascapular nerve RFA #1    Completed:   Diagnostic bilateral suprascapular NB x1 (03/09/2022) (100/100/80/90)  Therapeutic right C7-T1 CESI x2 (08/04/2017) (100/100/100 x 1 day/60/>75)  Diagnostic cervical spinal cord stimulator trial    Completed by other providers:   None at this time   Therapeutic  Palliative (PRN) options:   Palliative right CESI #3   Pharmacotherapy  The patient has been using the tramadol to control her pain and she had developed an ulcer after having taken NSAIDs.  The patient was recommended to use Prilosec or Nexium on a regular basis.  Doing so and taking an occasional NSAID should be okay as long as she does not take it regularly.         Recent Visits Date Type Provider Dept  03/09/22 Procedure visit Milinda Pointer, MD [facility]  02/22/22 Office Visit Milinda Pointer, MD [facility]  Showing recent visits within past 90 days and meeting all other requirements Today's Visits Date Type Provider Dept  04/06/22 Office Visit Milinda Pointer, MD [facility]  Showing today's visits and meeting all other requirements Future Appointments No visits were found meeting these conditions. Showing future appointments within next 90 days and meeting all other requirements  I discussed the assessment and treatment plan with the patient. The patient was provided an opportunity to ask  questions and all were answered. The patient agreed with the plan and demonstrated an understanding of the instructions.  Patient advised to call back or seek an in-person evaluation if the symptoms or condition worsens.  Duration of encounter: 30 minutes.  Total time on encounter, as per AMA guidelines included both the face-to-face and non-face-to-face time personally spent by the physician and/or other qualified health care professional(s) on the day of the encounter (includes time in activities that require the physician or other qualified health care professional and does not include time in activities normally performed by clinical staff). Physician's time may include the following activities when performed: Preparing to see the patient (e.g., pre-charting review of records, searching for previously ordered imaging, lab work, and nerve conduction tests) Review of prior analgesic pharmacotherapies. Reviewing PMP Interpreting ordered tests (e.g., lab work, imaging, nerve conduction tests) Performing post-procedure evaluations, including interpretation of diagnostic procedures Obtaining and/or reviewing separately obtained history Performing a medically appropriate examination and/or evaluation Counseling  and educating the patient/family/caregiver Ordering medications, tests, or procedures Referring and communicating with other health care professionals (when not separately reported) Documenting clinical information in the electronic or other health record Independently interpreting results (not separately reported) and communicating results to the patient/ family/caregiver Care coordination (not separately reported)  Note by: Gaspar Cola, MD Date: 04/06/2022; Time: 3:14 PM

## 2022-05-18 ENCOUNTER — Other Ambulatory Visit: Payer: Self-pay | Admitting: Family Medicine

## 2022-05-18 DIAGNOSIS — Z1231 Encounter for screening mammogram for malignant neoplasm of breast: Secondary | ICD-10-CM

## 2022-06-08 ENCOUNTER — Ambulatory Visit
Admission: RE | Admit: 2022-06-08 | Discharge: 2022-06-08 | Disposition: A | Discharge status: Home / Self Care | Payer: Medicare HMO | Source: Ambulatory Visit | Attending: Family Medicine | Admitting: Family Medicine

## 2022-06-08 DIAGNOSIS — Z1231 Encounter for screening mammogram for malignant neoplasm of breast: Secondary | ICD-10-CM | POA: Insufficient documentation

## 2022-06-08 NOTE — Progress Notes (Addendum)
PROVIDER NOTE: Information contained herein reflects review and annotations entered in association with encounter. Interpretation of such information and data should be left to medically-trained personnel. Information provided to patient can be located elsewhere in the medical record under "Patient Instructions". Document created using STT-dictation technology, any transcriptional errors that may result from process are unintentional.    Patient: Alyssa Wood  Service Category: E/M  Provider: Oswaldo Done, MD  DOB: February 17, 1960  DOS: 06/09/2022  Referring Provider: Center, Darcella Gasman*  MRN: [id number]  Specialty: Interventional Pain Management  PCP: [facility]  Type: Established Patient  Setting: Ambulatory outpatient    Location: Office  Delivery: Face-to-face     HPI  Alyssa Wood, a 62 y.o. year old female, is here today because of her Chronic bilateral low back pain without sciatica [M54.50, G89.29]. Ms. Wood's primary complain today is Back Pain (Right ) and Shoulder Pain (Right )  Pertinent problems: Ms. Neiger has Chronic neck pain (1ry area of Pain) (Bilateral) (R>L); Chronic low back pain (4th area of Pain) (Bilateral) (L>R) w/o sciatica; Chronic upper extremity pain (Bilateral) (R>L); Chronic pain syndrome; Chronic shoulder pain (2ry area of Pain) (Bilateral) (R>L); Cervicogenic headache (3ry area of Pain); Cervical Spinal cord stimulator (Fractured Tip); Osteoarthritis; Chronic musculoskeletal pain; Chronic cervical radiculitis (Bilateral) (R>L); Cervical spondylosis with radiculopathy (Bilateral); Chronic shoulder radicular pain (Bilateral); DDD (degenerative disc disease), cervical; Cervicalgia (1ry area of Pain) (Bilateral) (R>L); Neurogenic pain; Chronic neuropathic pain; Chronic hand pain (Bilateral) (R>L); Chronic trigger finger, middle finger (Right); Cervical myofascial pain syndrome; DDD (degenerative  disc disease), lumbar; Low back pain of over 3 months duration; Chronic upper back pain; Thoracic radiculopathy (Right); Chronic pain of right upper extremity; Radicular pain of right upper extremity; Chronic right shoulder pain; and Impaired range of motion of both shoulders on their pertinent problem list. Pain Assessment: Severity of Chronic pain is reported as a 7 /10. Location: Shoulder Right/Radaites from right shoulder to right side of right back into right lower back. Onset: More than a month ago. Quality: Constant, Burning, Discomfort (pulsating). Timing: Constant. Modifying factor(s): Denies now that TENS unit not working. Vitals:  height is 5\' 2"  (1.575 m) and weight is 142 lb (64.4 kg). Her temporal temperature is 97.5 F (36.4 C) (abnormal). Her blood pressure is 150/78 (abnormal) and her pulse is 72. Her respiration is 18 and oxygen saturation is 98%.  BMI: Estimated body mass index is 25.97 kg/m as calculated from the following:   Height as of this encounter: 5\' 2"  (1.575 m).   Weight as of this encounter: 142 lb (64.4 kg). Last encounter: 04/06/2022. Last procedure: 03/09/2022.  Reason for encounter: evaluation of worsening, or previously known (established) problem.  Initially the patient had requested to come in indicating that she was having an increase in the low back pain.  However, upon arrival she says that she is having pain in the shoulders with the right one being worse than the left hand in the area of the neck.  She is also having some problems recharging her spinal cord stimulator unit.  She indicates having called somebody about this, but did not get a clear answer.  Today I have called [employer] and it turns out that her unit is not MRI compatible.  The patient's cervical SCS ([employer]) was implanted on 02/23/2011.  By now this unit is approximately 64 years old and not taking charge as it used to.  I will make arrangements to get the [employer]  representative to come in and  check the unit to see if there is anything that can be done however the batteries on those units used to be FDA approved for approximately 8 to 9 years.  She is currently taking APAP 1,000 mg QD. Tramadol 50 mg PO QD. in the past I used to prescribe the patient's tramadol and at that time she could take up to 3 tablets/day for a total of 150 mg/day.  Currently her prescription is written for 1 tablet/day and this does not seem to be enough.  In addition, the patient apparently stopped taking the gabapentin.  She refers that she was told that it was contributing and irritating her stomach.  Apparently she has GERD and she takes Prilosec 40 mg p.o. twice daily and continues to have problems with it.  I asked her about the meloxicam, which during the medication reconciliation see indicated she was taking, and she was unable to tell me if she was really taking it or not.  I asked her if she had her medications with her today and she did not.  I have encouraged her to bring all of her medicines to her next appointment so that we can review them and get an accurate account of what is it that she is taking and what is it that she is not.  Because the pain that she is is currently experiencing seems to be an exacerbation of her right shoulder and neck pain, today I have provided her with a prescription for a prednisone taper to see if this can help bring that pain down.  In addition, the patient describes having pain going all the way into her hand where she is having difficulties grabbing things and holding onto them.  Evaluation demonstrates that she has a right hand middle trigger finger.  I asked her if she had seen a surgeon for this and she indicated that she has not.  Today I have made arrangements for referral to hand surgery to see if they can surgically fix her trigger finger, which is causing a lot of pain and decrease function.  Since the battery on her spinal cord stimulator is likely to be reaching its  functional and, I have entered a referral to Dr. Marcell Barlow for possible repeat iceman of the battery.  Previously we had evaluated her spinal cord stimulator leads where the images suggested that the top electrode has broken off from the primary lead.  However, the patient indicates that it was still working and reducing some of her shoulder pain, while the battery was working.        With regards to the broken electrode, we will have the [employer] representative to do an impedance test and see if they can assist this in checking the functionality of the electrode before we going to do any battery replacements.  For this reason, as I said before we will have the [employer] representative come in and see the patient to do a full analysis of the spinal cord stimulator device.  Today the patient indicates having not only the neck pain and the shoulder pain, but she is also describing pain that seems to follow a thoracic radicular pattern around the distribution of T2 and T3.  Because of the issues that she is having in the hand with the trigger finger, it is difficult to determine how much of this upper extremity pain is secondary to trigger finger antidromic algesic conduction versus cervical radicular orthodromic conduction.  For this reason,  I will be ordering a nerve conduction test of the upper extremity to assess for further clarity in terms of a possible cervical radiculopathy being responsible for her upper extremity pain and weakness.  The idea is to determine if this weakness is secondary to guarding or neuropathy.  Because of the pain that she is experiencing in the upper back and right thoracic region suggestive of a thoracic radiculopathy, I will be ordering x-rays of the upper thoracic and lower cervical region to evaluate for possible pathology.  Pharmacotherapy Assessment  Analgesic:  No chronic opioid analgesics therapy prescribed by our practice. Tramadol 50 mg, 1 tab PO QD (PRN) (50  mg/day of tramadol)  MME/day: 5 mg/day.   Monitoring: [place] PMP: PDMP reviewed during this encounter.       Pharmacotherapy: No side-effects or adverse reactions reported. Compliance: No problems identified. Effectiveness: Clinically acceptable.  Earlyne Iba, RN  06/09/2022 10:01 AM  Sign when Signing Visit Safety precautions to be maintained throughout the outpatient stay will include: orient to surroundings, keep bed in low position, maintain call bell within reach at all times, provide assistance with transfer out of bed and ambulation.     No results found for: "CBDTHCR" No results found for: "D8THCCBX" No results found for: "D9THCCBX"  UDS:  Summary  Date Value Ref Range Status  07/03/2019 Note  Final    Comment:    ==================================================================== [employer] Select 13 (MW) ==================================================================== Test                             Result       Flag       Units Drug Present and Declared for Prescription Verification   Tramadol                       >6024        EXPECTED   ng/mg creat   O-Desmethyltramadol            >6024        EXPECTED   ng/mg creat   N-Desmethyltramadol            541          EXPECTED   ng/mg creat    Source of tramadol is a prescription medication. O-desmethyltramadol    and N-desmethyltramadol are expected metabolites of tramadol. ==================================================================== Test                      Result    Flag   Units      Ref Range   Creatinine              83               mg/dL      >=60 ==================================================================== Declared Medications:  The flagging and interpretation on this report are based on the  following declared medications.  Unexpected results may arise from  inaccuracies in the declared medications.  **Note: The testing scope of this panel includes these medications:  Tramadol (Ultram)  **Note:  The testing scope of this panel does not include the  following reported medications:  Calcium  Cyanocobalamin  Gabapentin (Neurontin)  Hydrochlorothiazide (Zestoretic)  Lisinopril (Zestoretic)  Meloxicam (Mobic)  Metformin (Glucophage)  Omega-3 Fatty Acids  Omeprazole (Prilosec)  Sertraline (Zoloft)  Topical  Vitamin D ==================================================================== For clinical consultation, please call [phone]. ====================================================================       ROS  Constitutional: Denies any fever or chills Gastrointestinal: No reported hemesis, hematochezia, vomiting, or acute GI distress Musculoskeletal: Denies any acute onset joint swelling, redness, loss of ROM, or weakness Neurological: No reported episodes of acute onset apraxia, aphasia, dysarthria, agnosia, amnesia, paralysis, loss of coordination, or loss of consciousness  Medication Review  Cyanocobalamin, Fish Oil, gabapentin, losartan-hydrochlorothiazide, meloxicam, metFORMIN, omeprazole, predniSONE, rosuvastatin, and sertraline  History Review  Allergy: Ms. Leverette is allergic to trazodone and nefazodone and hydrocodone. Drug: Ms. Schirm  reports no history of drug use. Alcohol:  reports no history of alcohol use. Tobacco:  reports that she has never smoked. She has never used smokeless tobacco. Social: Ms. Campoverde  reports that she has never smoked. She has never used smokeless tobacco. She reports that she does not drink alcohol and does not use drugs. Medical:  has a past medical history of Anxiety, Chronic pain, Depression, Diabetes mellitus without complication (HCC), GERD (gastroesophageal reflux disease), and Hypertension. Surgical: Ms. Lamour  has a past surgical history that includes Cholecystectomy (1992); Spinal cord stimulator implant (Right, Jan 2013); Shoulder surgery (Right); Wrist surgery (Right);  Colonoscopy with propofol (N/A, 08/28/2015); polypectomy (08/28/2015); Esophagogastroduodenoscopy (egd) with propofol (N/A, 02/13/2021); Givens capsule study (N/A, 03/02/2021); and Colonoscopy with propofol (N/A, 02/15/2021). Family: family history includes Cancer in her father; Heart disease in her father.  Laboratory Chemistry Profile   Renal Lab Results  Component Value Date   BUN 18 09/23/2021   CREATININE 0.92 09/23/2021   BCR 19 01/11/2017   GFRAA >60 08/16/2019   GFRNONAA >60 09/23/2021    Hepatic Lab Results  Component Value Date   AST 32 09/23/2021   ALT 24 09/23/2021   ALBUMIN 4.1 09/23/2021   ALKPHOS 116 09/23/2021   LIPASE 32 02/12/2021    Electrolytes Lab Results  Component Value Date   NA 134 (L) 09/23/2021   K 4.4 09/23/2021   CL 100 09/23/2021   CALCIUM 9.2 09/23/2021   MG 2.3 01/11/2017    Bone Lab Results  Component Value Date   25OHVITD1 22 (L) 01/11/2017   25OHVITD2 2.5 01/11/2017   25OHVITD3 19 01/11/2017    Inflammation (CRP: Acute Phase) (ESR: Chronic Phase) Lab Results  Component Value Date   CRP 7 02/22/2022   ESRSEDRATE 37 02/22/2022         Note: Above Lab results reviewed.  Recent Imaging Review  CT CERVICAL SPINE WO CONTRAST CLINICAL DATA:  Chronic neck pain with cervicalgia  EXAM: CT CERVICAL SPINE WITHOUT CONTRAST  TECHNIQUE: Multidetector CT imaging of the cervical spine was performed without intravenous contrast. Multiplanar CT image reconstructions were also generated.  RADIATION DOSE REDUCTION: This exam was performed according to the departmental dose-optimization program which includes automated exposure control, adjustment of the mA and/or kV according to patient size and/or use of iterative reconstruction technique.  COMPARISON:  Radiography of the cervical spine 02/22/2022  FINDINGS: Alignment: Normal  Skull base and vertebrae: No acute fracture. No primary bone lesion or focal pathologic process.  Soft tissues  and spinal canal: No evidence of collection, mass, or inflammation in the soft tissues. Dorsal column stimulator which enters the posterior canal at T1-2, stimulator leads being in the right posterior canal at the level of C2-3 to C5-6.  Disc levels: Mild disc height loss and minor ridging at C3-4 to C5-6. No bony impingement.  Upper chest: Negative  IMPRESSION: 1. Mild degenerative change without bony impingement. 2. Spinal cord stimulator as described.  Electronically Signed   By: Tiburcio Pea  M.D.   On: 03/13/2022 06:20 Note: Reviewed        Physical Exam  General appearance: Well nourished, well developed, and well hydrated. In no apparent acute distress Mental status: Alert, oriented x 3 (person, place, & time)       Respiratory: No evidence of acute respiratory distress Eyes: PERLA Vitals: BP (!) 150/78   Pulse 72   Temp (!) 97.5 F (36.4 C) (Temporal)   Resp 18   Ht 5\' 2"  (1.575 m)   Wt 142 lb (64.4 kg)   SpO2 98%   BMI 25.97 kg/m  BMI: Estimated body mass index is 25.97 kg/m as calculated from the following:   Height as of this encounter: 5\' 2"  (1.575 m).   Weight as of this encounter: 142 lb (64.4 kg). Ideal: Ideal body weight: 50.1 kg (110 lb 7.2 oz) Adjusted ideal body weight: 55.8 kg (123 lb 1.1 oz)  Assessment   Diagnosis Status  1. Chronic low back pain (4th area of Pain) (Bilateral) (L>R) w/o sciatica   2. DDD (degenerative disc disease), lumbar   3. Low back pain of over 3 months duration   4. Acute exacerbation of chronic low back pain   5. Cervical Spinal cord stimulator (Fractured Tip)   6. Chronic cervical radiculitis (Bilateral) (R>L)   7. Chronic shoulder radicular pain (Bilateral)   8. Chronic upper back pain   9. Thoracic radiculopathy (Right)   10. Chronic pain of right upper extremity   11. Chronic trigger finger, middle finger (Right)   12. Radicular pain of right upper extremity   13. Chronic right shoulder pain   14. Cervical  spondylosis with radiculopathy (Bilateral)   15. Impaired range of motion of both shoulders    Controlled Controlled Controlled   Updated Problems: Problem  Ddd (Degenerative Disc Disease), Lumbar  Low Back Pain of Over 3 Months Duration  Chronic Upper Back Pain  Thoracic radiculopathy (Right)  Chronic Pain of Right Upper Extremity  Radicular Pain of Right Upper Extremity  Chronic Right Shoulder Pain  Impaired Range of Motion of Both Shoulders    Plan of Care  Problem-specific:  No problem-specific Assessment & Plan notes found for this encounter.  Ms. Ana Schmuhl has a current medication list which includes the following long-term medication(s): losartan-hydrochlorothiazide, metformin, omeprazole, rosuvastatin, and gabapentin.  Pharmacotherapy (Medications Ordered): Meds ordered this encounter  Medications   predniSONE (DELTASONE) 20 MG tablet    Sig: Take 3 tablets (60 mg total) by mouth daily with breakfast for 3 days, THEN 2 tablets (40 mg total) daily with breakfast for 3 days, THEN 1 tablet (20 mg total) daily with breakfast for 3 days.    Dispense:  18 tablet    Refill:  0   Orders:  Orders Placed This Encounter  Procedures   DG Lumbar Spine Complete W/Bend    Patient presents with axial pain with possible radicular component. Please assist [place] in identifying specific level(s) and laterality of any additional findings such as: 1. Facet (Zygapophyseal) joint DJD (Hypertrophy, space narrowing, subchondral sclerosis, and/or osteophyte formation) 2. DDD and/or IVDD (Loss of disc height, desiccation, gas patterns, osteophytes, endplate sclerosis, or "Black disc disease") 3. Pars defects 4. Spondylolisthesis, spondylosis, and/or spondyloarthropathies (include Degree/Grade of displacement in mm) (stability) 5. Vertebral body Fractures (acute/chronic) (state percentage of collapse) 6. Demineralization (osteopenia/osteoporotic) 7. Bone pathology 8. Foraminal  narrowing  9. Surgical changes    Standing Status:   Future    Number of Occurrences:  1    Standing Expiration Date:   07/10/2022    Scheduling Instructions:     Please make sure that the patient understands that this needs to be done as soon as possible. Never have the patient do the imaging "just before the next appointment". Inform patient that having the imaging done within the [facility] will expedite the availability of the results and will provide      imaging availability to the requesting physician. In addition inform the patient that the imaging order has an expiration date and will not be renewed if not done within the active period.    Order Specific Question:   Reason for Exam (SYMPTOM  OR DIAGNOSIS REQUIRED)    Answer:   Low back pain    Order Specific Question:   Preferred imaging location?    Answer:   [facility]    Order Specific Question:   Call Results- Best Contact Number?    Answer:   [phone] [facility])    Order Specific Question:   Radiology Contrast Protocol - do NOT remove file path    Answer:   \\[email or web address]    Order Specific Question:   Release to patient    Answer:   Immediate   DG Thoracic Spine W/Swimmers    Patient presents with axial pain with possible radicular component. Please assist [place] in identifying specific level(s) and laterality of any additional findings such as: 1. Facet (Zygapophyseal) joint DJD (Hypertrophy, space narrowing, subchondral sclerosis, and/or osteophyte formation) 2. DDD and/or IVDD (Loss of disc height, desiccation, gas patterns, osteophytes, endplate sclerosis, or "Black disc disease") 3. Pars defects 4. Spondylolisthesis, spondylosis, and/or spondyloarthropathies (include Degree/Grade of displacement in mm) (stability) 5. Vertebral body Fractures (acute/chronic) (state percentage of collapse) 6. Demineralization (osteopenia/osteoporotic) 7. Bone pathology 8.  Foraminal narrowing  9. Surgical changes    Standing Status:   Future    Number of Occurrences:   1    Standing Expiration Date:   09/09/2022    Scheduling Instructions:     Please make sure that the patient understands that this needs to be done as soon as possible. Never have the patient do the imaging "just before the next appointment". Inform patient that having the imaging done within the [facility] will expedite the availability of the results and will provide      imaging availability to the requesting physician. In addition inform the patient that the imaging order has an expiration date and will not be renewed if not done within the active period.    Order Specific Question:   Reason for Exam (SYMPTOM  OR DIAGNOSIS REQUIRED)    Answer:   Upper/thoracic back pain    Order Specific Question:   Preferred imaging location?    Answer:   [facility]    Order Specific Question:   Release to patient    Answer:   Immediate    Order Specific Question:   Call Results- Best Contact Number?    Answer:   [phone] ([facility])   CT SHOULDER RIGHT WO CONTRAST    Standing Status:   Future    Standing Expiration Date:   07/10/2022    Scheduling Instructions:     Please make sure that the patient understands that this needs to be done as soon as possible. Never have the patient do the imaging "just before the next appointment". Inform patient that having the imaging done within the [facility] will expedite the availability of  the results and will provide      imaging availability to the requesting physician. In addition inform the patient that the imaging order has an expiration date and will not be renewed if not done within the active period.    Order Specific Question:   Preferred imaging location?    Answer:   [facility]    Order Specific Question:   Call Results- Best Contact Number?    Answer:   [phone] [facility])    Order Specific Question:    Radiology Contrast Protocol - do NOT remove file path    Answer:   \\[email or web address]   Ambulatory referral to Physical Therapy    Referral Priority:   Routine    Referral Type:   Physical Medicine    Referral Reason:   Specialty Services Required    Requested Specialty:   Physical Therapy    Number of Visits Requested:   1   Ambulatory referral to Neurosurgery    Referral Priority:   Routine    Referral Type:   Surgical    Referral Reason:   Specialty Services Required    Referred to Provider:   Venetia Night, MD    Requested Specialty:   Neurosurgery    Number of Visits Requested:   1   Ambulatory referral to Hand Surgery    Referral Priority:   Routine    Referral Type:   Surgical    Referral Reason:   Specialty Services Required    Requested Specialty:   Hand Surgery    Number of Visits Requested:   1   NCV with EMG(electromyography)    Bilateral testing requested.    Standing Status:   Future    Standing Expiration Date:   08/09/2022    Scheduling Instructions:     Please refer this patient to [facility] for Nerve Conduction testing of the upper extremities. (EMG & PNCV)    Order Specific Question:   Where should this test be performed?    Answer:   other    Order Specific Question:   Release to patient    Answer:   Immediate   Follow-up plan:   Return in about 2 weeks (around 06/23/2022) for Eval-day (M,W), (F2F), for review of ordered tests.      Interventional Therapies  Risk Factors  Considerations:  Anxiety/depression; GERD; HTN; Hx of upper GI bleed   Planned  Pending:   Referral to physical therapy to work with hands Referral to hand surgery for possible surgical alternatives to her right hand trigger finger. Diagnostic lab work: Sed rate, CRP We will try to coordinate one of her visits with that of the [employer] representative so that they can "jumpstart" her spinal cord stimulator again.   Under consideration:    Diagnostic/therapeutic right hand, middle trigger finger injection #1  Therapeutic bilateral suprascapular NB #2  Therapeutic bilateral suprascapular nerve RFA #1    Completed:   Diagnostic bilateral suprascapular NB x1 (03/09/2022) (100/100/80/90)  Therapeutic right C7-T1 CESI x2 (08/04/2017) (100/100/100 x 1 day/60/>75)  Diagnostic cervical spinal cord stimulator trial    Completed by other providers:   None at this time   Therapeutic  Palliative (PRN) options:   Palliative right CESI #3   Pharmacotherapy  The patient has been using the tramadol to control her pain and she had developed an ulcer after having taken NSAIDs.  The patient was recommended to use Prilosec or Nexium on a regular basis.  Doing so and  taking an occasional NSAID should be okay as long as she does not take it regularly.         Recent Visits Date Type Provider Dept  04/06/22 Office Visit [place] Metz, MD [facility]  Showing recent visits within past 90 days and meeting all other requirements Today's Visits Date Type Provider Dept  06/09/22 Office Visit Delano Metz, MD [facility]  Showing today's visits and meeting all other requirements Future Appointments Date Type Provider Dept  07/07/22 Appointment Delano Metz, MD [facility]  Showing future appointments within next 90 days and meeting all other requirements  I discussed the assessment and treatment plan with the patient. The patient was provided an opportunity to ask questions and all were answered. The patient agreed with the plan and demonstrated an understanding of the instructions.  Patient advised to call back or seek an in-person evaluation if the symptoms or condition worsens.  Duration of encounter: 128 minutes.  Total time on encounter, as per AMA guidelines included both the face-to-face and non-face-to-face time personally spent by the physician and/or other qualified health care  professional(s) on the day of the encounter (includes time in activities that require the physician or other qualified health care professional and does not include time in activities normally performed by clinical staff). Physician's time may include the following activities when performed: Preparing to see the patient (e.g., pre-charting review of records, searching for previously ordered imaging, lab work, and nerve conduction tests) Review of prior analgesic pharmacotherapies. Reviewing PMP Interpreting ordered tests (e.g., lab work, imaging, nerve conduction tests) Performing post-procedure evaluations, including interpretation of diagnostic procedures Obtaining and/or reviewing separately obtained history Performing a medically appropriate examination and/or evaluation Counseling and educating the patient/family/caregiver Ordering medications, tests, or procedures Referring and communicating with other health care professionals (when not separately reported) Documenting clinical information in the electronic or other health record Independently interpreting results (not separately reported) and communicating results to the patient/ family/caregiver Care coordination (not separately reported)  Note by: Oswaldo Done, MD Date: 06/09/2022; Time: 12:21 PM

## 2022-06-09 ENCOUNTER — Encounter: Payer: Self-pay | Admitting: Pain Medicine

## 2022-06-09 ENCOUNTER — Ambulatory Visit
Admission: RE | Admit: 2022-06-09 | Discharge: 2022-06-09 | Disposition: A | Discharge status: Home / Self Care | Payer: Medicare HMO | Source: Ambulatory Visit | Attending: Pain Medicine | Admitting: Pain Medicine

## 2022-06-09 ENCOUNTER — Ambulatory Visit (HOSPITAL_BASED_OUTPATIENT_CLINIC_OR_DEPARTMENT_OTHER): Payer: Medicare HMO | Admitting: Pain Medicine

## 2022-06-09 VITALS — BP 150/78 | HR 72 | Temp 97.5°F | Resp 18 | Ht 62.0 in | Wt 142.0 lb

## 2022-06-09 DIAGNOSIS — M549 Dorsalgia, unspecified: Secondary | ICD-10-CM | POA: Insufficient documentation

## 2022-06-09 DIAGNOSIS — M5412 Radiculopathy, cervical region: Secondary | ICD-10-CM | POA: Insufficient documentation

## 2022-06-09 DIAGNOSIS — Z9689 Presence of other specified functional implants: Secondary | ICD-10-CM | POA: Insufficient documentation

## 2022-06-09 DIAGNOSIS — M5414 Radiculopathy, thoracic region: Secondary | ICD-10-CM

## 2022-06-09 DIAGNOSIS — M25612 Stiffness of left shoulder, not elsewhere classified: Secondary | ICD-10-CM | POA: Insufficient documentation

## 2022-06-09 DIAGNOSIS — M25511 Pain in right shoulder: Secondary | ICD-10-CM | POA: Insufficient documentation

## 2022-06-09 DIAGNOSIS — M79601 Pain in right arm: Secondary | ICD-10-CM

## 2022-06-09 DIAGNOSIS — G8929 Other chronic pain: Secondary | ICD-10-CM | POA: Insufficient documentation

## 2022-06-09 DIAGNOSIS — M792 Neuralgia and neuritis, unspecified: Secondary | ICD-10-CM | POA: Insufficient documentation

## 2022-06-09 DIAGNOSIS — M545 Low back pain, unspecified: Secondary | ICD-10-CM | POA: Insufficient documentation

## 2022-06-09 DIAGNOSIS — M25611 Stiffness of right shoulder, not elsewhere classified: Secondary | ICD-10-CM

## 2022-06-09 DIAGNOSIS — M4722 Other spondylosis with radiculopathy, cervical region: Secondary | ICD-10-CM

## 2022-06-09 DIAGNOSIS — M5136 Other intervertebral disc degeneration, lumbar region: Secondary | ICD-10-CM | POA: Insufficient documentation

## 2022-06-09 DIAGNOSIS — M65331 Trigger finger, right middle finger: Secondary | ICD-10-CM | POA: Insufficient documentation

## 2022-06-09 MED ORDER — PREDNISONE 20 MG PO TABS: ORAL_TABLET | ORAL | 0 refills | Status: AC

## 2022-06-09 NOTE — Progress Notes (Signed)
Safety precautions to be maintained throughout the outpatient stay will include: orient to surroundings, keep bed in low position, maintain call bell within reach at all times, provide assistance with transfer out of bed and ambulation.  

## 2022-06-20 NOTE — Progress Notes (Unsigned)
PROVIDER NOTE: Information contained herein reflects review and annotations entered in association with encounter. Interpretation of such information and data should be left to medically-trained personnel. Information provided to patient can be located elsewhere in the medical record under "Patient Instructions". Document created using STT-dictation technology, any transcriptional errors that may result from process are unintentional.    Patient: Alyssa Wood  Service Category: E/M  Provider: Oswaldo Done, MD  DOB: 07/25/1960  DOS: 06/21/2022  Referring Provider: Center, Darcella Gasman*  MRN: [id number]  Specialty: Interventional Pain Management  PCP: [facility]  Type: Established Patient  Setting: Ambulatory outpatient    Location: Office  Delivery: Face-to-face     HPI  Alyssa Wood, a 62 y.o. year old female, is here today because of her No primary diagnosis found.. Alyssa Wood's primary complain today is No chief complaint on file.  Pertinent problems: Alyssa Wood has Chronic neck pain (1ry area of Pain) (Bilateral) (R>L); Chronic low back pain (4th area of Pain) (Bilateral) (L>R) w/o sciatica; Chronic upper extremity pain (Bilateral) (R>L); Chronic pain syndrome; Chronic shoulder pain (2ry area of Pain) (Bilateral) (R>L); Cervicogenic headache (3ry area of Pain); Cervical Spinal cord stimulator (Fractured Tip); Osteoarthritis; Chronic musculoskeletal pain; Chronic cervical radiculitis (Bilateral) (R>L); Cervical spondylosis with radiculopathy (Bilateral); Chronic shoulder radicular pain (Bilateral); DDD (degenerative disc disease), cervical; Cervicalgia (1ry area of Pain) (Bilateral) (R>L); Neurogenic pain; Chronic neuropathic pain; Chronic hand pain (Bilateral) (R>L); Chronic trigger finger, middle finger (Right); Cervical myofascial pain syndrome; DDD (degenerative disc disease), lumbar; Low back pain of over 3 months  duration; Chronic upper back pain; Thoracic radiculopathy (Right); Chronic pain of right upper extremity; Radicular pain of right upper extremity; Chronic right shoulder pain; and Impaired range of motion of both shoulders on their pertinent problem list. Pain Assessment: Severity of   is reported as a  /10. Location:    / . Onset:  . Quality:  . Timing:  . Modifying factor(s):  [name] Vitals:  vitals were not taken for this visit.  BMI: Estimated body mass index is 25.97 kg/m as calculated from the following:   Height as of 06/09/22: 5\' 2"  (1.575 m).   Weight as of 06/09/22: 142 lb (64.4 kg). Last encounter: 06/09/2022. Last procedure: 03/09/2022.  Reason for encounter: follow-up evaluation. ***    Pharmacotherapy Assessment  Analgesic: No chronic opioid analgesics therapy prescribed by our practice. Tramadol 50 mg, 1 tab PO QD (PRN) (50 mg/day of tramadol)  MME/day: 5 mg/day.   Monitoring: [place] PMP: PDMP reviewed during this encounter.       Pharmacotherapy: No side-effects or adverse reactions reported. Compliance: No problems identified. Effectiveness: Clinically acceptable.  No notes on file  No results found for: "CBDTHCR" No results found for: "D8THCCBX" No results found for: "D9THCCBX"  UDS:  Summary  Date Value Ref Range Status  07/03/2019 Note  Final    Comment:    ==================================================================== [employer] 13 (MW) ==================================================================== Test                             Result       Flag       Units Drug Present and Declared for Prescription Verification   Tramadol                       >6024        EXPECTED   ng/mg creat   O-Desmethyltramadol            >  6024        EXPECTED   ng/mg creat   N-Desmethyltramadol            541          EXPECTED   ng/mg creat    Source of tramadol is a prescription medication. O-desmethyltramadol    and N-desmethyltramadol are expected metabolites of  tramadol. ==================================================================== Test                      Result    Flag   Units      Ref Range   Creatinine              83               mg/dL      >=16 ==================================================================== Declared Medications:  The flagging and interpretation on this report are based on the  following declared medications.  Unexpected results may arise from  inaccuracies in the declared medications.  **Note: The testing scope of this panel includes these medications:  Tramadol (Ultram)  **Note: The testing scope of this panel does not include the  following reported medications:  Calcium  Cyanocobalamin  Gabapentin (Neurontin)  Hydrochlorothiazide (Zestoretic)  Lisinopril (Zestoretic)  Meloxicam (Mobic)  Metformin (Glucophage)  Omega-3 Fatty Acids  Omeprazole (Prilosec)  Sertraline (Zoloft)  Topical  Vitamin D ==================================================================== For clinical consultation, please call [phone]. ====================================================================       ROS  Constitutional: Denies any fever or chills Gastrointestinal: No reported hemesis, hematochezia, vomiting, or acute GI distress Musculoskeletal: Denies any acute onset joint swelling, redness, loss of ROM, or weakness Neurological: No reported episodes of acute onset apraxia, aphasia, dysarthria, agnosia, amnesia, paralysis, loss of coordination, or loss of consciousness  Medication Review  Cyanocobalamin, Fish Oil, gabapentin, losartan-hydrochlorothiazide, meloxicam, metFORMIN, omeprazole, rosuvastatin, and sertraline  History Review  Allergy: Alyssa Wood is allergic to trazodone and nefazodone and hydrocodone. Drug: Alyssa Wood  reports no history of drug use. Alcohol:  reports no history of alcohol use. Tobacco:  reports that she has never smoked. She has never used smokeless  tobacco. Social: Ms. Buckle  reports that she has never smoked. She has never used smokeless tobacco. She reports that she does not drink alcohol and does not use drugs. Medical:  has a past medical history of Anxiety, Chronic pain, Depression, Diabetes mellitus without complication (HCC), GERD (gastroesophageal reflux disease), and Hypertension. Surgical: Alyssa Wood  has a past surgical history that includes Cholecystectomy (1992); Spinal cord stimulator implant (Right, Jan 2013); Shoulder surgery (Right); Wrist surgery (Right); Colonoscopy with propofol (N/A, 08/28/2015); polypectomy (08/28/2015); Esophagogastroduodenoscopy (egd) with propofol (N/A, 02/13/2021); Givens capsule study (N/A, 03/02/2021); and Colonoscopy with propofol (N/A, 02/15/2021). Family: family history includes Cancer in her father; Heart disease in her father.  Laboratory Chemistry Profile   Renal Lab Results  Component Value Date   BUN 18 09/23/2021   CREATININE 0.92 09/23/2021   BCR 19 01/11/2017   GFRAA >60 08/16/2019   GFRNONAA >60 09/23/2021    Hepatic Lab Results  Component Value Date   AST 32 09/23/2021   ALT 24 09/23/2021   ALBUMIN 4.1 09/23/2021   ALKPHOS 116 09/23/2021   LIPASE 32 02/12/2021    Electrolytes Lab Results  Component Value Date   NA 134 (L) 09/23/2021   K 4.4 09/23/2021   CL 100 09/23/2021   CALCIUM 9.2 09/23/2021   MG 2.3 01/11/2017    Bone Lab Results  Component Value Date  25OHVITD1 22 (L) 01/11/2017   25OHVITD2 2.5 01/11/2017   25OHVITD3 19 01/11/2017    Inflammation (CRP: Acute Phase) (ESR: Chronic Phase) Lab Results  Component Value Date   CRP 7 02/22/2022   ESRSEDRATE 37 02/22/2022         Note: Above Lab results reviewed.  Recent Imaging Review  DG Thoracic Spine W/Swimmers CLINICAL DATA:  Mid back pain.  EXAM: THORACIC SPINE - 3 VIEWS  COMPARISON:  12/31/2011  FINDINGS: Evidence of patient's neurostimulator device cervical canal  with superior tip just off the film but likely at the C2 level and unchanged. Thoracic spine otherwise demonstrates normal alignment and vertebral body heights. Pedicles are intact. There is minimal spondylosis of the thoracic spine. No evidence of compression fracture or subluxation.  IMPRESSION: 1. No acute findings. 2. Minimal spondylosis of the thoracic spine.  Electronically Signed   By: Elberta Fortis M.D.   On: 06/13/2022 11:48   DG Lumbar Spine Complete W/Bend CLINICAL DATA:  Low back pain.  EXAM: LUMBAR SPINE - COMPLETE WITH BENDING VIEWS  COMPARISON:  01/11/2017  FINDINGS: Vertebral body alignment and heights are normal. Mild disc space narrowing at the L3-4 and L5-S1 levels. There is mild spondylosis of the lumbar spine to include facet arthropathy. No compression fracture or spondylolisthesis/spondylolysis. Neurostimulator device is present and unchanged.  IMPRESSION: 1. No acute findings.  Mild spondylosis of the lumbar spine. 2. Mild disc disease at the L3-4 and L5-S1 levels.  Electronically Signed   By: Elberta Fortis M.D.   On: 06/13/2022 11:46   Note: Reviewed        Physical Exam  General appearance: Well nourished, well developed, and well hydrated. In no apparent acute distress Mental status: Alert, oriented x 3 (person, place, & time)       Respiratory: No evidence of acute respiratory distress Eyes: PERLA Vitals: There were no vitals taken for this visit. BMI: Estimated body mass index is 25.97 kg/m as calculated from the following:   Height as of 06/09/22: 5\' 2"  (1.575 m).   Weight as of 06/09/22: 142 lb (64.4 kg). Ideal: Ideal body weight: 50.1 kg (110 lb 7.2 oz) Adjusted ideal body weight: 55.8 kg (123 lb 1.1 oz)  Assessment   Diagnosis Status  No diagnosis found. Controlled Controlled Controlled   Updated Problems: No problems updated.  Plan of Care  Problem-specific:  No problem-specific Assessment & Plan notes found for this  encounter.  Alyssa Wood has a current medication list which includes the following long-term medication(s): gabapentin, losartan-hydrochlorothiazide, metformin, omeprazole, and rosuvastatin.  Pharmacotherapy (Medications Ordered): No orders of the defined types were placed in this encounter.  Orders:  No orders of the defined types were placed in this encounter.  Follow-up plan:   No follow-ups on file.      Interventional Therapies  Risk Factors  Considerations:  Anxiety/depression; GERD; HTN; Hx of upper GI bleed   Planned  Pending:   Referral to physical therapy to work with hands Referral to hand surgery for possible surgical alternatives to her right hand trigger finger. Diagnostic lab work: Sed rate, CRP We will try to coordinate one of her visits with that of the [employer] representative so that they can "jumpstart" her spinal cord stimulator again.   Under consideration:   Diagnostic/therapeutic right hand, middle trigger finger injection #1  Therapeutic bilateral suprascapular NB #2  Therapeutic bilateral suprascapular nerve RFA #1    Completed:   Diagnostic bilateral suprascapular NB x1 (03/09/2022) (100/100/80/90)  Therapeutic right C7-T1 CESI x2 (08/04/2017) (100/100/100 x 1 day/60/>75)  Diagnostic cervical spinal cord stimulator trial    Completed by other providers:   None at this time   Therapeutic  Palliative (PRN) options:   Palliative right CESI #3   Pharmacotherapy  The patient has been using the tramadol to control her pain and she had developed an ulcer after having taken NSAIDs.  The patient was recommended to use Prilosec or Nexium on a regular basis.  Doing so and taking an occasional NSAID should be okay as long as she does not take it regularly.          Recent Visits Date Type Provider Dept  06/09/22 Office Visit Delano Metz, MD [facility]  04/06/22 Office Visit Delano Metz, MD [facility]  Showing recent visits within past 90 days and meeting all other requirements Future Appointments Date Type Provider Dept  06/21/22 Appointment Delano Metz, MD [facility]  07/14/22 Appointment Delano Metz, MD [facility]  Showing future appointments within next 90 days and meeting all other requirements  I discussed the assessment and treatment plan with the patient. The patient was provided an opportunity to ask questions and all were answered. The patient agreed with the plan and demonstrated an understanding of the instructions.  Patient advised to call back or seek an in-person evaluation if the symptoms or condition worsens.  Duration of encounter: *** minutes.  Total time on encounter, as per AMA guidelines included both the face-to-face and non-face-to-face time personally spent by the physician and/or other qualified health care professional(s) on the day of the encounter (includes time in activities that require the physician or other qualified health care professional and does not include time in activities normally performed by clinical staff). Physician's time may include the following activities when performed: Preparing to see the patient (e.g., pre-charting review of records, searching for previously ordered imaging, lab work, and nerve conduction tests) Review of prior analgesic pharmacotherapies. Reviewing PMP Interpreting ordered tests (e.g., lab work, imaging, nerve conduction tests) Performing post-procedure evaluations, including interpretation of diagnostic procedures Obtaining and/or reviewing separately obtained history Performing a medically appropriate examination and/or evaluation Counseling and educating the patient/family/caregiver Ordering medications, tests, or procedures Referring and communicating with other health care professionals (when not separately reported) Documenting clinical information in the electronic or  other health record Independently interpreting results (not separately reported) and communicating results to the patient/ family/caregiver Care coordination (not separately reported)  Note by: Oswaldo Done, MD Date: 06/21/2022; Time: 9:11 PM

## 2022-06-21 ENCOUNTER — Ambulatory Visit: Payer: Medicare HMO | Attending: Pain Medicine | Admitting: Pain Medicine

## 2022-06-21 ENCOUNTER — Encounter: Payer: Self-pay | Admitting: Pain Medicine

## 2022-06-21 VITALS — BP 153/82 | HR 78 | Temp 97.9°F | Resp 18 | Ht 62.0 in | Wt 145.0 lb

## 2022-06-21 DIAGNOSIS — Z4542 Encounter for adjustment and management of neuropacemaker (brain) (peripheral nerve) (spinal cord): Secondary | ICD-10-CM

## 2022-06-21 DIAGNOSIS — G8929 Other chronic pain: Secondary | ICD-10-CM

## 2022-06-21 DIAGNOSIS — M25512 Pain in left shoulder: Secondary | ICD-10-CM | POA: Diagnosis present

## 2022-06-21 DIAGNOSIS — M545 Low back pain, unspecified: Secondary | ICD-10-CM | POA: Insufficient documentation

## 2022-06-21 DIAGNOSIS — M542 Cervicalgia: Secondary | ICD-10-CM | POA: Diagnosis present

## 2022-06-21 DIAGNOSIS — M25511 Pain in right shoulder: Secondary | ICD-10-CM | POA: Diagnosis present

## 2022-06-21 DIAGNOSIS — G4486 Cervicogenic headache: Secondary | ICD-10-CM

## 2022-06-21 NOTE — Progress Notes (Signed)
Safety precautions to be maintained throughout the outpatient stay will include: orient to surroundings, keep bed in low position, maintain call bell within reach at all times, provide assistance with transfer out of bed and ambulation.  

## 2022-06-22 ENCOUNTER — Ambulatory Visit: Admission: RE | Admit: 2022-06-22 | Payer: Medicare HMO | Source: Ambulatory Visit

## 2022-07-07 ENCOUNTER — Ambulatory Visit: Payer: Medicare HMO | Admitting: Pain Medicine

## 2022-07-07 NOTE — H&P (View-Only) (Signed)
Referring Physician:  Delano Metz, MD [address] [place],  [place] [address]  Primary Physician:  [facility]  History of Present Illness: 07/08/2022 Alyssa Wood is here today with a chief complaint of neck pain with a prior spinal cord stimulator with her battery at end-of-life.  She has had good function over the past several years.  She has a known fracture in her leads since 2018, but had significant improvement after that as well until her battery died.   Alyssa Wood has no symptoms of cervical myelopathy.  The symptoms are causing a significant impact on the patient's life.   I have utilized the care everywhere function in epic to review the outside records available from external health systems.  Review of Systems:  A 10 point review of systems is negative, except for the pertinent positives and negatives detailed in the HPI.  Past Medical History: Past Medical History:  Diagnosis Date   Anxiety    Chronic pain    Depression    Diabetes mellitus without complication (HCC)    GERD (gastroesophageal reflux disease)    Hypertension     Past Surgical History: Past Surgical History:  Procedure Laterality Date   CHOLECYSTECTOMY  1992   COLONOSCOPY WITH PROPOFOL N/A 08/28/2015   Procedure: COLONOSCOPY WITH PROPOFOL;  Surgeon: Midge Minium, MD;  Location: [facility];  Service: Endoscopy;  Laterality: N/A;  PER PT PLEASE LEAVE LAST NEEDS INTERPRETER   COLONOSCOPY WITH PROPOFOL N/A 02/15/2021   Procedure: COLONOSCOPY WITH PROPOFOL;  Surgeon: Wyline Mood, MD;  Location: [facility];  Service: Gastroenterology;  Laterality: N/A;   ESOPHAGOGASTRODUODENOSCOPY (EGD) WITH PROPOFOL N/A 02/13/2021   Procedure: ESOPHAGOGASTRODUODENOSCOPY (EGD) WITH PROPOFOL;  Surgeon: Jaynie Collins, DO;  Location: [facility] ENDOSCOPY;  Service: Gastroenterology;  Laterality: N/A;   GIVENS CAPSULE STUDY N/A  03/02/2021   Procedure: GIVENS CAPSULE STUDY;  Surgeon: Wyline Mood, MD;  Location: [facility] ENDOSCOPY;  Service: Gastroenterology;  Laterality: N/A;   POLYPECTOMY  08/28/2015   Procedure: POLYPECTOMY;  Surgeon: Midge Minium, MD;  Location: [facility];  Service: Endoscopy;;   SHOULDER SURGERY Right    SPINAL CORD STIMULATOR IMPLANT Right Jan 2013   WRIST SURGERY Right    right hand and wrist reconstruction    Allergies: Allergies as of 07/08/2022 - Review Complete 07/08/2022  Allergen Reaction Noted   Trazodone and nefazodone Anaphylaxis 08/19/2015   Hydrocodone Nausea And Vomiting 08/19/2015    Medications:  Current Outpatient Medications:    Cyanocobalamin (VITAMIN B 12 PO), Take 1 tablet by mouth daily., Disp: , Rfl:    losartan-hydrochlorothiazide (HYZAAR) 50-12.5 MG tablet, Take 1 tablet by mouth daily., Disp: , Rfl:    metFORMIN (GLUCOPHAGE) 500 MG tablet, Take 500 mg by mouth 2 (two) times daily with a meal., Disp: , Rfl:    Omega-3 Fatty Acids (FISH OIL) 1200 MG CAPS, Take by mouth., Disp: , Rfl:    omeprazole (PRILOSEC) 40 MG capsule, Take 1 capsule (40 mg total) by mouth in the morning and at bedtime., Disp: 180 capsule, Rfl: 3   rosuvastatin (CRESTOR) 10 MG tablet, Take 10 mg by mouth at bedtime., Disp: , Rfl:    sertraline (ZOLOFT) 50 MG tablet, Take 50 mg by mouth daily., Disp: , Rfl:    traMADol (ULTRAM) 50 MG tablet, Take 50 mg by mouth daily., Disp: , Rfl:   Social History: Social History   Tobacco Use   Smoking status: Never  Smokeless tobacco: Never  Vaping Use   Vaping Use: Never used  Substance Use Topics   Alcohol use: No   Drug use: No    Family Medical History: Family History  Problem Relation Age of Onset   Cancer Father    Heart disease Father    Breast cancer Neg Hx     Physical Examination: Vitals:   07/08/22 0856  BP: 133/72    General: Patient is in no apparent distress. Attention to examination is appropriate.  Neck:   Supple.   Full range of motion.  Respiratory: Patient is breathing without any difficulty.   NEUROLOGICAL:     Awake, alert, oriented to person, place, and time.  Speech is clear and fluent.   Cranial Nerves: Pupils equal round and reactive to light.  Facial tone is symmetric.  Facial sensation is symmetric. Shoulder shrug is symmetric. Tongue protrusion is midline.  There is no pronator drift.  Strength: MAEW 5/5  Hoffman's is absent.   No evidence of dysmetria noted.  Gait is normal.     Medical Decision Making  Imaging: I reviewed her imaging back to 2012.  I am able to see a fractured lead at the top of the cervical spine on a CT scan from November 2018 so it is been fractured from at least that time  I have personally reviewed the images and agree with the above interpretation.  Assessment and Plan: Alyssa Wood is a pleasant 62 y.o. female with battery at end-of-life for her spinal cord stimulator.  It has been functional since 2013 when placed until the battery ran out.  At this point, I think it is reasonable to replace her battery.  We fully discussed the risk that the lead may not provide the same level of relief and require additional revision.  If that were to occur, I would have to refer her out for that.  She expressed understanding of this and will still like to move forward with replacement of her battery.  I discussed the planned procedure at length with the patient, including the risks, benefits, alternatives, and indications. The risks discussed include but are not limited to bleeding, infection, need for reoperation, spinal fluid leak, stroke, vision loss, anesthetic complication, coma, paralysis, and even death. I also described in detail that improvement was not guaranteed.  The patient expressed understanding of these risks, and asked that we proceed with surgery. I described the surgery in layman's terms, and gave ample opportunity for questions, which were  answered to the best of my ability.   Thank you for involving me in the care of this patient.      Zaccheus Edmister K. Myer Haff MD, [facility]

## 2022-07-07 NOTE — Progress Notes (Unsigned)
Referring Physician:  Delano Metz, MD [address] [place],  [place] [address]  Primary Physician:  [facility]  History of Present Illness: 07/08/2022 Alyssa Wood is here today with a chief complaint of neck pain with a prior spinal cord stimulator with her battery at end-of-life.  She has had good function over the past several years.  She has a known fracture in her leads since 2018, but had significant improvement after that as well until her battery died.   Alyssa Wood has no symptoms of cervical myelopathy.  The symptoms are causing a significant impact on the patient's life.   I have utilized the care everywhere function in epic to review the outside records available from external health systems.  Review of Systems:  A 10 point review of systems is negative, except for the pertinent positives and negatives detailed in the HPI.  Past Medical History: Past Medical History:  Diagnosis Date   Anxiety    Chronic pain    Depression    Diabetes mellitus without complication (HCC)    GERD (gastroesophageal reflux disease)    Hypertension     Past Surgical History: Past Surgical History:  Procedure Laterality Date   CHOLECYSTECTOMY  1992   COLONOSCOPY WITH PROPOFOL N/A 08/28/2015   Procedure: COLONOSCOPY WITH PROPOFOL;  Surgeon: Midge Minium, MD;  Location: [facility];  Service: Endoscopy;  Laterality: N/A;  PER PT PLEASE LEAVE LAST NEEDS INTERPRETER   COLONOSCOPY WITH PROPOFOL N/A 02/15/2021   Procedure: COLONOSCOPY WITH PROPOFOL;  Surgeon: Wyline Mood, MD;  Location: [facility] ENDOSCOPY;  Service: Gastroenterology;  Laterality: N/A;   ESOPHAGOGASTRODUODENOSCOPY (EGD) WITH PROPOFOL N/A 02/13/2021   Procedure: ESOPHAGOGASTRODUODENOSCOPY (EGD) WITH PROPOFOL;  Surgeon: Jaynie Collins, DO;  Location: [facility] ENDOSCOPY;  Service: Gastroenterology;  Laterality: N/A;   GIVENS CAPSULE STUDY N/A  03/02/2021   Procedure: GIVENS CAPSULE STUDY;  Surgeon: Wyline Mood, MD;  Location: [facility] ENDOSCOPY;  Service: Gastroenterology;  Laterality: N/A;   POLYPECTOMY  08/28/2015   Procedure: POLYPECTOMY;  Surgeon: Midge Minium, MD;  Location: [facility];  Service: Endoscopy;;   SHOULDER SURGERY Right    SPINAL CORD STIMULATOR IMPLANT Right Jan 2013   WRIST SURGERY Right    right hand and wrist reconstruction    Allergies: Allergies as of 07/08/2022 - Review Complete 07/08/2022  Allergen Reaction Noted   Trazodone and nefazodone Anaphylaxis 08/19/2015   Hydrocodone Nausea And Vomiting 08/19/2015    Medications:  Current Outpatient Medications:    Cyanocobalamin (VITAMIN B 12 PO), Take 1 tablet by mouth daily., Disp: , Rfl:    losartan-hydrochlorothiazide (HYZAAR) 50-12.5 MG tablet, Take 1 tablet by mouth daily., Disp: , Rfl:    metFORMIN (GLUCOPHAGE) 500 MG tablet, Take 500 mg by mouth 2 (two) times daily with a meal., Disp: , Rfl:    Omega-3 Fatty Acids (FISH OIL) 1200 MG CAPS, Take by mouth., Disp: , Rfl:    omeprazole (PRILOSEC) 40 MG capsule, Take 1 capsule (40 mg total) by mouth in the morning and at bedtime., Disp: 180 capsule, Rfl: 3   rosuvastatin (CRESTOR) 10 MG tablet, Take 10 mg by mouth at bedtime., Disp: , Rfl:    sertraline (ZOLOFT) 50 MG tablet, Take 50 mg by mouth daily., Disp: , Rfl:    traMADol (ULTRAM) 50 MG tablet, Take 50 mg by mouth daily., Disp: , Rfl:   Social History: Social History   Tobacco Use   Smoking status: Never  Smokeless tobacco: Never  Vaping Use   Vaping Use: Never used  Substance Use Topics   Alcohol use: No   Drug use: No    Family Medical History: Family History  Problem Relation Age of Onset   Cancer Father    Heart disease Father    Breast cancer Neg Hx     Physical Examination: Vitals:   07/08/22 0856  BP: 133/72    General: Patient is in no apparent distress. Attention to examination is appropriate.  Neck:   Supple.   Full range of motion.  Respiratory: Patient is breathing without any difficulty.   NEUROLOGICAL:     Awake, alert, oriented to person, place, and time.  Speech is clear and fluent.   Cranial Nerves: Pupils equal round and reactive to light.  Facial tone is symmetric.  Facial sensation is symmetric. Shoulder shrug is symmetric. Tongue protrusion is midline.  There is no pronator drift.  Strength: MAEW 5/5  Hoffman's is absent.   No evidence of dysmetria noted.  Gait is normal.     Medical Decision Making  Imaging: I reviewed her imaging back to 2012.  I am able to see a fractured lead at the top of the cervical spine on a CT scan from November 2018 so it is been fractured from at least that time  I have personally reviewed the images and agree with the above interpretation.  Assessment and Plan: [name] is a pleasant 62 y.o. female with battery at end-of-life for her spinal cord stimulator.  It has been functional since 2013 when placed until the battery ran out.  At this point, I think it is reasonable to replace her battery.  We fully discussed the risk that the lead may not provide the same level of relief and require additional revision.  If that were to occur, I would have to refer her out for that.  She expressed understanding of this and will still like to move forward with replacement of her battery.  I discussed the planned procedure at length with the patient, including the risks, benefits, alternatives, and indications. The risks discussed include but are not limited to bleeding, infection, need for reoperation, spinal fluid leak, stroke, vision loss, anesthetic complication, coma, paralysis, and even death. I also described in detail that improvement was not guaranteed.  The patient expressed understanding of these risks, and asked that we proceed with surgery. I described the surgery in layman's terms, and gave ample opportunity for questions, which were  answered to the best of my ability.   Thank you for involving me in the care of this patient.      Mataeo Ingwersen K. Myer Haff MD, [facility] Neurosurgery

## 2022-07-08 ENCOUNTER — Ambulatory Visit (INDEPENDENT_AMBULATORY_CARE_PROVIDER_SITE_OTHER): Payer: Medicare HMO | Admitting: Neurosurgery

## 2022-07-08 ENCOUNTER — Encounter: Payer: Self-pay | Admitting: Neurosurgery

## 2022-07-08 VITALS — BP 133/72 | Ht 60.0 in | Wt 154.4 lb

## 2022-07-08 DIAGNOSIS — Z4542 Encounter for adjustment and management of neuropacemaker (brain) (peripheral nerve) (spinal cord): Secondary | ICD-10-CM | POA: Diagnosis not present

## 2022-07-08 NOTE — Patient Instructions (Addendum)
Por favor vea la siguiente informacin en relacin a su ciruga planeada:  Ciruga planeada: Reemplazo de la bateria para el estimulador de la espina dorsal (Medtronic)   Fecha de la ciruga: (llmenos cuando est lista) - usted averiguar la hora de llegada el da hbil antes de la [place].  El doctor opera los lunes y [place], y algunos viernes. Algunas opciones posibles: 6/12, 6/17, 6/19. El seguro se tarda hasta 2 semanas para aprobrarla. Por favor avsenos en cuanto le sea posible para que tengamos tiempo suficiente para obtener la autorizacin.   Cita preoperatoria en el departamento de Pre-Admit Testing en  Regional: Le llamaremos con la fecha/hora para esto. El departamento de Pre-Admit Testing (exmenes previos a la admisin) se Consulting civil engineer piso del edificio [facility], [id number]. Por favor traiga todas sus medicinas recetadas en sus frascos originales a esta cita, incluso si ya ha repasado los medicamentos por telfono con un representante de la farmacia. Pueden hacerle pruebas de laboratorio preoperatorias en la cita preoperatoria. No se requiere que usted ayune para [employer]. Si necesita cambiar su cita preoperatoria, por favor llame al departamento de Pre-Admit Testing al [phone].    Metformin: suspndala por 2 das antes de la [place].    Cmo comunicarse con nosotros: Si usted tiene alguna pregunta/inquietud antes o despus de la ciruga, nos puede contactar al [phone], o nos puede mandar un mensaje por el MyChart. Nos puede contactar por telfono o MyChart de las 8am-4pm, de lunes a viernes. Por favor, tome en cuenta: despus de las 4pm, las llamadas se derivan a un servicio contestador de una compaa tercero. Los mensajes por MyChart no estn rutinariamente [employer], fines de [place] y [place]. Por favor llame a nuestra oficina para contactar el servicio contestador para preocupaciones no  urgentes despus del horario de la oficina.      Citas/papeles de FMLA o Discapacidad: Patty [employer] Cristin  EnfermeraRoyston Cowper  Asistentes mdicas: Laurann Montana Asociadas mdicas: Manning Charity & Drake Leach Cirujano: Venetia Night, MD

## 2022-07-13 ENCOUNTER — Telehealth: Payer: Self-pay | Admitting: Neurosurgery

## 2022-07-13 ENCOUNTER — Other Ambulatory Visit: Payer: Self-pay

## 2022-07-13 DIAGNOSIS — Z01818 Encounter for other preprocedural examination: Secondary | ICD-10-CM

## 2022-07-13 NOTE — Telephone Encounter (Signed)
Patient calling back, she would like to proceed with surgery for battery replacement on 07/26/2022. Her post-op is scheduled for 08/09/2022 with Drake Leach.

## 2022-07-13 NOTE — Telephone Encounter (Signed)
Noted. Case posted for 07/26/22. Dr Myer Haff aware.

## 2022-07-14 ENCOUNTER — Ambulatory Visit: Payer: Medicare HMO | Admitting: Pain Medicine

## 2022-07-20 ENCOUNTER — Other Ambulatory Visit: Payer: Self-pay

## 2022-07-20 ENCOUNTER — Encounter
Admission: RE | Admit: 2022-07-20 | Discharge: 2022-07-20 | Disposition: A | Discharge status: Home / Self Care | Payer: Medicare HMO | Source: Ambulatory Visit | Attending: Neurosurgery | Admitting: Neurosurgery

## 2022-07-20 VITALS — BP 148/76 | HR 75 | Resp 14 | Ht 62.0 in | Wt 156.0 lb

## 2022-07-20 DIAGNOSIS — E781 Pure hyperglyceridemia: Secondary | ICD-10-CM | POA: Insufficient documentation

## 2022-07-20 DIAGNOSIS — E871 Hypo-osmolality and hyponatremia: Secondary | ICD-10-CM

## 2022-07-20 DIAGNOSIS — N179 Acute kidney failure, unspecified: Secondary | ICD-10-CM

## 2022-07-20 DIAGNOSIS — Z4542 Encounter for adjustment and management of neuropacemaker (brain) (peripheral nerve) (spinal cord): Secondary | ICD-10-CM | POA: Diagnosis not present

## 2022-07-20 DIAGNOSIS — I10 Essential (primary) hypertension: Secondary | ICD-10-CM

## 2022-07-20 DIAGNOSIS — E119 Type 2 diabetes mellitus without complications: Secondary | ICD-10-CM | POA: Diagnosis not present

## 2022-07-20 DIAGNOSIS — D649 Anemia, unspecified: Secondary | ICD-10-CM | POA: Diagnosis not present

## 2022-07-20 DIAGNOSIS — Z01818 Encounter for other preprocedural examination: Secondary | ICD-10-CM | POA: Insufficient documentation

## 2022-07-20 DIAGNOSIS — Z01812 Encounter for preprocedural laboratory examination: Secondary | ICD-10-CM

## 2022-07-20 LAB — BASIC METABOLIC PANEL
Anion gap: 10 (ref 5–15)
BUN: 14 mg/dL (ref 8–23)
CO2: 24 mmol/L (ref 22–32)
Calcium: 9.4 mg/dL (ref 8.9–10.3)
Chloride: 100 mmol/L (ref 98–111)
Creatinine, Ser: 0.86 mg/dL (ref 0.44–1.00)
GFR, Estimated: >60 mL/min (ref >60)
Glucose, Bld: 78 mg/dL (ref 70–99)
Potassium: 3.5 mmol/L (ref 3.5–5.1)
Sodium: 134 mmol/L — ABNORMAL LOW (ref 135–145)

## 2022-07-20 LAB — SURGICAL PCR SCREEN
MRSA, PCR: NEGATIVE
Staphylococcus aureus: POSITIVE — AB

## 2022-07-20 LAB — CBC
HCT: 35.3 % — ABNORMAL LOW (ref 36.0–46.0)
Hemoglobin: 11.5 g/dL — ABNORMAL LOW (ref 12.0–15.0)
MCH: 27.5 pg (ref 26.0–34.0)
MCHC: 32.6 g/dL (ref 30.0–36.0)
MCV: 84.4 fL (ref 80.0–100.0)
Platelets: 251 10*3/uL (ref 150–400)
RBC: 4.18 MIL/uL (ref 3.87–5.11)
RDW: 13.7 % (ref 11.5–15.5)
WBC: 7.1 10*3/uL (ref 4.0–10.5)
nRBC: 0 % (ref 0.0–0.2)

## 2022-07-20 LAB — HEMOGLOBIN A1C
Hgb A1c MFr Bld: 6.3 % — ABNORMAL HIGH (ref 4.8–5.6)
Mean Plasma Glucose: 134.11 mg/dL

## 2022-07-20 LAB — TYPE AND SCREEN
ABO/RH(D): O POS
Antibody Screen: NEGATIVE

## 2022-07-20 NOTE — Patient Instructions (Signed)
Su procedimiento est programado para: Lunes 17de Junio del 2024. Presntese en el mostrador de Tax adviser del [employer] Para saber su hora de llegada, llame al [phone] entre la 1:00 p. m. y las 3:00 p. m. en: Viernes 14, de Junio del 2024. Si su hora de llegada es a las 6:00 am, no llegue antes de esa hora ya que las puertas de [place] del [facility] no se abren Teacher, adult education las 6:00 am.  RECORDAR: Las instrucciones que no se siguen completamente pueden provocar riesgos mdicos graves, que pueden llegar hasta la [place]; o, segn el criterio de su cirujano y Scientific laboratory technician, es posible que sea Aeronautical engineer su Leisure centre manager.  No ingiera alimentos despus de la medianoche del da anterior a la ciruga. No mascar chicle ni caramelos duros.  Sin embargo, puede beber lquidos Delphi 2 horas antes de la fecha prevista de llegada a la [place]. No beba nada dentro de las 2 horas anteriores a su hora de Nurse, learning disability.  Los lquidos claros incluyen: - agua   Una semana antes de la ciruga: Detenga los antiinflamatorios (AINE) como Advil, Aleve, Ibuprofeno, Motrin, Naproxen, Naprosyn y productos a base de aspirina como Excedrin, Goody's Powder, BC Powder. Suspenda CUALQUIER suplemento de venta libre hasta despus de la [place]. Omega-3 Fatty Acids (FISH OIL) 1200 MG  Cyanocobalamin (VITAMIN B 12  Sin embargo, puede continuar tomando Tylenol si es necesario para Marketing executive de la [place]. Contine tomando todos los Express Scripts, excepto los siguientes: Pare de tomar metFORMIN (GLUCOPHAGE) 500 MG 2 dias antes de la cirugia (Tome la ultima dosis 07/23/22)  Siga las recomendaciones del cardilogo o PCP con respecto a suspender los anticoagulantes.  TOME SLO ESTOS MEDICAMENTOS LA MAANA DE LA CIRUGA CON UN SORBO DE AGUA:  omeprazole (PRILOSEC) 40 MG Anticido (tome uno la noche anterior y otro la maana de la ciruga; [place] a prevenir las nuseas  despus de la ciruga). sertraline (ZOLOFT) 50 MG  traMADol (ULTRAM) 50 MG (si la necesita)  No consumir alcohol durante 24 horas antes o despus de la [place].  No fumar, incluidos los cigarrillos electrnicos, durante las 24 horas previas a la [place]. No consumir productos de tabaco masticables durante al menos 6 horas antes de la [place]. Sin parches de Optometrist de la [place].  No use ningn medicamento "recreativo" durante al menos una semana (preferiblemente 2 semanas) antes de la ciruga. Tenga en cuenta que la combinacin de cocana y anestesia puede [employer], que pueden llegar hasta la [place]. Si su prueba de cocana da positivo, su ciruga ser cancelada.  La maana de la ciruga cepille sus dientes con pasta dental y agua, puede enjuagarse la boca con enjuague bucal si lo desea. No ingiera pasta de dientes ni enjuague bucal.  Utilice jabn o toallitas CHG como se indica en la hoja de instrucciones.  No use joyas, maquillaje, horquillas, clips ni esmalte de uas.  No use lociones, polvos ni perfumes.  No se afeite el vello corporal desde el cuello hacia abajo 48 horas antes de la [place].  No se pueden usar lentes de contacto, audfonos ni dentaduras postizas durante la [place].  No lleve objetos de valor al hospital. [facility] no es responsable de ninguna pertenencia u objeto de valor perdido o perdido.  Notifique a su mdico si hay algn cambio en su condicin mdica (resfriado, fiebre, infeccin).  Lleve ropa cmoda (especfica para su tipo de [place]) al hospital.  Despus de la [place], usted puede ayudar a prevenir complicaciones pulmonares haciendo ejercicios de respiracin. Respire profundamente y tosa cada 1 o 2 horas. Su mdico puede indicarle un dispositivo llamado espirmetro incentivador para ayudarle a respirar profundamente. Al toser o Engineering geologist, sostenga firmemente una almohada contra la incisin con ambas manos. Esto se llama  "ferulizacin". Hacer esto ayuda a proteger su incisin. Tambin disminuye las molestias abdominales.  Si vas a pasar la noche en el hospital, deja tu maleta en el coche. Despus de la [place], es posible que lo lleven a su habitacin.  En caso de un mayor censo de [place], puede ser necesario que usted, el [place], contine con su atencin posoperatoria en el departamento de [place] el [place]ismo Da.  Si le dan el alta el da de la [place], no se le permitir conducir a casa. Necesitar que una persona responsable lo lleve a su casa y se quede con usted durante las 24 horas posteriores a la [place].  Si viaja en transporte pblico, deber ir acompaado de una persona responsable.  Llame al Departamento de pruebas previas a la admisin al [phone] si tiene alguna pregunta sobre estas instrucciones.  Poltica de visitas a ciruga:  [employer] se someten a [place] o procedimiento pueden Delphi familiares o personas de apoyo con ellos, siempre y cuando la persona no sea positiva para COVID-19 ni experimente sus sntomas.  Visitas para pacientes hospitalizados:  El horario de visita es de 7 a 20 horas. Se permiten hasta cuatro visitantes a la vez en la habitacin de un paciente. Los visitantes podrn rotar con Garment/textile technologist. Neomia Dear persona de apoyo designada (adulto) podr pasar la noche.  Debido a un aumento en las tasas de VSR e influenza y las hospitalizaciones asociadas, los nios menores de 12 aos no podrn visitar a los pacientes en los hospitales de [employer]. Se siguen recomendando encarecidamente las mascarillas.  Preparacin para la ciruga con jabn de GLUCONATO DE CLORHEXIDINA (CHG)  Jabn de gluconato de clorhexidina (CHG)  o Un limpiador antisptico que [employer] grmenes y se adhiere a la piel para seguir [employer] grmenes incluso despus del lavado.  o Se utiliza para ducharse la noche anterior a la [place] y la maana de la  [place].  Antes de la [place], usted puede desempear un papel importante al reducir la cantidad de grmenes en su piel. El jabn CHG (gluconato de clorhexidina) es un limpiador antisptico que mata los grmenes y se adhiere a la piel para continuar matndolos incluso despus del lavado.  No lo utilice si es alrgico al CHG o a los jabones antibacterianos. Si su piel se enrojece o irrita, deje de usar CHG.  1. Ducharse la NOCHE ANTES DE LA CIRUGA y la [place] DE LA CIRUGA con jabn CHG.  2. Si eliges lavarte el cabello, lvalo primero como de costumbre con tu champ habitual.  3. Despus del champ, enjuague bien el cabello y el cuerpo para eliminar el champ.  4. Utilice CHG como lo hara con cualquier otro jabn lquido. Puede aplicar CHG directamente sobre la piel y lavar suavemente con un pauelo o una toallita limpia.  5. Aplique el jabn CHG en su cuerpo nicamente desde el cuello hacia abajo. No utilizar en heridas abiertas o llagas abiertas. Evite el contacto con los ojos, odos, boca y genitales (partes privadas). Lvese la cara y los genitales (partes privadas) con su jabn habitual.  6. Lvese bien, prestando especial atencin al rea  donde se realizar su ciruga.  7. Enjuague bien su cuerpo con agua tibia.  8. No se duche ni se lave con su jabn normal despus de usar y enjuagar el jabn CHG.  9. Squese dando palmaditas con una toalla limpia.  10. Use pijamas limpios para dormir la noche anterior a la ciruga.  12. Coloque sbanas limpias en su cama la noche de su primera ducha y no duerma con mascotas.  13. Ducharse nuevamente con el jabn CHG el da de la ciruga antes de llegar al hospital.  14. No aplique desodorantes, lociones o polvos.  15. Por favor use ropa limpia al hospital.  Pre-operative 5 CHG Bath Instructions   You can play a key role in reducing the risk of infection after surgery. Your skin needs to be as free of germs as possible. You can reduce  the number of germs on your skin by washing with CHG (chlorhexidine gluconate) soap before surgery. CHG is an antiseptic soap that kills germs and continues to kill germs even after washing.   DO NOT use if you have an allergy to chlorhexidine/CHG or antibacterial soaps. If your skin becomes reddened or irritated, stop using the CHG and notify one of our RNs at [phone].   Please shower with the CHG soap starting 4 days before surgery using the following schedule:     Please keep in mind the following:  DO NOT shave, including legs and underarms, starting the day of your first shower.   You may shave your face at any point before/day of surgery.  Place clean sheets on your bed the day you start using CHG soap. Use a clean washcloth (not used since being washed) for each shower. DO NOT sleep with pets once you start using the CHG.   CHG Shower Instructions:  If you choose to wash your hair and private area, wash first with your normal shampoo/soap.  After you use shampoo/soap, rinse your hair and body thoroughly to remove shampoo/soap residue.  Turn the water OFF and apply about 3 tablespoons (45 ml) of CHG soap to a CLEAN washcloth.  Apply CHG soap ONLY FROM YOUR NECK DOWN TO YOUR TOES (washing for 3-5 minutes)  DO NOT use CHG soap on face, private areas, open wounds, or sores.  Pay special attention to the area where your surgery is being performed.  If you are having back surgery, having someone wash your back for you may be helpful. Wait 2 minutes after CHG soap is applied, then you may rinse off the CHG soap.  Pat dry with a clean towel  Put on clean clothes/pajamas   If you choose to wear lotion, please use ONLY the CHG-compatible lotions on the back of this paper.     Additional instructions for the day of surgery: DO NOT APPLY any lotions, deodorants, cologne, or perfumes.   Put on clean/comfortable clothes.  Brush your teeth.  Ask your nurse before applying any  prescription medications to the skin.      CHG Compatible Lotions   Aveeno Moisturizing lotion  Cetaphil Moisturizing Cream  Cetaphil Moisturizing Lotion  Clairol Herbal Essence Moisturizing Lotion, Dry Skin  Clairol Herbal Essence Moisturizing Lotion, Extra Dry Skin  Clairol Herbal Essence Moisturizing Lotion, Normal Skin  Curel Age Defying Therapeutic Moisturizing Lotion with Alpha Hydroxy  Curel Extreme Care Body Lotion  Curel Soothing Hands Moisturizing Hand Lotion  Curel Therapeutic Moisturizing Cream, Fragrance-Free  Curel Therapeutic Moisturizing Lotion, Fragrance-Free  Curel Therapeutic Moisturizing Lotion, Original Formula  Eucerin Daily Replenishing Lotion  Eucerin Dry Skin Therapy Plus Alpha Hydroxy Crme  Eucerin Dry Skin Therapy Plus Alpha Hydroxy Lotion  Eucerin Original Crme  Eucerin Original Lotion  Eucerin Plus Crme Eucerin Plus Lotion  Eucerin TriLipid Replenishing Lotion  Keri Anti-Bacterial Hand Lotion  Keri Deep Conditioning Original Lotion Dry Skin Formula Softly Scented  Keri Deep Conditioning Original Lotion, Fragrance Free Sensitive Skin Formula  Keri Lotion Fast Absorbing Fragrance Free Sensitive Skin Formula  Keri Lotion Fast Absorbing Softly Scented Dry Skin Formula  Keri Original Lotion  Keri Skin Renewal Lotion Keri Silky Smooth Lotion  Keri Silky Smooth Sensitive Skin Lotion  Nivea Body Creamy Conditioning Oil  Nivea Body Extra Enriched Teacher, adult education Moisturizing Lotion Nivea Crme  Nivea Skin Firming Lotion  NutraDerm 30 Skin Lotion  NutraDerm Skin Lotion  NutraDerm Therapeutic Skin Cream  NutraDerm Therapeutic Skin Lotion  ProShield Protective Hand Cream  Provon moisturizing lotion

## 2022-07-25 MED ORDER — ORAL CARE MOUTH RINSE: 15.0000 mL | Freq: Once | OROMUCOSAL | Status: AC

## 2022-07-25 MED ORDER — CEFAZOLIN IN SODIUM CHLORIDE 2-0.9 GM/100ML-% IV SOLN
2.0000 g | Freq: Once | INTRAVENOUS | Status: DC
  Filled 2022-07-25: qty 100

## 2022-07-25 MED ORDER — CEFAZOLIN SODIUM-DEXTROSE 2-4 GM/100ML-% IV SOLN
2.0000 g | INTRAVENOUS | Status: AC
  Administered 2022-07-26: 2 g via INTRAVENOUS

## 2022-07-25 MED ORDER — SODIUM CHLORIDE 0.9 % IV SOLN: INTRAVENOUS | Status: DC

## 2022-07-25 MED ORDER — VANCOMYCIN HCL IN DEXTROSE 1-5 GM/200ML-% IV SOLN
1000.0000 mg | Freq: Once | INTRAVENOUS | Status: AC
  Administered 2022-07-26: 1000 mg via INTRAVENOUS

## 2022-07-25 MED ORDER — CHLORHEXIDINE GLUCONATE 0.12 % MT SOLN
15.0000 mL | Freq: Once | OROMUCOSAL | Status: AC
  Administered 2022-07-26: 15 mL via OROMUCOSAL

## 2022-07-26 ENCOUNTER — Ambulatory Visit: Payer: Medicare HMO | Admitting: Anesthesiology

## 2022-07-26 ENCOUNTER — Other Ambulatory Visit: Payer: Self-pay

## 2022-07-26 ENCOUNTER — Encounter
Admission: RE | Disposition: A | Discharge status: Home / Self Care | Payer: Self-pay | Source: Home / Self Care | Attending: Neurosurgery

## 2022-07-26 ENCOUNTER — Ambulatory Visit: Payer: Medicare HMO | Admitting: Urgent Care

## 2022-07-26 ENCOUNTER — Encounter: Payer: Self-pay | Admitting: Neurosurgery

## 2022-07-26 ENCOUNTER — Ambulatory Visit: Payer: Medicare HMO

## 2022-07-26 ENCOUNTER — Ambulatory Visit
Admission: RE | Admit: 2022-07-26 | Discharge: 2022-07-26 | Disposition: A | Discharge status: Home / Self Care | Payer: Medicare HMO | Attending: Neurosurgery | Admitting: Neurosurgery

## 2022-07-26 DIAGNOSIS — Z4542 Encounter for adjustment and management of neuropacemaker (brain) (peripheral nerve) (spinal cord): Secondary | ICD-10-CM | POA: Insufficient documentation

## 2022-07-26 DIAGNOSIS — I10 Essential (primary) hypertension: Secondary | ICD-10-CM | POA: Diagnosis not present

## 2022-07-26 DIAGNOSIS — Z79899 Other long term (current) drug therapy: Secondary | ICD-10-CM | POA: Diagnosis not present

## 2022-07-26 DIAGNOSIS — Z8249 Family history of ischemic heart disease and other diseases of the circulatory system: Secondary | ICD-10-CM | POA: Diagnosis not present

## 2022-07-26 DIAGNOSIS — Z7984 Long term (current) use of oral hypoglycemic drugs: Secondary | ICD-10-CM | POA: Diagnosis not present

## 2022-07-26 DIAGNOSIS — Z01818 Encounter for other preprocedural examination: Secondary | ICD-10-CM

## 2022-07-26 DIAGNOSIS — K219 Gastro-esophageal reflux disease without esophagitis: Secondary | ICD-10-CM | POA: Insufficient documentation

## 2022-07-26 DIAGNOSIS — E119 Type 2 diabetes mellitus without complications: Secondary | ICD-10-CM | POA: Diagnosis not present

## 2022-07-26 DIAGNOSIS — F418 Other specified anxiety disorders: Secondary | ICD-10-CM | POA: Insufficient documentation

## 2022-07-26 DIAGNOSIS — M1990 Unspecified osteoarthritis, unspecified site: Secondary | ICD-10-CM | POA: Diagnosis not present

## 2022-07-26 HISTORY — PX: SPINAL CORD STIMULATOR BATTERY EXCHANGE: SHX6202

## 2022-07-26 LAB — GLUCOSE, CAPILLARY
Glucose-Capillary: 115 mg/dL — ABNORMAL HIGH (ref 70–99)
Glucose-Capillary: 133 mg/dL — ABNORMAL HIGH (ref 70–99)

## 2022-07-26 SURGERY — SPINAL CORD STIMULATOR BATTERY EXCHANGE
Anesthesia: General | Site: Spine Thoracic

## 2022-07-26 MED ORDER — PROPOFOL 10 MG/ML IV BOLUS
INTRAVENOUS | Status: AC
  Filled 2022-07-26: qty 20

## 2022-07-26 MED ORDER — LIDOCAINE HCL (PF) 2 % IJ SOLN
INTRAMUSCULAR | Status: AC
  Filled 2022-07-26: qty 5

## 2022-07-26 MED ORDER — PROPOFOL 10 MG/ML IV BOLUS
INTRAVENOUS | Status: DC | PRN
  Administered 2022-07-26: 40 mg via INTRAVENOUS

## 2022-07-26 MED ORDER — MIDAZOLAM HCL 2 MG/2ML IJ SOLN
INTRAMUSCULAR | Status: AC
  Filled 2022-07-26: qty 2

## 2022-07-26 MED ORDER — ONDANSETRON HCL 4 MG/2ML IJ SOLN: 4.0000 mg | Freq: Once | INTRAMUSCULAR | Status: DC | PRN

## 2022-07-26 MED ORDER — MIDAZOLAM HCL 2 MG/2ML IJ SOLN
INTRAMUSCULAR | Status: DC | PRN
  Administered 2022-07-26: 2 mg via INTRAVENOUS

## 2022-07-26 MED ORDER — PROPOFOL 10 MG/ML IV BOLUS
INTRAVENOUS | Status: AC
  Filled 2022-07-26: qty 40

## 2022-07-26 MED ORDER — ACETAMINOPHEN 10 MG/ML IV SOLN
INTRAVENOUS | Status: AC
  Filled 2022-07-26: qty 100

## 2022-07-26 MED ORDER — OXYCODONE HCL 5 MG PO TABS: 5.0000 mg | ORAL_TABLET | Freq: Four times a day (QID) | ORAL | 0 refills | Status: AC | PRN

## 2022-07-26 MED ORDER — SUCCINYLCHOLINE CHLORIDE 200 MG/10ML IV SOSY
PREFILLED_SYRINGE | INTRAVENOUS | Status: AC
  Filled 2022-07-26: qty 10

## 2022-07-26 MED ORDER — PROPOFOL 1000 MG/100ML IV EMUL
INTRAVENOUS | Status: AC
  Filled 2022-07-26: qty 100

## 2022-07-26 MED ORDER — PHENYLEPHRINE 80 MCG/ML (10ML) SYRINGE FOR IV PUSH (FOR BLOOD PRESSURE SUPPORT)
PREFILLED_SYRINGE | INTRAVENOUS | Status: DC | PRN
  Administered 2022-07-26 (×3): 80 ug via INTRAVENOUS

## 2022-07-26 MED ORDER — FENTANYL CITRATE (PF) 100 MCG/2ML IJ SOLN: 25.0000 ug | INTRAMUSCULAR | Status: DC | PRN

## 2022-07-26 MED ORDER — PHENYLEPHRINE 80 MCG/ML (10ML) SYRINGE FOR IV PUSH (FOR BLOOD PRESSURE SUPPORT)
PREFILLED_SYRINGE | INTRAVENOUS | Status: AC
  Filled 2022-07-26: qty 10

## 2022-07-26 MED ORDER — KETAMINE HCL 50 MG/5ML IJ SOSY
PREFILLED_SYRINGE | INTRAMUSCULAR | Status: AC
  Filled 2022-07-26: qty 5

## 2022-07-26 MED ORDER — KETAMINE HCL 10 MG/ML IJ SOLN
INTRAMUSCULAR | Status: DC | PRN
  Administered 2022-07-26: 10 mg via INTRAVENOUS

## 2022-07-26 MED ORDER — FENTANYL CITRATE (PF) 100 MCG/2ML IJ SOLN
INTRAMUSCULAR | Status: DC | PRN
  Administered 2022-07-26: 50 ug via INTRAVENOUS

## 2022-07-26 MED ORDER — ACETAMINOPHEN 10 MG/ML IV SOLN
1000.0000 mg | Freq: Once | INTRAVENOUS | Status: DC | PRN
  Administered 2022-07-26: 1000 mg via INTRAVENOUS

## 2022-07-26 MED ORDER — OXYCODONE HCL 5 MG PO TABS
5.0000 mg | ORAL_TABLET | Freq: Once | ORAL | Status: AC | PRN
  Administered 2022-07-26: 5 mg via ORAL

## 2022-07-26 MED ORDER — FENTANYL CITRATE (PF) 100 MCG/2ML IJ SOLN
INTRAMUSCULAR | Status: AC
  Filled 2022-07-26: qty 2

## 2022-07-26 MED ORDER — CEFAZOLIN SODIUM-DEXTROSE 2-4 GM/100ML-% IV SOLN
INTRAVENOUS | Status: AC
  Filled 2022-07-26: qty 100

## 2022-07-26 MED ORDER — PROPOFOL 500 MG/50ML IV EMUL
INTRAVENOUS | Status: DC | PRN
  Administered 2022-07-26: 125 ug/kg/min via INTRAVENOUS

## 2022-07-26 MED ORDER — LIDOCAINE HCL (CARDIAC) PF 100 MG/5ML IV SOSY
PREFILLED_SYRINGE | INTRAVENOUS | Status: DC | PRN
  Administered 2022-07-26: 40 mg via INTRAVENOUS

## 2022-07-26 MED ORDER — BUPIVACAINE HCL (PF) 0.5 % IJ SOLN
INTRAMUSCULAR | Status: AC
  Filled 2022-07-26: qty 30

## 2022-07-26 MED ORDER — CHLORHEXIDINE GLUCONATE 0.12 % MT SOLN
OROMUCOSAL | Status: AC
  Filled 2022-07-26: qty 15

## 2022-07-26 MED ORDER — OXYCODONE HCL 5 MG/5ML PO SOLN: 5.0000 mg | Freq: Once | ORAL | Status: AC | PRN

## 2022-07-26 MED ORDER — SODIUM CHLORIDE 0.9 % IR SOLN
Status: DC | PRN
  Administered 2022-07-26: 500 mL

## 2022-07-26 MED ORDER — VANCOMYCIN HCL IN DEXTROSE 1-5 GM/200ML-% IV SOLN
INTRAVENOUS | Status: AC
  Filled 2022-07-26: qty 200

## 2022-07-26 MED ORDER — EPINEPHRINE PF 1 MG/ML IJ SOLN
INTRAMUSCULAR | Status: AC
  Filled 2022-07-26: qty 1

## 2022-07-26 MED ORDER — BUPIVACAINE-EPINEPHRINE (PF) 0.5% -1:200000 IJ SOLN
INTRAMUSCULAR | Status: DC | PRN
  Administered 2022-07-26: 30 mL

## 2022-07-26 MED ORDER — OXYCODONE HCL 5 MG PO TABS
ORAL_TABLET | ORAL | Status: AC
  Filled 2022-07-26: qty 1

## 2022-07-26 SURGICAL SUPPLY — 47 items
ACC NRSTM SPNL CORD STM CHRNC (Spinal Cord Stimulator) ×1 IMPLANT
ADH SKN CLS APL DERMABOND .7 (GAUZE/BANDAGES/DRESSINGS) ×2
AGENT HMST KT MTR STRL THRMB (HEMOSTASIS)
BUR NEURO DRILL SOFT 3.0X3.8M (BURR) ×1 IMPLANT
DERMABOND ADVANCED .7 DNX12 (GAUZE/BANDAGES/DRESSINGS) ×2 IMPLANT
DRAPE C ARM PK CFD 31 SPINE (DRAPES) ×1 IMPLANT
DRAPE LAPAROTOMY 100X77 ABD (DRAPES) ×1 IMPLANT
DRSG OPSITE POSTOP 3X4 (GAUZE/BANDAGES/DRESSINGS) IMPLANT
DRSG OPSITE POSTOP 4X6 (GAUZE/BANDAGES/DRESSINGS) IMPLANT
DRSG OPSITE POSTOP 4X8 (GAUZE/BANDAGES/DRESSINGS) IMPLANT
ELECT REM PT RETURN 9FT ADLT (ELECTROSURGICAL) ×1
ELECTRODE REM PT RTRN 9FT ADLT (ELECTROSURGICAL) ×1 IMPLANT
FEE INTRAOP CADWELL SUPPLY NCS (MISCELLANEOUS) IMPLANT
FEE INTRAOP MONITOR IMPULS NCS (MISCELLANEOUS) IMPLANT
GLOVE BIOGEL PI IND STRL 6.5 (GLOVE) ×1 IMPLANT
GLOVE SURG SYN 6.5 ES PF (GLOVE) ×1 IMPLANT
GLOVE SURG SYN 6.5 PF PI (GLOVE) ×1 IMPLANT
GLOVE SURG SYN 8.5 E (GLOVE) ×3 IMPLANT
GLOVE SURG SYN 8.5 PF PI (GLOVE) ×3 IMPLANT
GOWN SRG LRG LVL 4 IMPRV REINF (GOWNS) ×1 IMPLANT
GOWN SRG XL LVL 3 NONREINFORCE (GOWNS) ×1 IMPLANT
GOWN STRL NON-REIN TWL XL LVL3 (GOWNS) ×1
GOWN STRL REIN LRG LVL4 (GOWNS) ×1
GRAFT DURAGEN MATRIX 1WX1L (Tissue) IMPLANT
INTRAOP CADWELL SUPPLY FEE NCS (MISCELLANEOUS)
INTRAOP DISP SUPPLY FEE NCS (MISCELLANEOUS)
INTRAOP MONITOR FEE IMPULS NCS (MISCELLANEOUS)
KIT SPINAL PRONEVIEW (KITS) ×1 IMPLANT
MANIFOLD NEPTUNE II (INSTRUMENTS) ×1 IMPLANT
MARKER SKIN DUAL TIP RULER LAB (MISCELLANEOUS) ×1 IMPLANT
NDL SAFETY ECLIP 18X1.5 (MISCELLANEOUS) ×1 IMPLANT
NS IRRIG 1000ML POUR BTL (IV SOLUTION) ×1 IMPLANT
PACK LAMINECTOMY ARMC (PACKS) ×1 IMPLANT
PASSER CATH SHUNT 55CM (INSTRUMENTS) IMPLANT
RECHARGER VANTA TM NEURO STIM (Spinal Cord Stimulator) IMPLANT
REPROGRAMMER VANTA TM STIM (KITS) IMPLANT
SOLUTION IRRIG SURGIPHOR (IV SOLUTION) ×1 IMPLANT
STAPLER SKIN PROX 35W (STAPLE) ×1 IMPLANT
SURGIFLO W/THROMBIN 8M KIT (HEMOSTASIS) ×1 IMPLANT
SUT DVC VLOC 3-0 CL 6 P-12 (SUTURE) ×1 IMPLANT
SUT SILK 2 0SH CR/8 30 (SUTURE) ×1 IMPLANT
SUT VIC AB 0 CT1 18XCR BRD 8 (SUTURE) ×1 IMPLANT
SUT VIC AB 0 CT1 8-18 (SUTURE) ×2
SUT VIC AB 2-0 CT1 18 (SUTURE) ×1 IMPLANT
SYR 10ML LL (SYRINGE) ×1 IMPLANT
SYR 30ML LL (SYRINGE) ×2 IMPLANT
TRAP FLUID SMOKE EVACUATOR (MISCELLANEOUS) ×1 IMPLANT

## 2022-07-26 NOTE — Op Note (Signed)
Indications: the patient is a 62 yo female who was diagnosed with Z45.42 Battery end of life of spinal cord stimulator .   Findings: successful placement of a new Medtronic internal pulse generator  Preoperative Diagnosis: Z45.42 Battery end of life of spinal cord stimulator  Postoperative Diagnosis: same     EBL: 10 ml IVF: see AR ml Drains: none Disposition: Extubated and Stable to PACU Complications: none   No foley catheter was placed.     Preoperative Note:    Risks of surgery discussed in clinic.   Operative Note:      The patient was then brought from the preoperative center with intravenous access established. The patient was rotated on the [place] table where all pressure points were appropriately padded.    Preoperative antibiotics were given.  A full timeout was performed.  The prior incision inferior to the right armpit for the pulse generator was identified.  X-ray was used to mark out the path of the lead.  The area was then prepped and draped.  The incision over the pulse generator was opened.  The old pulse generator was removed and the lead removed from the pulse generator, which was then handed off.  An incision was then made over the lead in the upper part of the thoracic spine.  The lead was located and removed to this level for retunneling.  An incision on the right flank was then opened for placement of the new pulse generator.  This was opened and a pocket made.  Using a tunneler, the lead was then tunneled to the new pocket.  It was secured to the new battery and impedances checked.  The pulse generator was then placed within the new pocket.  Each incision was irrigated.  Hemostasis was achieved.  I then closed each incision in layers using 0 and 2-0 Vicryl as well as 3-0 Monocryl and Dermabond.  Sterile dressings were applied.  The patient was then handed back to anesthesia for recovery.  I performed the entire procedure.     Venetia Night MD

## 2022-07-26 NOTE — Discharge Summary (Signed)
Physician Discharge Summary  Patient ID: Alyssa Wood MRN: [id number] DOB/AGE: 64/01/1961 64 y.o.  Admit date: 07/26/2022 Discharge date: 07/26/2022  Admission Diagnoses: Battery end of life  Discharge Diagnoses:  Active Problems:   * No active hospital problems. *   Discharged Condition: good  Hospital Course: [name] was brought to the operating room for exchange of her spinal cord stimulator battery.  This was done without incident.  She was recovered in the recovery unit and then discharged home when she met appropriate criteria.  Consults: None  Significant Diagnostic Studies: none  Treatments: surgery: Replacement and repositioning of her pulse generator  Discharge Exam: Blood pressure (!) 148/88, pulse 67, temperature 98.2 F (36.8 C), resp. rate 14, height 5\' 2"  (1.575 m), weight 69.9 kg, SpO2 100 %. General appearance: alert and cooperative CNI MAEW  Disposition: Discharge disposition: 01-Home or Self Care       Discharge Instructions     Discharge patient   Complete by: As directed    Discharge disposition: 01-Home or Self Care   Discharge patient date: 07/26/2022      Allergies as of 07/26/2022       Reactions   Trazodone And Nefazodone Anaphylaxis   Hydrocodone Nausea And Vomiting        Medication List     TAKE these medications    Fish Oil 1200 MG Caps Take by mouth.   losartan-hydrochlorothiazide 50-12.5 MG tablet Commonly known as: HYZAAR Take 1 tablet by mouth daily.   metFORMIN 500 MG tablet Commonly known as: GLUCOPHAGE Take 500 mg by mouth 2 (two) times daily with a meal.   omeprazole 40 MG capsule Commonly known as: PRILOSEC Take 1 capsule (40 mg total) by mouth in the morning and at bedtime.   oxyCODONE 5 MG immediate release tablet Commonly known as: Roxicodone Take 1 tablet (5 mg total) by mouth every 6 (six) hours as needed for up to 5 days for moderate pain.   rosuvastatin 10 MG  tablet Commonly known as: CRESTOR Take 10 mg by mouth at bedtime.   sertraline 50 MG tablet Commonly known as: ZOLOFT Take 50 mg by mouth daily.   sucralfate 1 g tablet Commonly known as: CARAFATE Take 1 g by mouth 2 (two) times daily.   traMADol 50 MG tablet Commonly known as: ULTRAM Take 50 mg by mouth daily.   VITAMIN B 12 PO Take 1 tablet by mouth daily.        Follow-up Information     Drake Leach, PA-C Follow up on 08/09/2022.   Specialty: Neurosurgery Why: at [facility]  information: [address] [place] [address] [phone]                 Signed: Venetia Night 07/26/2022, 9:08 AM

## 2022-07-26 NOTE — Interval H&P Note (Signed)
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  The various methods of treatment have been discussed with the patient and family. After consideration of risks, benefits and other options for treatment, the patient has consented to  Procedure(s): SPINAL CORD STIMULATOR BATTERY EXCHANGE ([employer]TRONIC) (N/A) as a surgical intervention.  The patient's history has been reviewed, patient examined, no change in status, stable for surgery.  I have reviewed the patient's chart and labs.  Questions were answered to the patient's satisfaction.    Heart sounds normal no MRG. Chest Clear to Auscultation Bilaterally.   Jennalyn Cawley

## 2022-07-26 NOTE — Anesthesia Preprocedure Evaluation (Signed)
Anesthesia Evaluation  Patient identified by MRN, date of birth, ID band Patient awake    Reviewed: Allergy & Precautions, NPO status , Patient's Chart, lab work & pertinent test results  History of Anesthesia Complications Negative for: history of anesthetic complications  Airway Mallampati: II  TM Distance: >3 FB Neck ROM: Full    Dental  (+) Partial Upper   Pulmonary neg pulmonary ROS, neg sleep apnea, neg COPD, Patient abstained from smoking.Not current smoker   Pulmonary exam normal breath sounds clear to auscultation       Cardiovascular Exercise Tolerance: Good METShypertension, Pt. on medications (-) CAD and (-) Past MI (-) dysrhythmias  Rhythm:Regular Rate:Normal - Systolic murmurs    Neuro/Psych  Headaches PSYCHIATRIC DISORDERS Anxiety Depression       GI/Hepatic ,GERD  Medicated and Controlled,,(+)     (-) substance abuse    Endo/Other  diabetes    Renal/GU negative Renal ROS     Musculoskeletal  (+) Arthritis ,    Abdominal   Peds  Hematology   Anesthesia Other Findings Past Medical History: No date: Anxiety No date: Chronic pain No date: Depression No date: Diabetes mellitus without complication (HCC) No date: GERD (gastroesophageal reflux disease) No date: Hypertension  Reproductive/Obstetrics                             Anesthesia Physical Anesthesia Plan  ASA: 2  Anesthesia Plan: General   Post-op Pain Management: Ofirmev IV (intra-op)*   Induction: Intravenous  PONV Risk Score and Plan: 3 and Propofol infusion, TIVA, Ondansetron and Midazolam  Airway Management Planned: Nasal Cannula  Additional Equipment: None  Intra-op Plan:   Post-operative Plan:   Informed Consent: I have reviewed the patients History and Physical, chart, labs and discussed the procedure including the risks, benefits and alternatives for the proposed anesthesia with the patient or  authorized representative who has indicated his/her understanding and acceptance.     Dental advisory given  Plan Discussed with: CRNA and Surgeon  Anesthesia Plan Comments: (Discussed risks of anesthesia with patient, including possibility of difficulty with spontaneous ventilation under anesthesia necessitating airway intervention, PONV, and rare risks such as cardiac or respiratory or neurological events, and allergic reactions. Discussed the role of CRNA in patient's perioperative care. Patient understands.)       Anesthesia Quick Evaluation

## 2022-07-26 NOTE — Transfer of Care (Signed)
Immediate Anesthesia Transfer of Care Note  Patient: Alyssa Wood  Procedure(s) Performed: SPINAL CORD STIMULATOR BATTERY EXCHANGE (MEDTRONIC) (Spine Thoracic)  Patient Location: PACU  Anesthesia Type:General  Level of Consciousness: drowsy  Airway & Oxygen Therapy: Patient Spontanous Breathing and Patient connected to nasal cannula oxygen  Post-op Assessment: Report given to RN, Post -op Vital signs reviewed and stable, and Patient moving all extremities  Post vital signs: Reviewed and stable  Last Vitals:  Vitals Value Taken Time  BP 113/56 07/26/22 0835  Temp    Pulse 70 07/26/22 0839  Resp 14 07/26/22 0839  SpO2 100 % 07/26/22 0839  Vitals shown include unvalidated device data.  Last Pain:  Vitals:   07/26/22 0650  TempSrc: Oral  PainSc: 3          Complications: No notable events documented.

## 2022-07-26 NOTE — Discharge Instructions (Addendum)
Your surgeon has performed an operation on your back to change your battery for your stimulator. The following are instructions to help in your recovery once you have been discharged from the hospital.  * It is ok to take NSAIDs after surgery.  Activity    No bending, lifting, or twisting ("BLT"). Avoid lifting objects heavier than 10 pounds (gallon milk jug).  Where possible, avoid household activities that involve lifting, bending, pushing, or pulling such as laundry, vacuuming, grocery shopping, and childcare. Try to arrange for help from friends and family for these activities while your back heals.  Increase physical activity slowly as tolerated.  Taking short walks is encouraged, but avoid strenuous exercise. Do not jog, run, bicycle, lift weights, or participate in any other exercises unless specifically allowed by your doctor. Avoid prolonged sitting, including car rides.  Talk to your doctor before resuming sexual activity.  You should not drive until cleared by your doctor.  Until released by your doctor, you should not return to work or school.  You should rest at home and let your body heal.   You may shower three days after your surgery.  After showering, lightly dab your incision dry. Do not take a tub bath or go swimming for 3 weeks, or until approved by your doctor at your follow-up appointment.  If you smoke, we strongly recommend that you quit.  Smoking has been proven to interfere with normal healing in your back and will dramatically reduce the success rate of your surgery. Please contact [facility] (800-QUIT-NOW) and use the resources at www.[facility].com for assistance in stopping smoking.  Surgical Incision   If you have a dressing on your incision, you may remove it two days after your surgery. Keep your incision area clean and dry.   Diet            You may return to your usual diet. Be sure to stay hydrated.  When to Contact us  Although your surgery and  recovery will likely be uneventful, you may have some residual numbness, aches, and pains in your back and/or legs. This is normal and should improve in the next few weeks.  However, should you experience any of the following, contact us immediately: New numbness or weakness Pain that is progressively getting worse, and is not relieved by your pain medications or rest Bleeding, redness, swelling, pain, or drainage from surgical incision Chills or flu-like symptoms Fever greater than 101.0 F (38.3 C) Problems with bowel or bladder functions Difficulty breathing or shortness of breath Warmth, tenderness, or swelling in your calf  Contact Information How to contact us:  If you have any questions/concerns before or after surgery, you can reach [place] at [phone], or you can send a mychart message. We can be reached by phone or mychart 8am-4pm, Monday-Friday.  *Please note: Calls after 4pm are forwarded to a third party answering service. Mychart messages are not routinely monitored during evenings, weekends, and holidays. Please call our office to contact the answering service for urgent concerns during non-business hours.       AMBULATORY SURGERY  DISCHARGE INSTRUCTIONS   The drugs that you were given will stay in your system until tomorrow so for the next 24 hours you should not:  Drive an automobile Make any legal decisions Drink any alcoholic beverage   You may resume regular meals tomorrow.  Today it is better to start with liquids and gradually work up to solid foods.  You may eat anything you prefer,  but it is better to start with liquids, then soup and crackers, and gradually work up to solid foods.   Please notify your doctor immediately if you have any unusual bleeding, trouble breathing, redness and pain at the surgery site, drainage, fever, or pain not relieved by medication.     Your post-operative visit with Dr.                                       is: Date:                         Time:    Please call to schedule your post-operative visit.  Additional Instructions:

## 2022-07-26 NOTE — Anesthesia Postprocedure Evaluation (Signed)
Anesthesia Post Note  Patient: Alyssa Wood  Procedure(s) Performed: SPINAL CORD STIMULATOR BATTERY EXCHANGE (MEDTRONIC) (Spine Thoracic)  Patient location during evaluation: PACU Anesthesia Type: General Level of consciousness: awake and alert Pain management: pain level controlled Vital Signs Assessment: post-procedure vital signs reviewed and stable Respiratory status: spontaneous breathing, nonlabored ventilation, respiratory function stable and patient connected to nasal cannula oxygen Cardiovascular status: blood pressure returned to baseline and stable Postop Assessment: no apparent nausea or vomiting Anesthetic complications: no   No notable events documented.   Last Vitals:  Vitals:   07/26/22 0900 07/26/22 0929  BP: (!) 148/88 (!) 162/73  Pulse: 67 65  Resp: 14 16  Temp:  (!) 36.1 C  SpO2: 100% 100%    Last Pain:  Vitals:   07/26/22 0929  TempSrc: Temporal  PainSc: 3                  Corinda Gubler

## 2022-07-27 ENCOUNTER — Encounter: Payer: Self-pay | Admitting: Neurosurgery

## 2022-07-27 ENCOUNTER — Telehealth: Payer: Self-pay | Admitting: Neurosurgery

## 2022-07-27 NOTE — Telephone Encounter (Signed)
SCS battery replacement on 07/26/22  Can she take tramadol along with the other medications that Dr.Yarbrough prescribed her yesterday?

## 2022-07-27 NOTE — Telephone Encounter (Signed)
Dr Myer Haff only sent in oxycodone yesterday. The oxycodone is stronger than the tramadol. If the tramadol is sufficient to provide pain relief, she can just take the tramadol. If not, she can take the oxycodone instead. Dr Myer Haff said she can choose which one helps more. If she chooses to take the oxycodone, then she can put the tramadol on hold and resume the tramadol when she stops the oxycodone.

## 2022-07-27 NOTE — Telephone Encounter (Signed)
Patient is aware of Dr.Yarbrough's response. She will only take oxycodone for right now and hold the tramadol until her she is done taking oxycodone.

## 2022-08-08 NOTE — Progress Notes (Unsigned)
   REFERRING PHYSICIAN:  No referring provider defined for this encounter.  DOS: 07/26/22 successful placement of a new [employer]onic internal pulse generator   Interpreter used as she speaks spanish.   HISTORY OF PRESENT ILLNESS: Alyssa Wood is approximately 2 weeks status post successful placement of a new Medtronic internal pulse generator. Was given oxycodone on discharge from the hospital. Was to hold ultram while taking this.   She has only minimal soreness in her back. No new leg pain. She is feeling really good.   Only took 2-3 oxycodone and it made her dizzy. She is back on her chronic ultram.    PHYSICAL EXAMINATION:  General: Patient is well developed, well nourished, calm, collected, and in no apparent distress.   NEUROLOGICAL:  General: In no acute distress.   Awake, alert, oriented to person, place, and time.  Pupils equal round and reactive to light.  Facial tone is symmetric.     Strength:            Side Iliopsoas Quads Hamstring PF DF EHL  R 5 5 5 5 5 5   L 5 5 5 5 5 5    Incisions c/d/i   ROS (Neurologic):  Negative except as noted above  IMAGING: Nothing new to review.   ASSESSMENT/PLAN:  Alyssa Wood is doing well s/p above surgery. Treatment options reviewed with patient and following plan made:   - Reviewed wound care.  - No bending, twisting, or lifting for the next 2-3 weeks.  - Continue on current medications including chronic ultram.  - [employer] rep was in to see her today for some reprogramming.  - She will f/u prn.   Advised to contact the office if any questions or concerns arise.  Drake Leach PA-C Department of neurosurgery

## 2022-08-09 ENCOUNTER — Encounter: Payer: Self-pay | Admitting: Orthopedic Surgery

## 2022-08-09 ENCOUNTER — Ambulatory Visit (INDEPENDENT_AMBULATORY_CARE_PROVIDER_SITE_OTHER): Payer: Medicare HMO | Admitting: Orthopedic Surgery

## 2022-08-09 VITALS — BP 140/80 | Ht 60.0 in | Wt 154.0 lb

## 2022-08-09 DIAGNOSIS — Z4542 Encounter for adjustment and management of neuropacemaker (brain) (peripheral nerve) (spinal cord): Secondary | ICD-10-CM

## 2022-08-09 DIAGNOSIS — Z09 Encounter for follow-up examination after completed treatment for conditions other than malignant neoplasm: Secondary | ICD-10-CM

## 2022-08-17 ENCOUNTER — Encounter: Payer: Self-pay | Admitting: Neurosurgery

## 2022-11-05 NOTE — Progress Notes (Unsigned)
PROVIDER NOTE: Information contained herein reflects review and annotations entered in association with encounter. Interpretation of such information and data should be left to medically-trained personnel. Information provided to patient can be located elsewhere in the medical record under "Patient Instructions". Document created using STT-dictation technology, any transcriptional errors that may result from process are unintentional.    Patient: Alyssa Wood  Service Category: E/M  Provider: Oswaldo Done, MD  DOB: Apr 26, 1960  DOS: 11/10/2022  Referring Provider: Center, Darcella Gasman*  MRN: [id number]  Specialty: Interventional Pain Management  PCP: [facility], [facility]  Type: Established Patient  Setting: Ambulatory outpatient    Location: Office  Delivery: Face-to-face     HPI  [name], a 62 y.o. year old female, is here today because of her No primary diagnosis found.. [name]'s primary complain today is No chief complaint on file.  Pertinent problems: Alyssa Wood has Chronic neck pain (1ry area of Pain) (Bilateral) (R>L); Chronic low back pain (4th area of Pain) (Bilateral) (L>R) w/o sciatica; Chronic upper extremity pain (Bilateral) (R>L); Chronic pain syndrome; Chronic shoulder pain (2ry area of Pain) (Bilateral) (R>L); Cervicogenic headache (3ry area of Pain); Cervical Spinal cord stimulator (Fractured Tip); Osteoarthritis; Chronic musculoskeletal pain; Chronic cervical radiculitis (Bilateral) (R>L); Cervical spondylosis with radiculopathy (Bilateral); Chronic shoulder radicular pain (Bilateral); DDD (degenerative disc disease), cervical; Cervicalgia (1ry area of Pain) (Bilateral) (R>L); Neurogenic pain; Chronic neuropathic pain; Chronic hand pain (Bilateral) (R>L); Chronic trigger finger, middle finger (Right); Cervical myofascial pain syndrome; DDD (degenerative disc disease), lumbar; Low back pain of over 3 months  duration; Chronic upper back pain; Thoracic radiculopathy (Right); Chronic pain of right upper extremity; Radicular pain of right upper extremity; Chronic right shoulder pain; Impaired range of motion of both shoulders; and Battery end of life of spinal cord stimulator on their pertinent problem list. Pain Assessment: Severity of   is reported as a  /10. Location:    / . Onset:  . Quality:  . Timing:  . Modifying factor(s):  [name] Vitals:  vitals were not taken for this visit.  BMI: Estimated body mass index is 30.08 kg/m as calculated from the following:   Height as of 08/09/22: 5' (1.524 m).   Weight as of 08/09/22: 154 lb (69.9 kg). Last encounter: 06/21/2022. Last procedure: 03/09/2022.  Reason for encounter:  *** . ***  Pharmacotherapy Assessment  Analgesic: No chronic opioid analgesics therapy prescribed by our practice. Tramadol 50 mg, 1 tab PO QD (PRN) (50 mg/day of tramadol)  MME/day: 5 mg/day.   Monitoring: [place] PMP: PDMP reviewed during this encounter.       Pharmacotherapy: No side-effects or adverse reactions reported. Compliance: No problems identified. Effectiveness: Clinically acceptable.  No notes on file  No results found for: "CBDTHCR" No results found for: "D8THCCBX" No results found for: "D9THCCBX"  UDS:  Summary  Date Value Ref Range Status  07/03/2019 Note  Final    Comment:    ==================================================================== [employer] Select 13 (MW) ==================================================================== Test                             Result       Flag       Units Drug Present and Declared for Prescription Verification   Tramadol                       >6024        EXPECTED  ng/mg creat   O-Desmethyltramadol            >6024        EXPECTED   ng/mg creat   N-Desmethyltramadol            541          EXPECTED   ng/mg creat    Source of tramadol is a prescription medication. O-desmethyltramadol    and N-desmethyltramadol are  expected metabolites of tramadol. ==================================================================== Test                      Result    Flag   Units      Ref Range   Creatinine              83               mg/dL      >=16 ==================================================================== Declared Medications:  The flagging and interpretation on this report are based on the  following declared medications.  Unexpected results may arise from  inaccuracies in the declared medications.  **Note: The testing scope of this panel includes these medications:  Tramadol (Ultram)  **Note: The testing scope of this panel does not include the  following reported medications:  Calcium  Cyanocobalamin  Gabapentin (Neurontin)  Hydrochlorothiazide (Zestoretic)  Lisinopril (Zestoretic)  Meloxicam (Mobic)  Metformin (Glucophage)  Omega-3 Fatty Acids  Omeprazole (Prilosec)  Sertraline (Zoloft)  Topical  Vitamin D ==================================================================== For clinical consultation, please call [phone]. ====================================================================       ROS  Constitutional: Denies any fever or chills Gastrointestinal: No reported hemesis, hematochezia, vomiting, or acute GI distress Musculoskeletal: Denies any acute onset joint swelling, redness, loss of ROM, or weakness Neurological: No reported episodes of acute onset apraxia, aphasia, dysarthria, agnosia, amnesia, paralysis, loss of coordination, or loss of consciousness  Medication Review  Cyanocobalamin, Fish Oil, losartan-hydrochlorothiazide, metFORMIN, omeprazole, rosuvastatin, sertraline, sucralfate, and traMADol  History Review  Allergy: Alyssa Wood is allergic to trazodone and nefazodone and hydrocodone. Drug: Alyssa Wood  reports no history of drug use. Alcohol:  reports current alcohol use of about 1.0 standard drink of alcohol per week. Tobacco:   reports that she has never smoked. She has never used smokeless tobacco. Social: Alyssa Wood  reports that she has never smoked. She has never used smokeless tobacco. She reports current alcohol use of about 1.0 standard drink of alcohol per week. She reports that she does not use drugs. Medical:  has a past medical history of Anxiety, Chronic pain, Depression, Diabetes mellitus without complication (HCC), GERD (gastroesophageal reflux disease), and Hypertension. Surgical: Alyssa Wood  has a past surgical history that includes Cholecystectomy (1992); Spinal cord stimulator implant (Right, Jan 2013); Shoulder surgery (Right); Wrist surgery (Right); Colonoscopy with propofol (N/A, 08/28/2015); polypectomy (08/28/2015); Esophagogastroduodenoscopy (egd) with propofol (N/A, 02/13/2021); Givens capsule study (N/A, 03/02/2021); Colonoscopy with propofol (N/A, 02/15/2021); and Spinal cord stimulator battery exchange (N/A, 07/26/2022). Family: family history includes Cancer in her father; Heart disease in her father.  Laboratory Chemistry Profile   Renal Lab Results  Component Value Date   BUN 14 07/20/2022   CREATININE 0.86 07/20/2022   BCR 19 01/11/2017   GFRAA >60 08/16/2019   GFRNONAA >60 07/20/2022    Hepatic Lab Results  Component Value Date   AST 32 09/23/2021   ALT 24 09/23/2021   ALBUMIN 4.1 09/23/2021   ALKPHOS 116 09/23/2021   LIPASE 32 02/12/2021    Electrolytes Lab Results  Component Value  Date   NA 134 (L) 07/20/2022   K 3.5 07/20/2022   CL 100 07/20/2022   CALCIUM 9.4 07/20/2022   MG 2.3 01/11/2017    Bone Lab Results  Component Value Date   25OHVITD1 22 (L) 01/11/2017   25OHVITD2 2.5 01/11/2017   25OHVITD3 19 01/11/2017    Inflammation (CRP: Acute Phase) (ESR: Chronic Phase) Lab Results  Component Value Date   CRP 7 02/22/2022   ESRSEDRATE 37 02/22/2022         Note: Above Lab results reviewed.  Recent Imaging Review  DG Thoracic Spine 1  View CLINICAL DATA:  Adjustment of spinal cord stimulator and replacement of battery  EXAM: OPERATIVE THORACIC SPINE 1 VIEW(S)  COMPARISON:  Thoracic spine radiograph done on 06/09/2022  FINDINGS: Fluoroscopic assistance was provided for adjustment of spinal cord stimulator. There is a lead extending to the cervical spinal canal. Fluoroscopic time 2.2 seconds. Radiation dose 0.398 mGy.  IMPRESSION: Fluoroscopic assistance was provided for adjustment of spinal cord stimulator.  Electronically Signed   By: Ernie Avena M.D.   On: 07/26/2022 09:14 DG C-Arm 1-60 Min-No Report Fluoroscopy was utilized by the requesting physician.  No radiographic  interpretation.  Note: Reviewed        Physical Exam  General appearance: Well nourished, well developed, and well hydrated. In no apparent acute distress Mental status: Alert, oriented x 3 (person, place, & time)       Respiratory: No evidence of acute respiratory distress Eyes: PERLA Vitals: There were no vitals taken for this visit. BMI: Estimated body mass index is 30.08 kg/m as calculated from the following:   Height as of 08/09/22: 5' (1.524 m).   Weight as of 08/09/22: 154 lb (69.9 kg). Ideal: Patient weight not recorded  Assessment   Diagnosis Status  No diagnosis found. Controlled Controlled Controlled   Updated Problems: No problems updated.  Plan of Care  Problem-specific:  No problem-specific Assessment & Plan notes found for this encounter.  Alyssa Wood has a current medication list which includes the following long-term medication(s): losartan-hydrochlorothiazide, metformin, omeprazole, rosuvastatin, and sucralfate.  Pharmacotherapy (Medications Ordered): No orders of the defined types were placed in this encounter.  Orders:  No orders of the defined types were placed in this encounter.  Follow-up plan:   No follow-ups on file.      Interventional Therapies  Risk Factors   Considerations:  Anxiety  Depression  GERD  HTN  Hx of upper GI bleed  Fractured SCS lead              Planned  Pending:   Referral to physical therapy to work with hands Referral to hand surgery for possible surgical alternatives to her right hand trigger finger. Diagnostic lab work: Sed rate, CRP We will try to coordinate one of her visits with that of the [employer] representative so that they can "jumpstart" her spinal cord stimulator again.   Under consideration:   Diagnostic bilateral cervical facet MBB #1  Diagnostic/therapeutic right hand, middle trigger finger injection #1  Therapeutic bilateral suprascapular NB #2  Therapeutic bilateral suprascapular nerve RFA #1    Completed:   Diagnostic bilateral suprascapular NB x1 (03/09/2022) (100/100/80/90)  Therapeutic right C7-T1 CESI x2 (08/04/2017) (100/100/100 x 1 day/60/>75)  Diagnostic cervical spinal cord stimulator trial    Completed by other providers:   None at this time   Therapeutic  Palliative (PRN) options:   Palliative right CESI #3   Pharmacotherapy  The patient has been  using the tramadol to control her pain and she had developed an ulcer after having taken NSAIDs.  The patient was recommended to use Prilosec or Nexium on a regular basis.  Doing so and taking an occasional NSAID should be okay as long as she does not take it regularly.        Recent Visits No visits were found meeting these conditions. Showing recent visits within past 90 days and meeting all other requirements Future Appointments Date Type Provider Dept  11/10/22 Appointment Delano Metz, MD [facility]  Showing future appointments within next 90 days and meeting all other requirements  I discussed the assessment and treatment plan with the patient. The patient was provided an opportunity to ask questions and all were answered. The patient agreed with the plan and demonstrated an understanding of the  instructions.  Patient advised to call back or seek an in-person evaluation if the symptoms or condition worsens.  Duration of encounter: *** minutes.  Total time on encounter, as per AMA guidelines included both the face-to-face and non-face-to-face time personally spent by the physician and/or other qualified health care professional(s) on the day of the encounter (includes time in activities that require the physician or other qualified health care professional and does not include time in activities normally performed by clinical staff). Physician's time may include the following activities when performed: Preparing to see the patient (e.g., pre-charting review of records, searching for previously ordered imaging, lab work, and nerve conduction tests) Review of prior analgesic pharmacotherapies. Reviewing PMP Interpreting ordered tests (e.g., lab work, imaging, nerve conduction tests) Performing post-procedure evaluations, including interpretation of diagnostic procedures Obtaining and/or reviewing separately obtained history Performing a medically appropriate examination and/or evaluation Counseling and educating the patient/family/caregiver Ordering medications, tests, or procedures Referring and communicating with other health care professionals (when not separately reported) Documenting clinical information in the electronic or other health record Independently interpreting results (not separately reported) and communicating results to the patient/ family/caregiver Care coordination (not separately reported)  Note by: Oswaldo Done, MD Date: 11/10/2022; Time: 7:22 AM

## 2022-11-06 DIAGNOSIS — M5459 Other low back pain: Secondary | ICD-10-CM | POA: Insufficient documentation

## 2022-11-06 DIAGNOSIS — M47816 Spondylosis without myelopathy or radiculopathy, lumbar region: Secondary | ICD-10-CM | POA: Insufficient documentation

## 2022-11-10 ENCOUNTER — Encounter: Payer: Self-pay | Admitting: Pain Medicine

## 2022-11-10 ENCOUNTER — Ambulatory Visit
Admission: RE | Admit: 2022-11-10 | Discharge: 2022-11-10 | Disposition: A | Discharge status: Home / Self Care | Payer: Medicare HMO | Source: Ambulatory Visit | Attending: Pain Medicine | Admitting: Pain Medicine

## 2022-11-10 ENCOUNTER — Ambulatory Visit: Payer: Medicare HMO | Attending: Pain Medicine | Admitting: Pain Medicine

## 2022-11-10 VITALS — BP 127/74 | HR 79 | Temp 98.2°F | Ht 63.0 in | Wt 154.0 lb

## 2022-11-10 DIAGNOSIS — M5116 Intervertebral disc disorders with radiculopathy, lumbar region: Secondary | ICD-10-CM | POA: Insufficient documentation

## 2022-11-10 DIAGNOSIS — M25512 Pain in left shoulder: Secondary | ICD-10-CM | POA: Insufficient documentation

## 2022-11-10 DIAGNOSIS — M542 Cervicalgia: Secondary | ICD-10-CM | POA: Insufficient documentation

## 2022-11-10 DIAGNOSIS — M545 Low back pain, unspecified: Secondary | ICD-10-CM | POA: Insufficient documentation

## 2022-11-10 DIAGNOSIS — M5136 Other intervertebral disc degeneration, lumbar region: Secondary | ICD-10-CM

## 2022-11-10 DIAGNOSIS — M47816 Spondylosis without myelopathy or radiculopathy, lumbar region: Secondary | ICD-10-CM | POA: Insufficient documentation

## 2022-11-10 DIAGNOSIS — W19XXXD Unspecified fall, subsequent encounter: Secondary | ICD-10-CM | POA: Insufficient documentation

## 2022-11-10 DIAGNOSIS — M4726 Other spondylosis with radiculopathy, lumbar region: Secondary | ICD-10-CM | POA: Insufficient documentation

## 2022-11-10 DIAGNOSIS — M79641 Pain in right hand: Secondary | ICD-10-CM | POA: Insufficient documentation

## 2022-11-10 DIAGNOSIS — M79602 Pain in left arm: Secondary | ICD-10-CM | POA: Insufficient documentation

## 2022-11-10 DIAGNOSIS — M5459 Other low back pain: Secondary | ICD-10-CM | POA: Insufficient documentation

## 2022-11-10 DIAGNOSIS — W19XXXA Unspecified fall, initial encounter: Secondary | ICD-10-CM | POA: Insufficient documentation

## 2022-11-10 DIAGNOSIS — M25511 Pain in right shoulder: Secondary | ICD-10-CM | POA: Insufficient documentation

## 2022-11-10 DIAGNOSIS — G8929 Other chronic pain: Secondary | ICD-10-CM | POA: Insufficient documentation

## 2022-11-10 DIAGNOSIS — M25532 Pain in left wrist: Secondary | ICD-10-CM | POA: Insufficient documentation

## 2022-11-10 DIAGNOSIS — M79642 Pain in left hand: Secondary | ICD-10-CM | POA: Insufficient documentation

## 2022-11-10 DIAGNOSIS — M4313 Spondylolisthesis, cervicothoracic region: Secondary | ICD-10-CM | POA: Insufficient documentation

## 2022-11-10 MED ORDER — PREDNISONE 20 MG PO TABS
ORAL_TABLET | ORAL | 0 refills | Status: AC
Start: 2022-11-10 — End: 2022-11-19

## 2022-11-10 NOTE — Patient Instructions (Addendum)
Procedure instructions  Do not eat or drink fluids (other than water) for 6 hours before your procedure  No water for 2 hours before your procedure  Take your blood pressure medicine with a sip of water  Arrive 30 minutes before your appointment  Carefully read the "Preparing for your procedure" detailed instructions  If you have questions call us at [phone]  _____________________________________________________________________    ______________________________________________________________________  Preparing for your procedure  Appointments: If you think you may not be able to keep your appointment, call 24-48 hours in advance to cancel. We need time to make it available to others.  During your procedure appointment there will be: No Prescription Refills. No disability issues to discussed. No medication changes or discussions.  Instructions: Food intake: Avoid eating anything solid for at least 8 hours prior to your procedure. Clear liquid intake: You may take clear liquids such as water up to 2 hours prior to your procedure. (No carbonated drinks. No soda.) Transportation: Unless otherwise stated by your physician, bring a driver. Morning Medicines: Except for blood thinners, take all of your other morning medications with a sip of water. Make sure to take your heart and blood pressure medicines. If your blood pressure's lower number is above 100, the case will be rescheduled. Blood thinners: Make sure to stop your blood thinners as instructed.  If you take a blood thinner, but were not instructed to stop it, call our office [phone] and ask to talk to a nurse. Not stopping a blood thinner prior to certain procedures could lead to serious complications. Diabetics on insulin: Notify the staff so that you can be scheduled 1st case in the morning. If your diabetes requires high dose insulin, take only  of your normal insulin dose the morning of the procedure and  notify the staff that you have done so. Preventing infections: Shower with an antibacterial soap the morning of your procedure.  Build-up your immune system: Take 1000 mg of Vitamin C with every meal (3 times a day) the day prior to your procedure. Antibiotics: Inform the nursing staff if you are taking any antibiotics or if you have any conditions that may require antibiotics prior to procedures. (Example: recent joint implants)   Pregnancy: If you are pregnant make sure to notify the nursing staff. Not doing so may result in injury to the fetus, including death.  Sickness: If you have a cold, fever, or any active infections, call and cancel or reschedule your procedure. Receiving steroids while having an infection may result in complications. Arrival: You must be in the facility at least 30 minutes prior to your scheduled procedure. Tardiness: Your scheduled time is also the cutoff time. If you do not arrive at least 15 minutes prior to your procedure, you will be rescheduled.  Children: Do not bring any children with you. Make arrangements to keep them home. Dress appropriately: There is always a possibility that your clothing may get soiled. Avoid long dresses. Valuables: Do not bring any jewelry or valuables.  Reasons to call and reschedule or cancel your procedure: (Following these recommendations will minimize the risk of a serious complication.) Surgeries: Avoid having procedures within 2 weeks of any surgery. (Avoid for 2 weeks before or after any surgery). Flu Shots: Avoid having procedures within 2 weeks of a flu shots or . (Avoid for 2 weeks before or after immunizations). Barium: Avoid having a procedure within 7-10 days after having had a radiological study involving the use of radiological contrast. (  Myelograms, Barium swallow or enema study). Heart attacks: Avoid any elective procedures or surgeries for the initial 6 months after a "Myocardial Infarction" (Heart Attack). Blood  thinners: It is imperative that you stop these medications before procedures. Let us know if you if you take any blood thinner.  Infection: Avoid procedures during or within two weeks of an infection (including chest colds or gastrointestinal problems). Symptoms associated with infections include: Localized redness, fever, chills, night sweats or profuse sweating, burning sensation when voiding, cough, congestion, stuffiness, runny nose, sore throat, diarrhea, nausea, vomiting, cold or Flu symptoms, recent or current infections. It is specially important if the infection is over the area that we intend to treat. Heart and lung problems: Symptoms that may suggest an active cardiopulmonary problem include: cough, chest pain, breathing difficulties or shortness of breath, dizziness, ankle swelling, uncontrolled high or unusually low blood pressure, and/or palpitations. If you are experiencing any of these symptoms, cancel your procedure and contact your primary care physician for an evaluation.  Remember:  Regular Business hours are:  Monday to Thursday 8:00 AM to 4:00 PM  Provider's Schedule: Delano Metz, MD:  Procedure days: Tuesday and Thursday 7:30 AM to 4:00 PM  Edward Jolly, MD:  Procedure days: Monday and Wednesday 7:30 AM to 4:00 PM______________________________________________________________________    OTC Supplements: The following over-the-counter (OTC) supplements may be of some benefits when used in moderation in some chronic pain conditions. Note: Always consult with your primary care provider and/or pharmacist before taking any OTC medications to make sure there are no "drug-to-drug" interactions with the medications you currently take. Ask your physician which may be beneficial for your particular condition.  Supplement Possible benefit May be of benefit in treatment of   Turmeric/curcumin* anti-inflammatory Joint and muscle aches and pain associated with arthritis and  inflammation  Glucosamine/chondroitin (triple strength)* may slow loss of articular cartilage Osteoarthritis  Vitamin D-3* may suppress release of chemicals associated with inflammation Joint and muscle aches and pain associated with arthritis and inflammation  Moringa* anti-inflammatory with mild analgesic effects Joint and muscle aches and pain associated with arthritis and inflammation  Melatonin* Helps reset sleep cycle. Insomnia. May also be helpful in neurodegenerative disorders  Vitamin B-12* may help keep nerves and blood cells healthy as well as maintaining function of nervous system Neuropathies. Nerve pain (Burning pain)  Alpha-Lipoic-Acid (ALA)* antioxidant that may help with nerve health, pain, and blocking the activation of some inflammatory chemicals Diabetic neuropathy and metabolic syndrome       (*Always use manufacturer's recommended dosage.)  ______________________________________________________________________

## 2022-11-10 NOTE — Progress Notes (Signed)
Safety precautions to be maintained throughout the outpatient stay will include: orient to surroundings, keep bed in low position, maintain call bell within reach at all times, provide assistance with transfer out of bed and ambulation.  

## 2022-11-11 ENCOUNTER — Telehealth: Payer: Self-pay

## 2022-11-11 NOTE — Telephone Encounter (Signed)
You put in an order for lumbar facets, which I got authorization for. In the check out comments you put steroid taper and return for eval. Do you want me to schedule the lumbar facets?

## 2022-11-15 ENCOUNTER — Other Ambulatory Visit: Payer: Self-pay | Admitting: Pain Medicine

## 2022-11-15 DIAGNOSIS — G8929 Other chronic pain: Secondary | ICD-10-CM

## 2022-11-15 DIAGNOSIS — M47816 Spondylosis without myelopathy or radiculopathy, lumbar region: Secondary | ICD-10-CM

## 2022-11-15 DIAGNOSIS — M5459 Other low back pain: Secondary | ICD-10-CM

## 2022-11-15 DIAGNOSIS — M545 Low back pain, unspecified: Secondary | ICD-10-CM

## 2022-11-24 ENCOUNTER — Ambulatory Visit: Payer: Medicare HMO | Admitting: Pain Medicine

## 2022-11-28 NOTE — Progress Notes (Unsigned)
PROVIDER NOTE: Information contained herein reflects review and annotations entered in association with encounter. Interpretation of such information and data should be left to medically-trained personnel. Information provided to patient can be located elsewhere in the medical record under "Patient Instructions". Document created using STT-dictation technology, any transcriptional errors that may result from process are unintentional.    Patient: Alyssa Wood  Service Category: E/M  Provider: Oswaldo Done, MD  DOB: [birth year]  DOS: 11/29/2022  Referring Provider: Center, Darcella Gasman*  MRN: [id number]  Specialty: Interventional Pain Management  PCP: [facility]  Type: Established Patient  Setting: Ambulatory outpatient    Location: Office  Delivery: Face-to-face     HPI  Alyssa Wood, a 62 y.o. year old female, is here today because of her No primary diagnosis found.. Ms. Wood's primary complain today is No chief complaint on file.  Pertinent problems: [name] has Chronic neck pain (1ry area of Pain) (Bilateral) (R>L); Chronic low back pain (4th area of Pain) (Bilateral) (L>R) w/o sciatica; Chronic upper extremity pain (Bilateral) (R>L); Chronic pain syndrome; Chronic shoulder pain (2ry area of Pain) (Bilateral) (R>L); Cervicogenic headache (3ry area of Pain); Cervical Spinal cord stimulator (Fractured Tip); Osteoarthritis; Chronic musculoskeletal pain; Chronic cervical radiculitis (Bilateral) (R>L); Cervical spondylosis with radiculopathy (Bilateral); Chronic shoulder radicular pain (Bilateral); DDD (degenerative disc disease), cervical; Cervicalgia (1ry area of Pain) (Bilateral) (R>L); Neurogenic pain; Chronic neuropathic pain; Chronic hand pain (Bilateral) (R>L); Chronic trigger finger, middle finger (Right); Cervical myofascial pain syndrome; DDD (degenerative disc disease), lumbar; Low back pain of over 3 months  duration; Chronic upper back pain; Thoracic radiculopathy (Right); Chronic pain of right upper extremity; Radicular pain of right upper extremity; Chronic right shoulder pain; Impaired range of motion of both shoulders; Battery end of life of spinal cord stimulator; Lumbar facet joint pain; Lumbar facet arthropathy; Acute neck pain (Left); and Acute pain of left shoulder on their pertinent problem list. Pain Assessment: Severity of   is reported as a  /10. Location:    / . Onset:  . Quality:  . Timing:  . Modifying factor(s):  [name] Vitals:  vitals were not taken for this visit.  BMI: Estimated body mass index is 27.28 kg/m as calculated from the following:   Height as of 11/10/22: 5\' 3"  (1.6 m).   Weight as of 11/10/22: 154 lb (69.9 kg). Last encounter: 11/10/2022. Last procedure: 03/09/2022.  Reason for encounter: follow-up evaluation. ***  Pharmacotherapy Assessment  Analgesic: No chronic opioid analgesics therapy prescribed by our practice. Tramadol 50 mg, 1 tab PO QD (PRN) (50 mg/day of tramadol)  MME/day: 5 mg/day.   Monitoring: [place] PMP: PDMP reviewed during this encounter.       Pharmacotherapy: No side-effects or adverse reactions reported. Compliance: No problems identified. Effectiveness: Clinically acceptable.  No notes on file  No results found for: "CBDTHCR" No results found for: "D8THCCBX" No results found for: "D9THCCBX"  UDS:  Summary  Date Value Ref Range Status  07/03/2019 Note  Final    Comment:    ==================================================================== [employer] Select 13 (MW) ==================================================================== Test                             Result       Flag       Units Drug Present and Declared for Prescription Verification   Tramadol                       >  6024        EXPECTED   ng/mg creat   O-Desmethyltramadol            >6024        EXPECTED   ng/mg creat   N-Desmethyltramadol            541          EXPECTED    ng/mg creat    Source of tramadol is a prescription medication. O-desmethyltramadol    and N-desmethyltramadol are expected metabolites of tramadol. ==================================================================== Test                      Result    Flag   Units      Ref Range   Creatinine              83               mg/dL      >=16 ==================================================================== Declared Medications:  The flagging and interpretation on this report are based on the  following declared medications.  Unexpected results may arise from  inaccuracies in the declared medications.  **Note: The testing scope of this panel includes these medications:  Tramadol (Ultram)  **Note: The testing scope of this panel does not include the  following reported medications:  Calcium  Cyanocobalamin  Gabapentin (Neurontin)  Hydrochlorothiazide (Zestoretic)  Lisinopril (Zestoretic)  Meloxicam (Mobic)  Metformin (Glucophage)  Omega-3 Fatty Acids  Omeprazole (Prilosec)  Sertraline (Zoloft)  Topical  Vitamin D ==================================================================== For clinical consultation, please call [phone]. ====================================================================       ROS  Constitutional: Denies any fever or chills Gastrointestinal: No reported hemesis, hematochezia, vomiting, or acute GI distress Musculoskeletal: Denies any acute onset joint swelling, redness, loss of ROM, or weakness Neurological: No reported episodes of acute onset apraxia, aphasia, dysarthria, agnosia, amnesia, paralysis, loss of coordination, or loss of consciousness  Medication Review  Cyanocobalamin, Fish Oil, losartan-hydrochlorothiazide, metFORMIN, omeprazole, rosuvastatin, sertraline, sucralfate, and traMADol  History Review  Allergy: [name] is allergic to trazodone and nefazodone and hydrocodone. Drug: [name]  reports no  history of drug use. Alcohol:  reports current alcohol use of about 1.0 standard drink of alcohol per week. Tobacco:  reports that she has never smoked. She has never used smokeless tobacco. Social: [name]  reports that she has never smoked. She has never used smokeless tobacco. She reports current alcohol use of about 1.0 standard drink of alcohol per week. She reports that she does not use drugs. Medical:  has a past medical history of Anxiety, Chronic pain, Depression, Diabetes mellitus without complication (HCC), GERD (gastroesophageal reflux disease), and Hypertension. Surgical: Ms. Weisbrodt  has a past surgical history that includes Cholecystectomy (1992); Spinal cord stimulator implant (Right, Jan 2013); Shoulder surgery (Right); Wrist surgery (Right); Colonoscopy with propofol (N/A, 08/28/2015); polypectomy (08/28/2015); Esophagogastroduodenoscopy (egd) with propofol (N/A, 02/13/2021); Givens capsule study (N/A, 03/02/2021); Colonoscopy with propofol (N/A, 02/15/2021); and Spinal cord stimulator battery exchange (N/A, 07/26/2022). Family: family history includes Cancer in her father; Heart disease in her father.  Laboratory Chemistry Profile   Renal Lab Results  Component Value Date   BUN 14 07/20/2022   CREATININE 0.86 07/20/2022   BCR 19 01/11/2017   GFRAA >60 08/16/2019   GFRNONAA >60 07/20/2022    Hepatic Lab Results  Component Value Date   AST 32 09/23/2021   ALT 24 09/23/2021   ALBUMIN 4.1 09/23/2021   ALKPHOS 116 09/23/2021   LIPASE  32 02/12/2021    Electrolytes Lab Results  Component Value Date   NA 134 (L) 07/20/2022   K 3.5 07/20/2022   CL 100 07/20/2022   CALCIUM 9.4 07/20/2022   MG 2.3 01/11/2017    Bone Lab Results  Component Value Date   25OHVITD1 22 (L) 01/11/2017   25OHVITD2 2.5 01/11/2017   25OHVITD3 19 01/11/2017    Inflammation (CRP: Acute Phase) (ESR: Chronic Phase) Lab Results  Component Value Date   CRP 7 02/22/2022    ESRSEDRATE 37 02/22/2022         Note: Above Lab results reviewed.  Recent Imaging Review  DG Shoulder Left CLINICAL DATA:  Left shoulder pain  EXAM: LEFT SHOULDER - 2+ VIEW  COMPARISON:  Shoulder radiographs 02/22/2022  FINDINGS: There is no acute osseous finding in the shoulder. Bony alignment is normal. The acromioclavicular and glenohumeral joint spaces are preserved, without osteoarthritic change. There is no erosive change. The soft tissues are unremarkable.  IMPRESSION: Normal shoulder radiographs.  Electronically Signed   By: Lesia Hausen M.D.   On: 11/22/2022 11:14 DG Cervical Spine With Flex & Extend CLINICAL DATA:  Chronic neck pain with possible radicular component.  EXAM: CERVICAL SPINE COMPLETE WITH FLEXION AND EXTENSION VIEWS  COMPARISON:  Cervical spine CT 03/12/2022  FINDINGS: The cervical spine is imaged through the T1 vertebral body in the lateral projection. Vertebral body heights are preserved, without evidence of acute injury.  In the neutral position, there is 2 mm grade 1 anterolisthesis of C7 on T1 which is not significantly changed with flexion or extension. There is no antero or retrolisthesis or dynamic translation at the other levels.  The disc heights are preserved, with mild degenerative endplate change anteriorly at C3 through C5. The facets appear overall unremarkable, with no significant osseous neural foraminal stenosis.  The prevertebral soft tissues are unremarkable. A spinal stimulator lead is noted terminating at the C2 level. The imaged lung apices are clear.  IMPRESSION: 1. 2 mm grade 1 anterolisthesis of C7 on T1 without dynamic translation. 2. Mild degenerative endplate change anteriorly at C3 through C5 without significant disc space narrowing. 3. No significant osseous neural foraminal stenosis.  Electronically Signed   By: Lesia Hausen M.D.   On: 11/22/2022 11:12 Note: Reviewed        Physical Exam   General appearance: Well nourished, well developed, and well hydrated. In no apparent acute distress Mental status: Alert, oriented x 3 (person, place, & time)       Respiratory: No evidence of acute respiratory distress Eyes: PERLA Vitals: There were no vitals taken for this visit. BMI: Estimated body mass index is 27.28 kg/m as calculated from the following:   Height as of 11/10/22: 5\' 3"  (1.6 m).   Weight as of 11/10/22: 154 lb (69.9 kg). Ideal: Patient weight not recorded  Assessment   Diagnosis Status  No diagnosis found. Controlled Controlled Controlled   Updated Problems: No problems updated.  Plan of Care  Problem-specific:  No problem-specific Assessment & Plan notes found for this encounter.  Ms. Kyrianna Pettitt has a current medication list which includes the following long-term medication(s): losartan-hydrochlorothiazide, metformin, omeprazole, rosuvastatin, and sucralfate.  Pharmacotherapy (Medications Ordered): No orders of the defined types were placed in this encounter.  Orders:  No orders of the defined types were placed in this encounter.  Follow-up plan:   No follow-ups on file.      Interventional Therapies  Risk Factors  Considerations:  Anxiety  Depression  GERD  HTN  Hx of upper GI bleed  Fractured SCS lead              Planned  Pending:   Diagnostic bilateral lumbar facet MBB #1  Referral to physical therapy to work with hands Referral to hand surgery for possible surgical alternatives to her right hand trigger finger. Diagnostic lab work: Sed rate, CRP We will try to coordinate one of her visits with that of the [employer] representative so that they can "jumpstart" her spinal cord stimulator again.   Under consideration:   Diagnostic bilateral cervical facet MBB #1  Diagnostic/therapeutic right hand, middle trigger finger injection #1  Therapeutic bilateral suprascapular NB #2  Therapeutic bilateral suprascapular  nerve RFA #1    Completed:   Diagnostic bilateral suprascapular NB x1 (03/09/2022) (100/100/80/90)  Therapeutic right C7-T1 CESI x2 (08/04/2017) (100/100/100 x 1 day/60/>75)  Diagnostic cervical spinal cord stimulator trial    Completed by other providers:   None at this time   Therapeutic  Palliative (PRN) options:   Palliative right CESI #3   Pharmacotherapy  The patient has been using the tramadol to control her pain and she had developed an ulcer after having taken NSAIDs.  The patient was recommended to use Prilosec or Nexium on a regular basis.  Doing so and taking an occasional NSAID should be okay as long as she does not take it regularly.        Recent Visits Date Type Provider Dept  11/10/22 Office Visit Delano Metz, MD [facility]  Showing recent visits within past 90 days and meeting all other requirements Future Appointments Date Type Provider Dept  11/29/22 Appointment Delano Metz, MD [facility]  11/30/22 Appointment Delano Metz, MD [facility]  Showing future appointments within next 90 days and meeting all other requirements  I discussed the assessment and treatment plan with the patient. The patient was provided an opportunity to ask questions and all were answered. The patient agreed with the plan and demonstrated an understanding of the instructions.  Patient advised to call back or seek an in-person evaluation if the symptoms or condition worsens.  Duration of encounter: *** minutes.  Total time on encounter, as per AMA guidelines included both the face-to-face and non-face-to-face time personally spent by the physician and/or other qualified health care professional(s) on the day of the encounter (includes time in activities that require the physician or other qualified health care professional and does not include time in activities normally performed by clinical staff). Physician's time may include the  following activities when performed: Preparing to see the patient (e.g., pre-charting review of records, searching for previously ordered imaging, lab work, and nerve conduction tests) Review of prior analgesic pharmacotherapies. Reviewing PMP Interpreting ordered tests (e.g., lab work, imaging, nerve conduction tests) Performing post-procedure evaluations, including interpretation of diagnostic procedures Obtaining and/or reviewing separately obtained history Performing a medically appropriate examination and/or evaluation Counseling and educating the patient/family/caregiver Ordering medications, tests, or procedures Referring and communicating with other health care professionals (when not separately reported) Documenting clinical information in the electronic or other health record Independently interpreting results (not separately reported) and communicating results to the patient/ family/caregiver Care coordination (not separately reported)  Note by: Oswaldo Done, MD Date: 11/29/2022; Time: 8:04 PM

## 2022-11-29 ENCOUNTER — Ambulatory Visit (HOSPITAL_BASED_OUTPATIENT_CLINIC_OR_DEPARTMENT_OTHER): Payer: Medicare HMO | Admitting: Pain Medicine

## 2022-11-29 DIAGNOSIS — G8929 Other chronic pain: Secondary | ICD-10-CM

## 2022-11-29 DIAGNOSIS — Z91199 Patient's noncompliance with other medical treatment and regimen due to unspecified reason: Secondary | ICD-10-CM

## 2022-11-29 NOTE — Patient Instructions (Signed)

## 2022-11-30 ENCOUNTER — Ambulatory Visit: Payer: Medicare HMO | Admitting: Pain Medicine

## 2023-01-27 ENCOUNTER — Other Ambulatory Visit: Payer: Self-pay | Admitting: Gastroenterology

## 2023-02-07 ENCOUNTER — Other Ambulatory Visit: Payer: Self-pay

## 2023-02-07 ENCOUNTER — Other Ambulatory Visit: Payer: Self-pay | Admitting: Gastroenterology

## 2023-02-07 MED ORDER — PANTOPRAZOLE SODIUM 20 MG PO TBEC: 20.0000 mg | DELAYED_RELEASE_TABLET | Freq: Every day | ORAL | 0 refills | Status: DC

## 2023-05-24 ENCOUNTER — Other Ambulatory Visit: Payer: Self-pay | Admitting: Family Medicine

## 2023-05-24 DIAGNOSIS — Z1231 Encounter for screening mammogram for malignant neoplasm of breast: Secondary | ICD-10-CM

## 2023-06-14 DIAGNOSIS — Z9682 Presence of neurostimulator: Secondary | ICD-10-CM | POA: Insufficient documentation

## 2023-06-14 NOTE — Progress Notes (Unsigned)
 PROVIDER NOTE: Interpretation of information contained herein should be left to medically-trained personnel. Specific patient instructions are provided elsewhere under "Patient Instructions" section of medical record. This document was created in part using AI and STT-dictation technology, any transcriptional errors that may result from this process are unintentional.  Patient: Alyssa Wood  Service: E/M   PCP: [facility]  DOB: 06/05/1960  DOS: 06/15/2023  Provider: Candi Chafe, MD  MRN: [id number]  Delivery: Face-to-face  Specialty: Interventional Pain Management  Type: Established Patient  Setting: Ambulatory outpatient facility  Specialty designation: 09  Referring Prov.: [facility]  Location: Outpatient office facility       HPI  Alyssa Wood, a 63 y.o. year old female, is here today because of her Cervicalgia [M54.2]. Alyssa Wood's primary complain today is No chief complaint on file.  Pertinent problems: Alyssa Wood has Chronic neck pain (1ry area of Pain) (Bilateral) (R>L); Chronic low back pain (4th area of Pain) (Bilateral) (L>R) w/o sciatica; Chronic upper extremity pain (Bilateral) (R>L); Chronic pain syndrome; Chronic shoulder pain (2ry area of Pain) (Bilateral) (R>L); Cervicogenic headache (3ry area of Pain); Cervical Spinal cord stimulator (Fractured Tip); Osteoarthritis; Chronic musculoskeletal pain; Chronic cervical radiculitis (Bilateral) (R>L); Cervical spondylosis with radiculopathy (Bilateral); Chronic shoulder radicular pain (Bilateral); DDD (degenerative disc disease), cervical; Cervicalgia (1ry area of Pain) (Bilateral) (R>L); Neurogenic pain; Chronic neuropathic pain; Chronic hand pain (Bilateral) (R>L); Chronic trigger finger, middle finger (Right); Cervical myofascial pain syndrome; DDD (degenerative disc disease), lumbar; Low back pain of over 3 months duration; Chronic upper back pain;  Thoracic radiculopathy (Right); Chronic pain of right upper extremity; Radicular pain of right upper extremity; Chronic right shoulder pain; Impaired range of motion of both shoulders; Battery end of life of spinal cord stimulator; Lumbar facet joint pain; Lumbar facet arthropathy; Acute neck pain (Left); Acute pain of left shoulder; and Presence of neurostimulator (Cervical SCS) on their pertinent problem list. Pain Assessment: Severity of   is reported as a  /10. Location:    / . Onset:  . Quality:  . Timing:  . Modifying factor(s):  Alyssa Wood Vitals:  vitals were not taken for this visit.  BMI: Estimated body mass index is 27.28 kg/m as calculated from the following:   Height as of 11/10/22: 5\' 3"  (1.6 m).   Weight as of 11/10/22: 154 lb (69.9 kg). Last encounter: 11/29/2022. Last procedure: Visit date not found.  Reason for encounter: evaluation of worsening, or previously known (established) problem. ***  Discussed the use of AI scribe software for clinical note transcription with the patient, who gave verbal consent to proceed.  History of Present Illness           Pharmacotherapy Assessment  Analgesic: No chronic opioid analgesics therapy prescribed by our practice. Tramadol  50 mg, 1 tab PO QD (PRN) (50 mg/day of tramadol )  MME/day: 5 mg/day.   Monitoring: [place] PMP: PDMP reviewed during this encounter.       Pharmacotherapy: No side-effects or adverse reactions reported. Compliance: No problems identified. Effectiveness: Clinically acceptable.  No notes on file  No results found for: "CBDTHCR" No results found for: "D8THCCBX" No results found for: "D9THCCBX"  UDS:  Summary  Date Value Ref Range Status  07/03/2019 Note  Final    Comment:    ==================================================================== [employer] Select 13 (MW) ==================================================================== Test  Result       Flag       Units Drug Present and  Declared for Prescription Verification   Tramadol                        >6024        EXPECTED   ng/mg creat   O-Desmethyltramadol            >6024        EXPECTED   ng/mg creat   N-Desmethyltramadol            541          EXPECTED   ng/mg creat    Source of tramadol  is a prescription medication. O-desmethyltramadol    and N-desmethyltramadol are expected metabolites of tramadol . ==================================================================== Test                      Result    Flag   Units      Ref Range   Creatinine              83               mg/dL      >=25 ==================================================================== Declared Medications:  The flagging and interpretation on this report are based on the  following declared medications.  Unexpected results may arise from  inaccuracies in the declared medications.  **Note: The testing scope of this panel includes these medications:  Tramadol  (Ultram )  **Note: The testing scope of this panel does not include the  following reported medications:  Calcium   Cyanocobalamin  Gabapentin  (Neurontin )  Hydrochlorothiazide (Zestoretic)  Lisinopril (Zestoretic)  Meloxicam  (Mobic )  Metformin (Glucophage)  Omega-3 Fatty Acids  Omeprazole  (Prilosec)  Sertraline (Zoloft)  Topical  Vitamin D  ==================================================================== For clinical consultation, please call [phone]. ====================================================================       ROS  Constitutional: Denies any fever or chills Gastrointestinal: No reported hemesis, hematochezia, vomiting, or acute GI distress Musculoskeletal: Denies any acute onset joint swelling, redness, loss of ROM, or weakness Neurological: No reported episodes of acute onset apraxia, aphasia, dysarthria, agnosia, amnesia, paralysis, loss of coordination, or loss of consciousness  Medication Review  Cyanocobalamin, Fish Oil,  losartan-hydrochlorothiazide, metFORMIN, omeprazole , pantoprazole , rosuvastatin, sertraline, sucralfate, and traMADol   History Review  Allergy : Alyssa Wood is allergic to trazodone and nefazodone and hydrocodone. Drug: Alyssa Wood  reports no history of drug use. Alcohol:  reports current alcohol use of about 1.0 standard drink of alcohol per week. Tobacco:  reports that she has never smoked. She has never used smokeless tobacco. Social: Alyssa Wood  reports that she has never smoked. She has never used smokeless tobacco. She reports current alcohol use of about 1.0 standard drink of alcohol per week. She reports that she does not use drugs. Medical:  has a past medical history of Anxiety, Chronic pain, Depression, Diabetes mellitus without complication (HCC), GERD (gastroesophageal reflux disease), and Hypertension. Surgical: Alyssa Wood  has a past surgical history that includes Cholecystectomy (1992); Spinal cord stimulator implant (Right, Jan 2013); Shoulder surgery (Right); Wrist surgery (Right); Colonoscopy with propofol  (N/A, 08/28/2015); polypectomy (08/28/2015); Esophagogastroduodenoscopy (egd) with propofol  (N/A, 02/13/2021); Givens capsule study (N/A, 03/02/2021); Colonoscopy with propofol  (N/A, 02/15/2021); and Spinal cord stimulator battery exchange (N/A, 07/26/2022). Family: family history includes Cancer in her father; Heart disease in her father.  Laboratory Chemistry Profile   Renal Lab Results  Component Value Date   BUN 14 07/20/2022   CREATININE 0.86 07/20/2022  BCR 19 01/11/2017   GFRAA >60 08/16/2019   GFRNONAA >60 07/20/2022    Hepatic Lab Results  Component Value Date   AST 32 09/23/2021   ALT 24 09/23/2021   ALBUMIN 4.1 09/23/2021   ALKPHOS 116 09/23/2021   LIPASE 32 02/12/2021    Electrolytes Lab Results  Component Value Date   NA 134 (L) 07/20/2022   K 3.5 07/20/2022   CL 100 07/20/2022   CALCIUM  9.4 07/20/2022   MG 2.3  01/11/2017    Bone Lab Results  Component Value Date   25OHVITD1 22 (L) 01/11/2017   25OHVITD2 2.5 01/11/2017   25OHVITD3 19 01/11/2017    Inflammation (CRP: Acute Phase) (ESR: Chronic Phase) Lab Results  Component Value Date   CRP 7 02/22/2022   ESRSEDRATE 37 02/22/2022         Note: Above Lab results reviewed.  Recent Imaging Review  DG Shoulder Left CLINICAL DATA:  Left shoulder pain  EXAM: LEFT SHOULDER - 2+ VIEW  COMPARISON:  Shoulder radiographs 02/22/2022  FINDINGS: There is no acute osseous finding in the shoulder. Bony alignment is normal. The acromioclavicular and glenohumeral joint spaces are preserved, without osteoarthritic change. There is no erosive change. The soft tissues are unremarkable.  IMPRESSION: Normal shoulder radiographs.  Electronically Signed   By: Eldora Greet M.D.   On: 11/22/2022 11:14 DG Cervical Spine With Flex & Extend CLINICAL DATA:  Chronic neck pain with possible radicular component.  EXAM: CERVICAL SPINE COMPLETE WITH FLEXION AND EXTENSION VIEWS  COMPARISON:  Cervical spine CT 03/12/2022  FINDINGS: The cervical spine is imaged through the T1 vertebral body in the lateral projection. Vertebral body heights are preserved, without evidence of acute injury.  In the neutral position, there is 2 mm grade 1 anterolisthesis of C7 on T1 which is not significantly changed with flexion or extension. There is no antero or retrolisthesis or dynamic translation at the other levels.  The disc heights are preserved, with mild degenerative endplate change anteriorly at C3 through C5. The facets appear overall unremarkable, with no significant osseous neural foraminal stenosis.  The prevertebral soft tissues are unremarkable. A spinal stimulator lead is noted terminating at the C2 level. The imaged lung apices are clear.  IMPRESSION: 1. 2 mm grade 1 anterolisthesis of C7 on T1 without dynamic translation. 2. Mild degenerative  endplate change anteriorly at C3 through C5 without significant disc space narrowing. 3. No significant osseous neural foraminal stenosis.  Electronically Signed   By: Eldora Greet M.D.   On: 11/22/2022 11:12 Note: Reviewed        Physical Exam  General appearance: Well nourished, well developed, and well hydrated. In no apparent acute distress Mental status: Alert, oriented x 3 (person, place, & time)       Respiratory: No evidence of acute respiratory distress Eyes: PERLA Vitals: There were no vitals taken for this visit. BMI: Estimated body mass index is 27.28 kg/m as calculated from the following:   Height as of 11/10/22: 5\' 3"  (1.6 m).   Weight as of 11/10/22: 154 lb (69.9 kg). Ideal: Patient weight not recorded  Assessment   Diagnosis Status  1. Cervicalgia (1ry area of Pain) (Bilateral) (R>L)   2. Chronic neck pain (1ry area of Pain) (Bilateral) (R>L)   3. Chronic shoulder pain (2ry area of Pain) (Bilateral) (R>L)   4. Cervicogenic headache (3ry area of Pain)   5. Chronic low back pain (4th area of Pain) (Bilateral) (L>R) w/o sciatica   6.  Chronic shoulder radicular pain (Bilateral)   7. Radicular pain of right upper extremity   8. Cervical Spinal cord stimulator (Fractured Tip)   9. Presence of neurostimulator (Cervical SCS)    Controlled Controlled Controlled   Updated Problems: Problem  Presence of neurostimulator (Cervical SCS)    Plan of Care  Problem-specific:  Assessment and Plan            Alyssa Wood has a current medication list which includes the following long-term medication(s): losartan-hydrochlorothiazide, metformin, omeprazole , pantoprazole , rosuvastatin, and sucralfate.  Pharmacotherapy (Medications Ordered): No orders of the defined types were placed in this encounter.  Orders:  No orders of the defined types were placed in this encounter.  Follow-up plan:   No follow-ups on file.     Interventional Therapies  Risk  Factors  Considerations:  Anxiety  Depression  GERD  HTN  Hx of upper GI bleed  Fractured SCS lead              Planned  Pending:   Diagnostic bilateral lumbar facet MBB #1  Referral to physical therapy to work with hands Referral to hand surgery for possible surgical alternatives to her right hand trigger finger. Diagnostic lab work: Sed rate, CRP We will try to coordinate one of her visits with that of the [employer] representative so that they can "jumpstart" her spinal cord stimulator again.   Under consideration:   Diagnostic bilateral cervical facet MBB #1  Diagnostic/therapeutic right hand, middle trigger finger injection #1  Therapeutic bilateral suprascapular NB #2  Therapeutic bilateral suprascapular nerve RFA #1    Completed:   Diagnostic bilateral suprascapular NB x1 (03/09/2022) (100/100/80/90)  Therapeutic right C7-T1 CESI x2 (08/04/2017) (100/100/100 x 1 day/60/>75)  Diagnostic cervical spinal cord stimulator trial    Completed by other providers:   None at this time   Therapeutic  Palliative (PRN) options:   Palliative right CESI #3   Pharmacotherapy  The patient has been using the tramadol  to control her pain and she had developed an ulcer after having taken NSAIDs.  The patient was recommended to use Prilosec or Nexium on a regular basis.  Doing so and taking an occasional NSAID should be okay as long as she does not take it regularly.      Recent Visits No visits were found meeting these conditions. Showing recent visits within past 90 days and meeting all other requirements Future Appointments Date Type Provider Dept  06/15/23 Appointment Renaldo Caroli, MD [facility]  Showing future appointments within next 90 days and meeting all other requirements  I discussed the assessment and treatment plan with the patient. The patient was provided an opportunity to ask questions and all were answered. The patient agreed with the plan and  demonstrated an understanding of the instructions.  Patient advised to call back or seek an in-person evaluation if the symptoms or condition worsens.  Duration of encounter: *** minutes.  Total time on encounter, as per AMA guidelines included both the face-to-face and non-face-to-face time personally spent by the physician and/or other qualified health care professional(s) on the day of the encounter (includes time in activities that require the physician or other qualified health care professional and does not include time in activities normally performed by clinical staff). Physician's time may include the following activities when performed: Preparing to see the patient (e.g., pre-charting review of records, searching for previously ordered imaging, lab work, and nerve conduction tests) Review of prior analgesic pharmacotherapies. Reviewing PMP Interpreting  ordered tests (e.g., lab work, imaging, nerve conduction tests) Performing post-procedure evaluations, including interpretation of diagnostic procedures Obtaining and/or reviewing separately obtained history Performing a medically appropriate examination and/or evaluation Counseling and educating the patient/family/caregiver Ordering medications, tests, or procedures Referring and communicating with other health care professionals (when not separately reported) Documenting clinical information in the electronic or other health record Independently interpreting results (not separately reported) and communicating results to the patient/ family/caregiver Care coordination (not separately reported)  Note by: Candi Chafe, MD (TTS and AI technology used. I apologize for any typographical errors that were not detected and corrected.) Date: 06/15/2023; Time: 7:24 AM

## 2023-06-15 ENCOUNTER — Encounter: Payer: Self-pay | Admitting: Pain Medicine

## 2023-06-15 ENCOUNTER — Ambulatory Visit: Attending: Pain Medicine | Admitting: Pain Medicine

## 2023-06-15 VITALS — BP 165/62 | HR 74 | Resp 18 | Ht 61.0 in | Wt 145.0 lb

## 2023-06-15 DIAGNOSIS — M7522 Bicipital tendinitis, left shoulder: Secondary | ICD-10-CM | POA: Diagnosis present

## 2023-06-15 DIAGNOSIS — M25511 Pain in right shoulder: Secondary | ICD-10-CM | POA: Insufficient documentation

## 2023-06-15 DIAGNOSIS — Z9682 Presence of neurostimulator: Secondary | ICD-10-CM | POA: Diagnosis not present

## 2023-06-15 DIAGNOSIS — M792 Neuralgia and neuritis, unspecified: Secondary | ICD-10-CM

## 2023-06-15 DIAGNOSIS — G8929 Other chronic pain: Secondary | ICD-10-CM | POA: Diagnosis present

## 2023-06-15 DIAGNOSIS — G4486 Cervicogenic headache: Secondary | ICD-10-CM

## 2023-06-15 DIAGNOSIS — M5412 Radiculopathy, cervical region: Secondary | ICD-10-CM

## 2023-06-15 DIAGNOSIS — M25512 Pain in left shoulder: Secondary | ICD-10-CM | POA: Insufficient documentation

## 2023-06-15 DIAGNOSIS — Z9689 Presence of other specified functional implants: Secondary | ICD-10-CM

## 2023-06-15 DIAGNOSIS — M542 Cervicalgia: Secondary | ICD-10-CM

## 2023-06-15 NOTE — Patient Instructions (Signed)

## 2023-06-15 NOTE — Addendum Note (Signed)
 Addended by: Dametra Whetsel A on: 06/15/2023 10:19 AM   Modules accepted: Orders

## 2023-06-30 ENCOUNTER — Ambulatory Visit: Attending: Pain Medicine | Admitting: Pain Medicine

## 2023-06-30 ENCOUNTER — Ambulatory Visit
Admission: RE | Admit: 2023-06-30 | Discharge: 2023-06-30 | Disposition: A | Discharge status: Home / Self Care | Source: Ambulatory Visit | Attending: Pain Medicine | Admitting: Pain Medicine

## 2023-06-30 ENCOUNTER — Encounter: Payer: Self-pay | Admitting: Pain Medicine

## 2023-06-30 VITALS — BP 145/71 | HR 66 | Temp 97.2°F | Resp 14 | Ht 61.0 in | Wt 147.0 lb

## 2023-06-30 DIAGNOSIS — M7522 Bicipital tendinitis, left shoulder: Secondary | ICD-10-CM

## 2023-06-30 DIAGNOSIS — M25511 Pain in right shoulder: Secondary | ICD-10-CM | POA: Insufficient documentation

## 2023-06-30 DIAGNOSIS — Z9682 Presence of neurostimulator: Secondary | ICD-10-CM | POA: Diagnosis present

## 2023-06-30 DIAGNOSIS — M25612 Stiffness of left shoulder, not elsewhere classified: Secondary | ICD-10-CM | POA: Insufficient documentation

## 2023-06-30 DIAGNOSIS — M778 Other enthesopathies, not elsewhere classified: Secondary | ICD-10-CM | POA: Insufficient documentation

## 2023-06-30 DIAGNOSIS — G8929 Other chronic pain: Secondary | ICD-10-CM | POA: Diagnosis present

## 2023-06-30 DIAGNOSIS — M25611 Stiffness of right shoulder, not elsewhere classified: Secondary | ICD-10-CM | POA: Diagnosis present

## 2023-06-30 DIAGNOSIS — M25512 Pain in left shoulder: Secondary | ICD-10-CM | POA: Insufficient documentation

## 2023-06-30 DIAGNOSIS — M7592 Shoulder lesion, unspecified, left shoulder: Secondary | ICD-10-CM

## 2023-06-30 MED ORDER — MIDAZOLAM HCL 2 MG/2ML IJ SOLN: 0.5000 mg | Freq: Once | INTRAMUSCULAR | Status: DC

## 2023-06-30 MED ORDER — ROPIVACAINE HCL 2 MG/ML IJ SOLN
INTRAMUSCULAR | Status: AC
  Filled 2023-06-30: qty 20

## 2023-06-30 MED ORDER — LIDOCAINE HCL (PF) 2 % IJ SOLN
INTRAMUSCULAR | Status: AC
  Filled 2023-06-30: qty 10

## 2023-06-30 MED ORDER — ROPIVACAINE HCL 2 MG/ML IJ SOLN
9.0000 mL | Freq: Once | INTRAMUSCULAR | Status: AC
  Administered 2023-06-30: 9 mL via INTRA_ARTICULAR

## 2023-06-30 MED ORDER — METHYLPREDNISOLONE ACETATE 80 MG/ML IJ SUSP
80.0000 mg | Freq: Once | INTRAMUSCULAR | Status: AC
  Administered 2023-06-30: 80 mg via INTRA_ARTICULAR

## 2023-06-30 MED ORDER — METHYLPREDNISOLONE ACETATE 80 MG/ML IJ SUSP
INTRAMUSCULAR | Status: AC
Start: 2023-06-30 — End: ?
  Filled 2023-06-30: qty 1

## 2023-06-30 MED ORDER — LIDOCAINE HCL 2 % IJ SOLN
20.0000 mL | Freq: Once | INTRAMUSCULAR | Status: AC
  Administered 2023-06-30: 200 mg

## 2023-06-30 MED ORDER — PENTAFLUOROPROP-TETRAFLUOROETH EX AERO: INHALATION_SPRAY | Freq: Once | CUTANEOUS | Status: DC

## 2023-06-30 NOTE — Progress Notes (Signed)
 PROVIDER NOTE: Interpretation of information contained herein should be left to medically-trained personnel. Specific patient instructions are provided elsewhere under "Patient Instructions" section of medical record. This document was created in part using STT-dictation technology, any transcriptional errors that may result from this process are unintentional.  Patient: Alyssa Wood Type: Established DOB: 06-Aug-1960 MRN: [id number] PCP: [facility]  Service: Procedure DOS: 06/30/2023 Setting: Ambulatory Location: Ambulatory outpatient facility Delivery: Face-to-face Provider: Candi Chafe, MD Specialty: Interventional Pain Management Specialty designation: 09 Location: Outpatient facility Ref. Prov.: [facility]       Interventional Therapy   Procedure: Glenohumeral and acromioclavicular joint Injection #1  Laterality: Left (-LT)  Level: Shoulder  Target: Glenohumeral Joint (shoulder) Location: Intra-articular  Region: Entire Shoulder Area Approach: Anterior approach. Type of procedure: Percutaneous joint injection   Imaging: Fluoroscopy-guided Non-spinal ([id number]) Anesthesia: Local anesthesia (1-2% Lidocaine ) Anxiolysis: None                 Sedation: No Sedation                       DOS: 06/30/2023  Performed by: Candi Chafe, MD  Purpose: Diagnostic/Therapeutic Indications: Shoulder pain severe enough to impact quality of life or function. Rationale (medical necessity): procedure needed and proper for the diagnosis and/or treatment of Ms. Wood's medical symptoms and needs. 1. Chronic shoulder pain (2ry area of Pain) (Bilateral) (R>L)   2. Impaired range of motion of both shoulders   3. Biceps tendinitis of shoulder (Left)   4. Chronic shoulder pain (Left)   5. Enthesopathy of shoulder (Left)   6. Presence of neurostimulator (Cervical SCS)   7. Chronic pain of both shoulders   8. Biceps  tendinitis of left shoulder    NAS-11 Pain score:   Pre-procedure: 5 /10   Post-procedure: 5 /10       Position  Prep  Materials:  Position: Supine Prep solution: ChloraPrep (2% chlorhexidine  gluconate and 70% isopropyl alcohol) Prep Area: Entire shoulder Area Materials:  Tray: Block Needle(s):  Type: Spinal  Gauge (G): 22  Length: 3.5-in  Qty: 1  H&P (Pre-op Assessment):  Alyssa Wood is a 63 y.o. (year old), female patient, seen today for interventional treatment. She  has a past surgical history that includes Cholecystectomy (1992); Spinal cord stimulator implant (Right, Jan 2013); Shoulder surgery (Right); Wrist surgery (Right); Colonoscopy with propofol  (N/A, 08/28/2015); polypectomy (08/28/2015); Esophagogastroduodenoscopy (egd) with propofol  (N/A, 02/13/2021); Givens capsule study (N/A, 03/02/2021); Colonoscopy with propofol  (N/A, 02/15/2021); and Spinal cord stimulator battery exchange (N/A, 07/26/2022). Alyssa Wood has a current medication list which includes the following prescription(s): losartan-hydrochlorothiazide, metformin, omeprazole , rosuvastatin, sertraline, tramadol , and vitamin d  (ergocalciferol ), and the following Facility-Administered Medications: pentafluoroprop-tetrafluoroeth. Her primarily concern today is the Shoulder Pain (Left)  Initial Vital Signs:  Pulse/HCG Rate: 66ECG Heart Rate: 62 Temp: (!) 97.2 F (36.2 C) Resp: 15 BP: 134/65 SpO2: 99 %  BMI: Estimated body mass index is 27.78 kg/m as calculated from the following:   Height as of this encounter: 5\' 1"  (1.549 m).   Weight as of this encounter: 147 lb (66.7 kg).  Risk Assessment: Allergies: Reviewed. She is allergic to trazodone and nefazodone and hydrocodone.  Allergy  Precautions: None required Coagulopathies: Reviewed. None identified.  Blood-thinner therapy: None at this time Active Infection(s): Reviewed. None identified. Alyssa Wood is afebrile  Site Confirmation:  Alyssa Wood was asked to confirm the procedure and laterality before marking the site Procedure checklist: Completed  Consent: Before the procedure and under the influence of no sedative(s), amnesic(s), or anxiolytics, the patient was informed of the treatment options, risks and possible complications. To fulfill our ethical and legal obligations, as recommended by the American Medical Association's Code of Ethics, I have informed the patient of my clinical impression; the nature and purpose of the treatment or procedure; the risks, benefits, and possible complications of the intervention; the alternatives, including doing nothing; the risk(s) and benefit(s) of the alternative treatment(s) or procedure(s); and the risk(s) and benefit(s) of doing nothing. The patient was provided information about the general risks and possible complications associated with the procedure. These may include, but are not limited to: failure to achieve desired goals, infection, bleeding, organ or nerve damage, allergic reactions, paralysis, and death. In addition, the patient was informed of those risks and complications associated to the procedure, such as failure to decrease pain; infection; bleeding; organ or nerve damage with subsequent damage to sensory, motor, and/or autonomic systems, resulting in permanent pain, numbness, and/or weakness of one or several areas of the body; allergic reactions; (i.e.: anaphylactic reaction); and/or death. Furthermore, the patient was informed of those risks and complications associated with the medications. These include, but are not limited to: allergic reactions (i.e.: anaphylactic or anaphylactoid reaction(s)); adrenal axis suppression; blood sugar elevation that in diabetics may result in ketoacidosis or comma; water  retention that in patients with history of congestive heart failure may result in shortness of breath, pulmonary edema, and decompensation with resultant heart  failure; weight gain; swelling or edema; medication-induced neural toxicity; particulate matter embolism and blood vessel occlusion with resultant organ, and/or nervous system infarction; and/or aseptic necrosis of one or more joints. Finally, the patient was informed that Medicine is not an exact science; therefore, there is also the possibility of unforeseen or unpredictable risks and/or possible complications that may result in a catastrophic outcome. The patient indicated having understood very clearly. We have given the patient no guarantees and we have made no promises. Enough time was given to the patient to ask questions, all of which were answered to the patient's satisfaction. Ms. Cornick has indicated that she wanted to continue with the procedure. Attestation: I, the ordering provider, attest that I have discussed with the patient the benefits, risks, side-effects, alternatives, likelihood of achieving goals, and potential problems during recovery for the procedure that I have provided informed consent. Date  Time: 06/30/2023  8:11 AM   Imaging Guidance (Non-Spinal):          Type of Imaging Technique: Fluoroscopy Guidance (Non-Spinal) Indication(s): Fluoroscopy guidance for needle placement to enhance accuracy in procedures requiring precise needle localization for targeted delivery of medication in or near specific anatomical locations not easily accessible without such real-time imaging assistance. Exposure Time: Please see nurses notes. Contrast: None used. Fluoroscopic Guidance: I was personally present during the use of fluoroscopy. "Tunnel Vision Technique" used to obtain the best possible view of the target area. Parallax error corrected before commencing the procedure. "Direction-depth-direction" technique used to introduce the needle under continuous pulsed fluoroscopy. Once target was reached, antero-posterior, oblique, and lateral fluoroscopic projection used confirm  needle placement in all planes. Images permanently stored in EMR. Interpretation: No contrast injected. I personally interpreted the imaging intraoperatively. Adequate needle placement confirmed in multiple planes. Permanent images saved into the patient's record.  Pre-Procedure Preparation:  Monitoring: As per clinic protocol. Respiration, ETCO2, SpO2, BP, heart rate and rhythm monitor placed and checked for adequate function Safety Precautions: Patient was assessed  for positional comfort and pressure points before starting the procedure. Time-out: I initiated and conducted the "Time-out" before starting the procedure, as per protocol. The patient was asked to participate by confirming the accuracy of the "Time Out" information. Verification of the correct person, site, and procedure were performed and confirmed by me, the nursing staff, and the patient. "Time-out" conducted as per Joint Commission's Universal Protocol (UP.01.01.01). Time: 0912 Start Time: 0912 hrs.  Description  Narrative of Procedure:          Rationale (medical necessity): procedure needed and proper for the diagnosis and/or treatment of the patient's medical symptoms and needs. Procedural Technique Safety Precautions: Aspiration looking for blood return was conducted prior to all injections. At no point did we inject any substances, as a needle was being advanced. No attempts were made at seeking any paresthesias. Safe injection practices and needle disposal techniques used. Medications properly checked for expiration dates. SDV (single dose vial) medications used. Description of the Procedure: Protocol guidelines were followed. The patient was placed in position over the procedure table. The target area was identified and the area prepped in the usual manner. Skin & deeper tissues infiltrated with local anesthetic. Appropriate amount of time allowed to pass for local anesthetics to take effect. The procedure needles were then  advanced to the target area. Proper needle placement secured. Negative aspiration confirmed. Solution injected in intermittent fashion, asking for systemic symptoms every 0.5cc of injectate. The needles were then removed and the area cleansed, making sure to leave some of the prepping solution back to take advantage of its long term bactericidal properties.             Vitals:   06/30/23 0904 06/30/23 0909 06/30/23 0914 06/30/23 0918  BP: 137/67 (!) 144/70 (!) 143/83 (!) 145/71  Pulse:      Resp: 14 12 14 14   Temp:      TempSrc:      SpO2: 98% 100% 99% 100%  Weight:      Height:         Start Time: 0912 hrs. End Time: 0917 hrs.  Antibiotic Prophylaxis:   Anti-infectives (From admission, onward)    None      Indication(s): None identified   Post-operative Assessment:  Post-procedure Vital Signs:  Pulse/HCG Rate: 6664 Temp: (!) 97.2 F (36.2 C) Resp: 14 BP: (!) 145/71 SpO2: 100 %  EBL: None  Complications: No immediate post-treatment complications observed by team, or reported by patient.  Note: The patient tolerated the entire procedure well. A repeat set of vitals were taken after the procedure and the patient was kept under observation following institutional policy, for this type of procedure. Post-procedural neurological assessment was performed, showing return to baseline, prior to discharge. The patient was provided with post-procedure discharge instructions, including a section on how to identify potential problems. Should any problems arise concerning this procedure, the patient was given instructions to immediately contact us , at any time, without hesitation. In any case, we plan to contact the patient by telephone for a follow-up status report regarding this interventional procedure.  Comments:  No additional relevant information.  Plan of Care (POC)  Orders:  Orders Placed This Encounter  Procedures   SHOULDER INJECTION    Scheduling Instructions:      Procedure: Intra-articular shoulder (Glenohumeral) joint and (AC) Acromioclavicular joint injection     Side: Left-sided     Level: Glenohumeral joint and (AC) Acromioclavicular joint     Sedation: Patient's choice.  Timeframe: Today    Where will this procedure be performed?:   [facility]   [facility] C-ARM 1-60 MIN NO REPORT    Intraoperative interpretation by procedural physician at [facility].    Standing Status:   Standing    Number of Occurrences:   1    Reason for exam::   Assistance in needle guidance and placement for procedures requiring needle placement in or near specific anatomical locations not easily accessible without such assistance.   Informed Consent Details: Physician/Practitioner Attestation; Transcribe to consent form and obtain patient signature    Note: Always confirm laterality of pain with Ms. Wood, before procedure.    Physician/Practitioner attestation of informed consent for procedure/surgical case:   I, the physician/practitioner, attest that I have discussed with the patient the benefits, risks, side effects, alternatives, likelihood of achieving goals and potential problems during recovery for the procedure that I have provided informed consent.    Procedure:   Intra-articular shoulder joint injection under fluoroscopic guidance    Physician/Practitioner performing the procedure:   Vere Diantonio A. Barth Borne, MD    Indication/Reason:   Chronic shoulder pain secondary to shoulder arthropathy   Provide equipment / supplies at bedside    Procedure tray: "Block Tray" (Disposable  single use) Skin infiltration needle: Regular 1.5-in, 25-G, (x1) Block Needle type: Spinal Amount/quantity: 1 Size: Regular (3.5-inch) Gauge: 22G    Standing Status:   Standing    Number of Occurrences:   1    Specify:   Block Tray    Chronic Opioid Analgesic: No chronic opioid analgesics therapy prescribed by our practice. Tramadol  50 mg, 1 tab  PO QD (PRN) (50 mg/day of tramadol )  MME/day: 5 mg/day.   Medications ordered for procedure: Meds ordered this encounter  Medications   lidocaine  (XYLOCAINE ) 2 % (with pres) injection 400 mg   pentafluoroprop-tetrafluoroeth (GEBAUERS) aerosol   DISCONTD: midazolam  (VERSED ) injection 0.5-2 mg    Make sure Flumazenil is available in the pyxis when using this medication. If oversedation occurs, administer 0.2 mg IV over 15 sec. If after 45 sec no response, administer 0.2 mg again over 1 min; may repeat at 1 min intervals; not to exceed 4 doses (1 mg)   ropivacaine  (PF) 2 mg/mL (0.2%) (NAROPIN ) injection 9 mL   methylPREDNISolone  acetate (DEPO-MEDROL ) injection 80 mg   Medications administered: We administered lidocaine , ropivacaine  (PF) 2 mg/mL (0.2%), and methylPREDNISolone  acetate.  See the medical record for exact dosing, route, and time of administration.  Follow-up plan:   Return in about 2 weeks (around 07/14/2023) for (Face2F), (PPE).       Interventional Therapies  Risk Factors  Considerations:  Anxiety  Depression  GERD  HTN  Hx of upper GI bleed  Fractured SCS lead              Planned  Pending:   Diagnostic/therapeutic left IA shoulder injection #1    Under consideration:   Diagnostic bilateral cervical facet MBB #1  Diagnostic/therapeutic right hand, middle trigger finger injection #1  Therapeutic bilateral suprascapular NB #2  Therapeutic bilateral suprascapular nerve RFA #1    Completed:   Diagnostic bilateral suprascapular NB x1 (03/09/2022) (100/100/80/90)  Therapeutic right C7-T1 CESI x2 (08/04/2017) (100/100/100 x 1 day/60/>75)  Diagnostic cervical spinal cord stimulator trial    Completed by other providers:   None at this time   Therapeutic  Palliative (PRN) options:   Palliative right CESI #3   Pharmacotherapy  The patient has been  using the tramadol  to control her pain and she had developed an ulcer after having taken NSAIDs.  The patient  was recommended to use Prilosec or Nexium on a regular basis.  Doing so and taking an occasional NSAID should be okay as long as she does not take it regularly.       Recent Visits Date Type Provider Dept  06/15/23 Office Visit Renaldo Caroli, MD [facility]  Showing recent visits within past 90 days and meeting all other requirements Today's Visits Date Type Provider Dept  06/30/23 Procedure visit Renaldo Caroli, MD [facility]  Showing today's visits and meeting all other requirements Future Appointments Date Type Provider Dept  07/20/23 Appointment Renaldo Caroli, MD [facility]  Showing future appointments within next 90 days and meeting all other requirements  Disposition: Discharge home  Discharge (Date  Time): 06/30/2023; 0923 hrs.   Primary Care Physician: [facility] Location: [facility] Note by: Candi Chafe, MD (TTS technology used. I apologize for any typographical errors that were not detected and corrected.) Date: 06/30/2023; Time: 10:25 AM  Disclaimer:  Medicine is not an Visual merchandiser. The only guarantee in medicine is that nothing is guaranteed. It is important to note that the decision to proceed with this intervention was based on the information collected from the patient. The Data and conclusions were drawn from the patient's questionnaire, the interview, and the physical examination. Because the information was provided in large part by the patient, it cannot be guaranteed that it has not been purposely or unconsciously manipulated. Every effort has been made to obtain as much relevant data as possible for this evaluation. It is important to note that the conclusions that lead to this procedure are derived in large part from the available data. Always take into account that the treatment will also be dependent on availability of resources and existing treatment  guidelines, considered by other Pain Management Practitioners as being common knowledge and practice, at the time of the intervention. For Medico-Legal purposes, it is also important to point out that variation in procedural techniques and pharmacological choices are the acceptable norm. The indications, contraindications, technique, and results of the above procedure should only be interpreted and judged by a Board-Certified Interventional Pain Specialist with extensive familiarity and expertise in the same exact procedure and technique.

## 2023-06-30 NOTE — Patient Instructions (Signed)

## 2023-07-01 ENCOUNTER — Telehealth: Payer: Self-pay

## 2023-07-01 NOTE — Telephone Encounter (Signed)
 Called PP"I am doingVERy good, good, goo" Instructed to call if needed.

## 2023-07-19 NOTE — Progress Notes (Unsigned)
 PROVIDER NOTE: Interpretation of information contained herein should be left to medically-trained personnel. Specific patient instructions are provided elsewhere under "Patient Instructions" section of medical record. This document was created in part using AI and STT-dictation technology, any transcriptional errors that may result from this process are unintentional.  Patient: Alyssa Wood  Service: E/M   PCP: [facility]  DOB: Dec 24, 1960  DOS: 07/20/2023  Provider: Candi Chafe, MD  MRN: [id number]  Delivery: Face-to-face  Specialty: Interventional Pain Management  Type: Established Patient  Setting: Ambulatory outpatient facility  Specialty designation: 09  Referring Prov.: [facility]  Location: Outpatient office facility       History of present illness (HPI) [name], a 63 y.o. year old female, is here today because of her Chronic pain of both shoulders [M25.511, G89.29, M25.512]. Alyssa Wood's primary complain today is No chief complaint on file.  Pertinent problems: Alyssa Wood has Chronic neck pain (1ry area of Pain) (Bilateral) (R>L); Chronic low back pain (4th area of Pain) (Bilateral) (L>R) w/o sciatica; Chronic upper extremity pain (Bilateral) (R>L); Chronic pain syndrome; Chronic shoulder pain (2ry area of Pain) (Bilateral) (R>L); Cervicogenic headache (3ry area of Pain); Cervical Spinal cord stimulator (Fractured Tip); Osteoarthritis; Chronic musculoskeletal pain; Chronic cervical radiculitis (Bilateral) (R>L); Cervical spondylosis with radiculopathy (Bilateral); Chronic shoulder radicular pain (Bilateral); DDD (degenerative disc disease), cervical; Cervicalgia (1ry area of Pain) (Bilateral) (R>L); Neurogenic pain; Chronic neuropathic pain; Chronic hand pain (Bilateral) (R>L); Chronic trigger finger, middle finger (Right); Cervical myofascial pain syndrome; DDD (degenerative disc disease), lumbar;  Low back pain of over 3 months duration; Chronic upper back pain; Thoracic radiculopathy (Right); Chronic pain of right upper extremity; Radicular pain of right upper extremity; Chronic right shoulder pain; Impaired range of motion of both shoulders; Battery end of life of spinal cord stimulator; Lumbar facet joint pain; Lumbar facet arthropathy; Acute neck pain (Left); Acute pain of left shoulder; Presence of neurostimulator (Cervical SCS); Biceps tendinitis of shoulder (Left); Chronic shoulder pain (Left); and Enthesopathy of shoulder (Left) on their pertinent problem list.  Pain Assessment: Severity of   is reported as a  /10. Location:    / . Onset:  . Quality:  . Timing:  . Modifying factor(s):  Aaron Aas Vitals:  vitals were not taken for this visit.  BMI: Estimated body mass index is 27.78 kg/m as calculated from the following:   Height as of 06/30/23: 5\' 1"  (1.549 m).   Weight as of 06/30/23: 147 lb (66.7 kg).  Last encounter: 06/15/2023. Last procedure: 06/30/2023.  Reason for encounter: post-procedure evaluation and assessment.   Discussed the use of AI scribe software for clinical note transcription with the patient, who gave verbal consent to proceed.  History of Present Illness         Post-Procedure Evaluation   Procedure: Glenohumeral and acromioclavicular joint Injection #1  Laterality: Left (-LT)  Level: Shoulder  Target: Glenohumeral Joint (shoulder) Location: Intra-articular  Region: Entire Shoulder Area Approach: Anterior approach. Type of procedure: Percutaneous joint injection   Imaging: Fluoroscopy-guided Non-spinal ([id number]) Anesthesia: Local anesthesia (1-2% Lidocaine ) Anxiolysis: None                 Sedation: No Sedation                       DOS: 06/30/2023  Performed by: Candi Chafe, MD  Purpose: Diagnostic/Therapeutic Indications: Shoulder pain severe enough to impact quality of life or function. Rationale (medical  necessity): procedure needed and  proper for the diagnosis and/or treatment of Alyssa Wood's medical symptoms and needs. 1. Chronic shoulder pain (2ry area of Pain) (Bilateral) (R>L)   2. Impaired range of motion of both shoulders   3. Biceps tendinitis of shoulder (Left)   4. Chronic shoulder pain (Left)   5. Enthesopathy of shoulder (Left)   6. Presence of neurostimulator (Cervical SCS)   7. Chronic pain of both shoulders   8. Biceps tendinitis of left shoulder    NAS-11 Pain score:   Pre-procedure: 5 /10   Post-procedure: 5 /10       Effectiveness:  Initial hour after procedure:   ***. Subsequent 4-6 hours post-procedure:   ***. Analgesia past initial 6 hours:   ***. Ongoing improvement:  Analgesic:  *** Function:    ***    ROM:    ***      Pharmacotherapy Assessment   Opioid Analgesic: No chronic opioid analgesics therapy prescribed by our practice. Tramadol  50 mg, 1 tab PO QD (PRN) (50 mg/day of tramadol )  MME/day: 5 mg/day.   Monitoring: [place] PMP: PDMP reviewed during this encounter.       Pharmacotherapy: No side-effects or adverse reactions reported. Compliance: No problems identified. Effectiveness: Clinically acceptable.  No notes on file  UDS:  Summary  Date Value Ref Range Status  07/03/2019 Note  Final    Comment:    ==================================================================== [employer] Select 13 (MW) ==================================================================== Test                             Result       Flag       Units Drug Present and Declared for Prescription Verification   Tramadol                        >6024        EXPECTED   ng/mg creat   O-Desmethyltramadol            >6024        EXPECTED   ng/mg creat   N-Desmethyltramadol            541          EXPECTED   ng/mg creat    Source of tramadol  is a prescription medication. O-desmethyltramadol    and N-desmethyltramadol are expected metabolites of  tramadol . ==================================================================== Test                      Result    Flag   Units      Ref Range   Creatinine              83               mg/dL      >=16 ==================================================================== Declared Medications:  The flagging and interpretation on this report are based on the  following declared medications.  Unexpected results may arise from  inaccuracies in the declared medications.  **Note: The testing scope of this panel includes these medications:  Tramadol  (Ultram )  **Note: The testing scope of this panel does not include the  following reported medications:  Calcium   Cyanocobalamin  Gabapentin  (Neurontin )  Hydrochlorothiazide (Zestoretic)  Lisinopril (Zestoretic)  Meloxicam  (Mobic )  Metformin (Glucophage)  Omega-3 Fatty Acids  Omeprazole  (Prilosec)  Sertraline (Zoloft)  Topical  Vitamin D  ==================================================================== For clinical consultation, please call [phone]. ====================================================================     No  results found for: "CBDTHCR" No results found for: "D8THCCBX" No results found for: "D9THCCBX"  ROS  Constitutional: Denies any fever or chills Gastrointestinal: No reported hemesis, hematochezia, vomiting, or acute GI distress Musculoskeletal: Denies any acute onset joint swelling, redness, loss of ROM, or weakness Neurological: No reported episodes of acute onset apraxia, aphasia, dysarthria, agnosia, amnesia, paralysis, loss of coordination, or loss of consciousness  Medication Review  Vitamin D  (Ergocalciferol ), losartan-hydrochlorothiazide, metFORMIN, omeprazole , rosuvastatin, sertraline, and traMADol   History Review  Allergy : Alyssa Wood is allergic to trazodone and nefazodone and hydrocodone. Drug: Alyssa Wood  reports no history of drug use. Alcohol:  reports current alcohol  use of about 1.0 standard drink of alcohol per week. Tobacco:  reports that she has never smoked. She has never used smokeless tobacco. Social: Alyssa Wood  reports that she has never smoked. She has never used smokeless tobacco. She reports current alcohol use of about 1.0 standard drink of alcohol per week. She reports that she does not use drugs. Medical:  has a past medical history of Anxiety, Chronic pain, Depression, Diabetes mellitus without complication (HCC), GERD (gastroesophageal reflux disease), and Hypertension. Surgical: Alyssa Wood  has a past surgical history that includes Cholecystectomy (1992); Spinal cord stimulator implant (Right, Jan 2013); Shoulder surgery (Right); Wrist surgery (Right); Colonoscopy with propofol  (N/A, 08/28/2015); polypectomy (08/28/2015); Esophagogastroduodenoscopy (egd) with propofol  (N/A, 02/13/2021); Givens capsule study (N/A, 03/02/2021); Colonoscopy with propofol  (N/A, 02/15/2021); and Spinal cord stimulator battery exchange (N/A, 07/26/2022). Family: family history includes Cancer in her father; Heart disease in her father.  Laboratory Chemistry Profile   Renal Lab Results  Component Value Date   BUN 14 07/20/2022   CREATININE 0.86 07/20/2022   BCR 19 01/11/2017   GFRAA >60 08/16/2019   GFRNONAA >60 07/20/2022    Hepatic Lab Results  Component Value Date   AST 32 09/23/2021   ALT 24 09/23/2021   ALBUMIN 4.1 09/23/2021   ALKPHOS 116 09/23/2021   LIPASE 32 02/12/2021    Electrolytes Lab Results  Component Value Date   NA 134 (L) 07/20/2022   K 3.5 07/20/2022   CL 100 07/20/2022   CALCIUM  9.4 07/20/2022   MG 2.3 01/11/2017    Bone Lab Results  Component Value Date   25OHVITD1 22 (L) 01/11/2017   25OHVITD2 2.5 01/11/2017   25OHVITD3 19 01/11/2017    Inflammation (CRP: Acute Phase) (ESR: Chronic Phase) Lab Results  Component Value Date   CRP 7 02/22/2022   ESRSEDRATE 37 02/22/2022         Note: Above Lab results  reviewed.  Recent Imaging Review  DG PAIN CLINIC C-ARM 1-60 MIN NO REPORT Fluoro was used, but no Radiologist interpretation will be provided.  Please refer to "NOTES" tab for provider progress note. Note: Reviewed         Physical Exam  General appearance: Well nourished, well developed, and well hydrated. In no apparent acute distress Mental status: Alert, oriented x 3 (person, place, & time)       Respiratory: No evidence of acute respiratory distress Eyes: PERLA Vitals: There were no vitals taken for this visit. BMI: Estimated body mass index is 27.78 kg/m as calculated from the following:   Height as of 06/30/23: 5\' 1"  (1.549 m).   Weight as of 06/30/23: 147 lb (66.7 kg). Ideal: Patient weight not recorded  Assessment   Diagnosis Status  1. Chronic shoulder pain (2ry area of Pain) (Bilateral) (R>L)   2. Impaired range of motion of both shoulders  3. Biceps tendinitis of shoulder (Left)   4. Chronic shoulder pain (Left)   5. Enthesopathy of shoulder (Left)   6. Presence of neurostimulator (Cervical SCS)   7. Postop check    Controlled Controlled Controlled   Updated Problems: No problems updated.  Plan of Care  Problem-specific:  Assessment and Plan            Alyssa Wood has a current medication list which includes the following long-term medication(s): losartan-hydrochlorothiazide, metformin, omeprazole , and rosuvastatin.  Pharmacotherapy (Medications Ordered): No orders of the defined types were placed in this encounter.  Orders:  No orders of the defined types were placed in this encounter.    Interventional Therapies  Risk Factors  Considerations:  Anxiety  Depression  GERD  HTN  Hx of upper GI bleed  Fractured SCS lead              Planned  Pending:   Diagnostic/therapeutic left IA shoulder injection #1    Under consideration:   Diagnostic bilateral cervical facet MBB #1  Diagnostic/therapeutic right hand, middle  trigger finger injection #1  Therapeutic bilateral suprascapular NB #2  Therapeutic bilateral suprascapular nerve RFA #1    Completed:   Diagnostic bilateral suprascapular NB x1 (03/09/2022) (100/100/80/90)  Therapeutic right C7-T1 CESI x2 (08/04/2017) (100/100/100 x 1 day/60/>75)  Diagnostic cervical spinal cord stimulator trial    Completed by other providers:   None at this time   Therapeutic  Palliative (PRN) options:   Palliative right CESI #3   Pharmacotherapy  The patient has been using the tramadol  to control her pain and she had developed an ulcer after having taken NSAIDs.  The patient was recommended to use Prilosec or Nexium on a regular basis.  Doing so and taking an occasional NSAID should be okay as long as she does not take it regularly.      No follow-ups on file.    Recent Visits Date Type Provider Dept  06/30/23 Procedure visit Renaldo Caroli, MD [facility]  06/15/23 Office Visit Renaldo Caroli, MD [facility]  Showing recent visits within past 90 days and meeting all other requirements Future Appointments Date Type Provider Dept  07/20/23 Appointment Renaldo Caroli, MD [facility]  Showing future appointments within next 90 days and meeting all other requirements  I discussed the assessment and treatment plan with the patient. The patient was provided an opportunity to ask questions and all were answered. The patient agreed with the plan and demonstrated an understanding of the instructions.  Patient advised to call back or seek an in-person evaluation if the symptoms or condition worsens.  Duration of encounter: *** minutes.  Total time on encounter, as per AMA guidelines included both the face-to-face and non-face-to-face time personally spent by the physician and/or other qualified health care professional(s) on the day of the encounter (includes time in activities that require the physician or other qualified  health care professional and does not include time in activities normally performed by clinical staff). Physician's time may include the following activities when performed: Preparing to see the patient (e.g., pre-charting review of records, searching for previously ordered imaging, lab work, and nerve conduction tests) Review of prior analgesic pharmacotherapies. Reviewing PMP Interpreting ordered tests (e.g., lab work, imaging, nerve conduction tests) Performing post-procedure evaluations, including interpretation of diagnostic procedures Obtaining and/or reviewing separately obtained history Performing a medically appropriate examination and/or evaluation Counseling and educating the patient/family/caregiver Ordering medications, tests, or procedures Referring and communicating with  other health care professionals (when not separately reported) Documenting clinical information in the electronic or other health record Independently interpreting results (not separately reported) and communicating results to the patient/ family/caregiver Care coordination (not separately reported)  Note by: Candi Chafe, MD (TTS and AI technology used. I apologize for any typographical errors that were not detected and corrected.) Date: 07/20/2023; Time: 5:13 PM

## 2023-07-20 ENCOUNTER — Ambulatory Visit (HOSPITAL_BASED_OUTPATIENT_CLINIC_OR_DEPARTMENT_OTHER): Admitting: Pain Medicine

## 2023-07-20 DIAGNOSIS — M778 Other enthesopathies, not elsewhere classified: Secondary | ICD-10-CM

## 2023-07-20 DIAGNOSIS — Z09 Encounter for follow-up examination after completed treatment for conditions other than malignant neoplasm: Secondary | ICD-10-CM

## 2023-07-20 DIAGNOSIS — M7522 Bicipital tendinitis, left shoulder: Secondary | ICD-10-CM

## 2023-07-20 DIAGNOSIS — Z91199 Patient's noncompliance with other medical treatment and regimen due to unspecified reason: Secondary | ICD-10-CM

## 2023-07-20 DIAGNOSIS — M25612 Stiffness of left shoulder, not elsewhere classified: Secondary | ICD-10-CM

## 2023-07-20 DIAGNOSIS — G8929 Other chronic pain: Secondary | ICD-10-CM

## 2023-07-20 DIAGNOSIS — Z9682 Presence of neurostimulator: Secondary | ICD-10-CM

## 2023-07-26 ENCOUNTER — Ambulatory Visit
Admission: RE | Admit: 2023-07-26 | Discharge: 2023-07-26 | Disposition: A | Discharge status: Home / Self Care | Source: Ambulatory Visit | Attending: Family Medicine | Admitting: Family Medicine

## 2023-07-26 DIAGNOSIS — Z1231 Encounter for screening mammogram for malignant neoplasm of breast: Secondary | ICD-10-CM | POA: Diagnosis present

## 2023-07-31 NOTE — Progress Notes (Unsigned)
 PROVIDER NOTE: Interpretation of information contained herein should be left to medically-trained personnel. Specific patient instructions are provided elsewhere under Patient Instructions section of medical record. This document was created in part using AI and STT-dictation technology, any transcriptional errors that may result from this process are unintentional.  Patient: Alyssa Wood  Service: E/M   PCP: Tobie Domino, MD  DOB: June 07, 1960  DOS: 08/01/2023  Provider: Eric DELENA Como, MD  MRN: [id number]  Delivery: Face-to-face  Specialty: Interventional Pain Management  Type: Established Patient  Setting: Ambulatory outpatient facility  Specialty designation: 09  Referring Prov.: [facility]  Location: Outpatient office facility       History of present illness (HPI) Alyssa Wood, a 63 y.o. year old female, is here today because of her Chronic left shoulder pain [M25.512, G89.29]. Alyssa Wood's primary complain today is No chief complaint on file.  Pertinent problems: Alyssa Wood has Chronic neck pain (1ry area of Pain) (Bilateral) (R>L); Chronic low back pain (4th area of Pain) (Bilateral) (L>R) w/o sciatica; Chronic upper extremity pain (Bilateral) (R>L); Chronic pain syndrome; Chronic shoulder pain (2ry area of Pain) (Bilateral) (R>L); Cervicogenic headache (3ry area of Pain); Cervical Spinal cord stimulator (Fractured Tip); Osteoarthritis; Chronic musculoskeletal pain; Chronic cervical radiculitis (Bilateral) (R>L); Cervical spondylosis with radiculopathy (Bilateral); Chronic shoulder radicular pain (Bilateral); DDD (degenerative disc disease), cervical; Cervicalgia (1ry area of Pain) (Bilateral) (R>L); Neurogenic pain; Chronic neuropathic pain; Chronic hand pain (Bilateral) (R>L); Chronic trigger finger, middle finger (Right); Cervical myofascial pain syndrome; DDD (degenerative disc disease), lumbar; Low back pain of over 3 months  duration; Chronic upper back pain; Thoracic radiculopathy (Right); Chronic pain of right upper extremity; Radicular pain of right upper extremity; Chronic right shoulder pain; Impaired range of motion of both shoulders; Battery end of life of spinal cord stimulator; Lumbar facet joint pain; Lumbar facet arthropathy; Acute neck pain (Left); Acute pain of left shoulder; Presence of neurostimulator (Cervical SCS); Biceps tendinitis of shoulder (Left); Chronic shoulder pain (Left); and Enthesopathy of shoulder (Left) on their pertinent problem list.  Pain Assessment: Severity of   is reported as a  /10. Location:    / . Onset:  . Quality:  . Timing:  . Modifying factor(s):  SABRA Vitals:  vitals were not taken for this visit.  BMI: Estimated body mass index is 27.78 kg/m as calculated from the following:   Height as of 06/30/23: 5' 1 (1.549 m).   Weight as of 06/30/23: 147 lb (66.7 kg).  Last encounter: 07/20/2023. Last procedure: 06/30/2023.  Reason for encounter: post-procedure evaluation and assessment.   Discussed the use of AI scribe software for clinical note transcription with the patient, who gave verbal consent to proceed.  History of Present Illness         Post-Procedure Evaluation   Procedure: Glenohumeral and acromioclavicular joint Injection #1  Laterality: Left (-LT)  Level: Shoulder  Target: Glenohumeral Joint (shoulder) Location: Intra-articular  Region: Entire Shoulder Area Approach: Anterior approach. Type of procedure: Percutaneous joint injection   Imaging: Fluoroscopy-guided Non-spinal ([id number]) Anesthesia: Local anesthesia (1-2% Lidocaine ) Anxiolysis: None                 Sedation: No Sedation                       DOS: 06/30/2023  Performed by: Eric DELENA Como, MD  Purpose: Diagnostic/Therapeutic Indications: Shoulder pain severe enough to impact quality of life or function. Rationale (medical necessity): procedure needed and  proper for the diagnosis and/or  treatment of Alyssa Wood's medical symptoms and needs. 1. Chronic shoulder pain (2ry area of Pain) (Bilateral) (R>L)   2. Impaired range of motion of both shoulders   3. Biceps tendinitis of shoulder (Left)   4. Chronic shoulder pain (Left)   5. Enthesopathy of shoulder (Left)   6. Presence of neurostimulator (Cervical SCS)   7. Chronic pain of both shoulders   8. Biceps tendinitis of left shoulder    NAS-11 Pain score:   Pre-procedure: 5 /10   Post-procedure: 5 /10       Effectiveness:  Initial hour after procedure:   ***. Subsequent 4-6 hours post-procedure:   ***. Analgesia past initial 6 hours:   ***. Ongoing improvement:  Analgesic:  *** Function:    ***    ROM:    ***      Pharmacotherapy Assessment   Analgesic: No chronic opioid analgesics therapy prescribed by our practice. Tramadol  50 mg, 1 tab PO QD (PRN) (50 mg/day of tramadol )  MME/day: 5 mg/day.   Monitoring: [place] PMP: PDMP reviewed during this encounter.       Pharmacotherapy: No side-effects or adverse reactions reported. Compliance: No problems identified. Effectiveness: Clinically acceptable.  No notes on file  UDS:  Summary  Date Value Ref Range Status  07/03/2019 Note  Final    Comment:    ==================================================================== [employer] Select 13 (MW) ==================================================================== Test                             Result       Flag       Units Drug Present and Declared for Prescription Verification   Tramadol                        >6024        EXPECTED   ng/mg creat   O-Desmethyltramadol            >6024        EXPECTED   ng/mg creat   N-Desmethyltramadol            541          EXPECTED   ng/mg creat    Source of tramadol  is a prescription medication. O-desmethyltramadol    and N-desmethyltramadol are expected metabolites of tramadol . ==================================================================== Test                       Result    Flag   Units      Ref Range   Creatinine              83               mg/dL      >=79 ==================================================================== Declared Medications:  The flagging and interpretation on this report are based on the  following declared medications.  Unexpected results may arise from  inaccuracies in the declared medications.  **Note: The testing scope of this panel includes these medications:  Tramadol  (Ultram )  **Note: The testing scope of this panel does not include the  following reported medications:  Calcium   Cyanocobalamin  Gabapentin  (Neurontin )  Hydrochlorothiazide (Zestoretic)  Lisinopril (Zestoretic)  Meloxicam  (Mobic )  Metformin (Glucophage)  Omega-3 Fatty Acids  Omeprazole  (Prilosec)  Sertraline (Zoloft)  Topical  Vitamin D  ==================================================================== For clinical consultation, please call [phone]. ====================================================================     No results found for: CBDTHCR No  results found for: D8THCCBX No results found for: D9THCCBX  ROS  Constitutional: Denies any fever or chills Gastrointestinal: No reported hemesis, hematochezia, vomiting, or acute GI distress Musculoskeletal: Denies any acute onset joint swelling, redness, loss of ROM, or weakness Neurological: No reported episodes of acute onset apraxia, aphasia, dysarthria, agnosia, amnesia, paralysis, loss of coordination, or loss of consciousness  Medication Review  Vitamin D  (Ergocalciferol ), losartan-hydrochlorothiazide, metFORMIN, omeprazole , rosuvastatin, sertraline, and traMADol   History Review  Allergy : Alyssa Wood is allergic to trazodone and nefazodone and hydrocodone. Drug: Alyssa Wood  reports no history of drug use. Alcohol:  reports current alcohol use of about 1.0 standard drink of alcohol per week. Tobacco:  reports that she has never  smoked. She has never used smokeless tobacco. Social: Alyssa Wood  reports that she has never smoked. She has never used smokeless tobacco. She reports current alcohol use of about 1.0 standard drink of alcohol per week. She reports that she does not use drugs. Medical:  has a past medical history of Anxiety, Chronic pain, Depression, Diabetes mellitus without complication (HCC), GERD (gastroesophageal reflux disease), and Hypertension. Surgical: Alyssa Wood  has a past surgical history that includes Cholecystectomy (1992); Spinal cord stimulator implant (Right, Jan 2013); Shoulder surgery (Right); Wrist surgery (Right); Colonoscopy with propofol  (N/A, 08/28/2015); polypectomy (08/28/2015); Esophagogastroduodenoscopy (egd) with propofol  (N/A, 02/13/2021); Givens capsule study (N/A, 03/02/2021); Colonoscopy with propofol  (N/A, 02/15/2021); and Spinal cord stimulator battery exchange (N/A, 07/26/2022). Family: family history includes Cancer in her father; Heart disease in her father.  Laboratory Chemistry Profile   Renal Lab Results  Component Value Date   BUN 14 07/20/2022   CREATININE 0.86 07/20/2022   BCR 19 01/11/2017   GFRAA >60 08/16/2019   GFRNONAA >60 07/20/2022    Hepatic Lab Results  Component Value Date   AST 32 09/23/2021   ALT 24 09/23/2021   ALBUMIN 4.1 09/23/2021   ALKPHOS 116 09/23/2021   LIPASE 32 02/12/2021    Electrolytes Lab Results  Component Value Date   NA 134 (L) 07/20/2022   K 3.5 07/20/2022   CL 100 07/20/2022   CALCIUM  9.4 07/20/2022   MG 2.3 01/11/2017    Bone Lab Results  Component Value Date   25OHVITD1 22 (L) 01/11/2017   25OHVITD2 2.5 01/11/2017   25OHVITD3 19 01/11/2017    Inflammation (CRP: Acute Phase) (ESR: Chronic Phase) Lab Results  Component Value Date   CRP 7 02/22/2022   ESRSEDRATE 37 02/22/2022         Note: Above Lab results reviewed.  Recent Imaging Review  MM 3D SCREENING MAMMOGRAM BILATERAL BREAST CLINICAL  DATA:  Screening.  EXAM: DIGITAL SCREENING BILATERAL MAMMOGRAM WITH TOMOSYNTHESIS AND CAD  TECHNIQUE: Bilateral screening digital craniocaudal and mediolateral oblique mammograms were obtained. Bilateral screening digital breast tomosynthesis was performed. The images were evaluated with computer-aided detection.  COMPARISON:  Previous exam(s).  ACR Breast Density Category b: There are scattered areas of fibroglandular density.  FINDINGS: There are no findings suspicious for malignancy.  IMPRESSION: No mammographic evidence of malignancy. A result letter of this screening mammogram will be mailed directly to the patient.  RECOMMENDATION: Screening mammogram in one year. (Code:SM-B-01Y)  BI-RADS CATEGORY  1: Negative.  Electronically Signed   By: Alm Parkins M.D.   On: 07/28/2023 10:58 Note: Reviewed        Physical Exam  General appearance: Well nourished, well developed, and well hydrated. In no apparent acute distress Mental status: Alert, oriented x 3 (person, place, & time)  Respiratory: No evidence of acute respiratory distress Eyes: PERLA Vitals: There were no vitals taken for this visit. BMI: Estimated body mass index is 27.78 kg/m as calculated from the following:   Height as of 06/30/23: 5' 1 (1.549 m).   Weight as of 06/30/23: 147 lb (66.7 kg). Ideal: Patient weight not recorded  Assessment   Diagnosis Status  1. Chronic shoulder pain (Left)   2. Biceps tendinitis of shoulder (Left)   3. Enthesopathy of shoulder (Left)   4. Impaired range of motion of both shoulders   5. Postop check    Controlled Controlled Controlled   Updated Problems: No problems updated.  Plan of Care  Problem-specific:  Assessment and Plan            Alyssa Wood has a current medication list which includes the following long-term medication(s): losartan-hydrochlorothiazide, metformin, omeprazole , and rosuvastatin.  Pharmacotherapy (Medications  Ordered): No orders of the defined types were placed in this encounter.  Orders:  No orders of the defined types were placed in this encounter.    Interventional Therapies  Risk Factors  Considerations:  Anxiety  Depression  GERD  HTN  Hx of upper GI bleed  Fractured SCS lead              Planned  Pending:   Diagnostic/therapeutic left IA shoulder injection #1    Under consideration:   Diagnostic bilateral cervical facet MBB #1  Diagnostic/therapeutic right hand, middle trigger finger injection #1  Therapeutic bilateral suprascapular NB #2  Therapeutic bilateral suprascapular nerve RFA #1    Completed:   Diagnostic bilateral suprascapular NB x1 (03/09/2022) (100/100/80/90)  Therapeutic right C7-T1 CESI x2 (08/04/2017) (100/100/100 x 1 day/60/>75)  Diagnostic cervical spinal cord stimulator trial    Completed by other providers:   None at this time   Therapeutic  Palliative (PRN) options:   Palliative right CESI #3   Pharmacotherapy  The patient has been using the tramadol  to control her pain and she had developed an ulcer after having taken NSAIDs.  The patient was recommended to use Prilosec or Nexium on a regular basis.  Doing so and taking an occasional NSAID should be okay as long as she does not take it regularly.      No follow-ups on file.    Recent Visits Date Type Provider Dept  06/30/23 Procedure visit Tanya Glisson, MD [facility]  06/15/23 Office Visit Tanya Glisson, MD [facility]  Showing recent visits within past 90 days and meeting all other requirements Future Appointments Date Type Provider Dept  08/01/23 Appointment Tanya Glisson, MD [facility]  Showing future appointments within next 90 days and meeting all other requirements  I discussed the assessment and treatment plan with the patient. The patient was provided an opportunity to ask questions and all were answered. The patient agreed  with the plan and demonstrated an understanding of the instructions.  Patient advised to call back or seek an in-person evaluation if the symptoms or condition worsens.  Duration of encounter: *** minutes.  Total time on encounter, as per AMA guidelines included both the face-to-face and non-face-to-face time personally spent by the physician and/or other qualified health care professional(s) on the day of the encounter (includes time in activities that require the physician or other qualified health care professional and does not include time in activities normally performed by clinical staff). Physician's time may include the following activities when performed: Preparing to see the patient (e.g., pre-charting review  of records, searching for previously ordered imaging, lab work, and nerve conduction tests) Review of prior analgesic pharmacotherapies. Reviewing PMP Interpreting ordered tests (e.g., lab work, imaging, nerve conduction tests) Performing post-procedure evaluations, including interpretation of diagnostic procedures Obtaining and/or reviewing separately obtained history Performing a medically appropriate examination and/or evaluation Counseling and educating the patient/family/caregiver Ordering medications, tests, or procedures Referring and communicating with other health care professionals (when not separately reported) Documenting clinical information in the electronic or other health record Independently interpreting results (not separately reported) and communicating results to the patient/ family/caregiver Care coordination (not separately reported)  Note by: Eric DELENA Como, MD (TTS and AI technology used. I apologize for any typographical errors that were not detected and corrected.) Date: 08/01/2023; Time: 11:28 AM

## 2023-08-01 ENCOUNTER — Ambulatory Visit (HOSPITAL_BASED_OUTPATIENT_CLINIC_OR_DEPARTMENT_OTHER): Admitting: Pain Medicine

## 2023-08-01 DIAGNOSIS — Z91199 Patient's noncompliance with other medical treatment and regimen due to unspecified reason: Secondary | ICD-10-CM

## 2023-08-01 DIAGNOSIS — M7522 Bicipital tendinitis, left shoulder: Secondary | ICD-10-CM

## 2023-08-01 DIAGNOSIS — M25611 Stiffness of right shoulder, not elsewhere classified: Secondary | ICD-10-CM

## 2023-08-01 DIAGNOSIS — Z09 Encounter for follow-up examination after completed treatment for conditions other than malignant neoplasm: Secondary | ICD-10-CM

## 2023-08-01 DIAGNOSIS — M778 Other enthesopathies, not elsewhere classified: Secondary | ICD-10-CM

## 2023-08-01 DIAGNOSIS — G8929 Other chronic pain: Secondary | ICD-10-CM

## 2024-02-07 ENCOUNTER — Other Ambulatory Visit: Payer: Self-pay | Admitting: Family Medicine

## 2024-02-07 DIAGNOSIS — Z780 Asymptomatic menopausal state: Secondary | ICD-10-CM
